# Patient Record
Sex: Female | Born: 1955 | Race: White | Hispanic: No | Marital: Married | Smoking: Current every day smoker
Health system: Southern US, Community
[De-identification: ages and names within clinical notes are randomized; demographics above are authoritative.]

## PROBLEM LIST (undated history)

## (undated) DIAGNOSIS — I10 Essential (primary) hypertension: Secondary | ICD-10-CM

## (undated) DIAGNOSIS — E785 Hyperlipidemia, unspecified: Secondary | ICD-10-CM

## (undated) DIAGNOSIS — M1990 Unspecified osteoarthritis, unspecified site: Secondary | ICD-10-CM

## (undated) DIAGNOSIS — J069 Acute upper respiratory infection, unspecified: Secondary | ICD-10-CM

## (undated) DIAGNOSIS — F32A Depression, unspecified: Secondary | ICD-10-CM

## (undated) DIAGNOSIS — J189 Pneumonia, unspecified organism: Secondary | ICD-10-CM

## (undated) DIAGNOSIS — J449 Chronic obstructive pulmonary disease, unspecified: Secondary | ICD-10-CM

## (undated) DIAGNOSIS — F419 Anxiety disorder, unspecified: Secondary | ICD-10-CM

## (undated) DIAGNOSIS — F329 Major depressive disorder, single episode, unspecified: Secondary | ICD-10-CM

## (undated) HISTORY — PX: APPENDECTOMY: SHX54

## (undated) HISTORY — PX: TONSILLECTOMY: SUR1361

## (undated) HISTORY — PX: REDUCTION MAMMAPLASTY: SUR839

## (undated) HISTORY — PX: CERVICAL DISC SURGERY: SHX588

---
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
## 2005-01-26 ENCOUNTER — Emergency Department (HOSPITAL_COMMUNITY): Admission: EM | Admit: 2005-01-26 | Discharge: 2005-01-27 | Payer: Self-pay | Admitting: Emergency Medicine

## 2005-02-01 ENCOUNTER — Encounter: Payer: Self-pay | Admitting: Family Medicine

## 2005-02-01 ENCOUNTER — Other Ambulatory Visit: Admission: RE | Admit: 2005-02-01 | Discharge: 2005-02-01 | Payer: Self-pay | Admitting: Family Medicine

## 2005-02-01 ENCOUNTER — Ambulatory Visit: Payer: Self-pay | Admitting: Family Medicine

## 2005-02-04 ENCOUNTER — Ambulatory Visit: Payer: Self-pay | Admitting: Family Medicine

## 2005-03-01 ENCOUNTER — Ambulatory Visit: Payer: Self-pay | Admitting: Family Medicine

## 2005-03-15 ENCOUNTER — Ambulatory Visit: Payer: Self-pay | Admitting: Family Medicine

## 2005-09-27 ENCOUNTER — Ambulatory Visit (HOSPITAL_COMMUNITY): Admission: RE | Admit: 2005-09-27 | Discharge: 2005-09-27 | Payer: Self-pay | Admitting: Neurosurgery

## 2005-10-02 ENCOUNTER — Ambulatory Visit: Payer: Self-pay | Admitting: Family Medicine

## 2005-10-22 DIAGNOSIS — Z720 Tobacco use: Secondary | ICD-10-CM | POA: Insufficient documentation

## 2005-10-22 DIAGNOSIS — M1990 Unspecified osteoarthritis, unspecified site: Secondary | ICD-10-CM | POA: Insufficient documentation

## 2005-10-22 DIAGNOSIS — F410 Panic disorder [episodic paroxysmal anxiety] without agoraphobia: Secondary | ICD-10-CM | POA: Insufficient documentation

## 2005-10-22 DIAGNOSIS — F329 Major depressive disorder, single episode, unspecified: Secondary | ICD-10-CM | POA: Insufficient documentation

## 2005-10-22 DIAGNOSIS — F17200 Nicotine dependence, unspecified, uncomplicated: Secondary | ICD-10-CM | POA: Insufficient documentation

## 2005-10-22 DIAGNOSIS — F339 Major depressive disorder, recurrent, unspecified: Secondary | ICD-10-CM

## 2006-02-03 ENCOUNTER — Ambulatory Visit: Payer: Self-pay | Admitting: Family Medicine

## 2006-02-04 ENCOUNTER — Telehealth: Payer: Self-pay | Admitting: Family Medicine

## 2006-02-06 ENCOUNTER — Encounter: Payer: Self-pay | Admitting: Family Medicine

## 2006-02-06 ENCOUNTER — Telehealth: Payer: Self-pay | Admitting: Family Medicine

## 2006-02-07 ENCOUNTER — Telehealth: Payer: Self-pay | Admitting: Family Medicine

## 2006-02-12 ENCOUNTER — Telehealth: Payer: Self-pay | Admitting: Family Medicine

## 2006-04-01 ENCOUNTER — Telehealth: Payer: Self-pay | Admitting: Family Medicine

## 2007-01-26 ENCOUNTER — Ambulatory Visit (HOSPITAL_COMMUNITY): Admission: RE | Admit: 2007-01-26 | Discharge: 2007-01-26 | Payer: Self-pay | Admitting: Neurosurgery

## 2007-01-27 ENCOUNTER — Telehealth: Payer: Self-pay | Admitting: Family Medicine

## 2007-03-20 ENCOUNTER — Telehealth: Payer: Self-pay | Admitting: Family Medicine

## 2007-03-26 ENCOUNTER — Ambulatory Visit: Payer: Self-pay | Admitting: Family Medicine

## 2007-03-26 LAB — CONVERTED CEMR LAB
Bilirubin Urine: NEGATIVE
Blood in Urine, dipstick: NEGATIVE
Glucose, Urine, Semiquant: NEGATIVE
Ketones, urine, test strip: NEGATIVE
Nitrite: NEGATIVE
Specific Gravity, Urine: 1.01

## 2007-05-06 ENCOUNTER — Telehealth: Payer: Self-pay | Admitting: Family Medicine

## 2007-05-06 ENCOUNTER — Telehealth (INDEPENDENT_AMBULATORY_CARE_PROVIDER_SITE_OTHER): Payer: Self-pay | Admitting: *Deleted

## 2007-05-08 ENCOUNTER — Telehealth (INDEPENDENT_AMBULATORY_CARE_PROVIDER_SITE_OTHER): Payer: Self-pay | Admitting: *Deleted

## 2007-12-30 ENCOUNTER — Ambulatory Visit: Payer: Self-pay | Admitting: Family Medicine

## 2007-12-31 ENCOUNTER — Encounter: Payer: Self-pay | Admitting: Family Medicine

## 2008-01-01 LAB — CONVERTED CEMR LAB
BUN: 11 mg/dL (ref 6–23)
CO2: 23 meq/L (ref 19–32)
Calcium: 9.3 mg/dL (ref 8.4–10.5)
Creatinine, Ser: 0.77 mg/dL (ref 0.40–1.20)
Glucose, Bld: 117 mg/dL — ABNORMAL HIGH (ref 70–99)
TSH: 2.92 microintl units/mL (ref 0.350–4.50)

## 2008-02-11 ENCOUNTER — Encounter: Admission: RE | Admit: 2008-02-11 | Discharge: 2008-02-11 | Payer: Self-pay | Admitting: Family Medicine

## 2008-02-11 ENCOUNTER — Ambulatory Visit: Payer: Self-pay | Admitting: Family Medicine

## 2008-02-12 ENCOUNTER — Telehealth: Payer: Self-pay | Admitting: Family Medicine

## 2008-02-29 ENCOUNTER — Telehealth: Payer: Self-pay | Admitting: Family Medicine

## 2008-03-11 ENCOUNTER — Telehealth: Payer: Self-pay | Admitting: Family Medicine

## 2008-03-21 ENCOUNTER — Ambulatory Visit: Payer: Self-pay | Admitting: Family Medicine

## 2008-03-21 ENCOUNTER — Telehealth: Payer: Self-pay | Admitting: Family Medicine

## 2008-03-21 DIAGNOSIS — J309 Allergic rhinitis, unspecified: Secondary | ICD-10-CM | POA: Insufficient documentation

## 2008-03-28 ENCOUNTER — Ambulatory Visit: Payer: Self-pay | Admitting: Family Medicine

## 2008-04-08 ENCOUNTER — Telehealth: Payer: Self-pay | Admitting: Family Medicine

## 2008-04-18 ENCOUNTER — Telehealth: Payer: Self-pay | Admitting: Family Medicine

## 2008-04-19 ENCOUNTER — Ambulatory Visit: Payer: Self-pay | Admitting: Family Medicine

## 2008-10-11 ENCOUNTER — Ambulatory Visit: Payer: Self-pay | Admitting: Family Medicine

## 2008-10-11 DIAGNOSIS — R5383 Other fatigue: Secondary | ICD-10-CM

## 2008-10-11 DIAGNOSIS — R5381 Other malaise: Secondary | ICD-10-CM | POA: Insufficient documentation

## 2008-10-12 ENCOUNTER — Encounter: Payer: Self-pay | Admitting: Family Medicine

## 2008-10-13 LAB — CONVERTED CEMR LAB
ALT: 13 units/L (ref 0–35)
AST: 15 units/L (ref 0–37)
Albumin: 4.4 g/dL (ref 3.5–5.2)
Alkaline Phosphatase: 79 units/L (ref 39–117)
Calcium: 9.4 mg/dL (ref 8.4–10.5)
Cholesterol, target level: 200 mg/dL
Folate: 14 ng/mL
HDL: 60 mg/dL (ref >39)
LDL Goal: 160 mg/dL
Sodium: 140 meq/L (ref 135–145)
Total Protein: 6.3 g/dL (ref 6.0–8.3)
Triglycerides: 109 mg/dL (ref <150)
VLDL: 22 mg/dL (ref 0–40)

## 2008-11-15 ENCOUNTER — Ambulatory Visit: Payer: Self-pay | Admitting: Family Medicine

## 2009-02-15 ENCOUNTER — Ambulatory Visit: Payer: Self-pay | Admitting: Family Medicine

## 2009-05-04 ENCOUNTER — Ambulatory Visit: Payer: Self-pay | Admitting: Family Medicine

## 2009-06-15 ENCOUNTER — Ambulatory Visit: Payer: Self-pay | Admitting: Family Medicine

## 2009-06-15 DIAGNOSIS — J441 Chronic obstructive pulmonary disease with (acute) exacerbation: Secondary | ICD-10-CM | POA: Insufficient documentation

## 2009-09-13 ENCOUNTER — Ambulatory Visit: Payer: Self-pay | Admitting: Family Medicine

## 2009-09-13 DIAGNOSIS — M5136 Other intervertebral disc degeneration, lumbar region: Secondary | ICD-10-CM | POA: Insufficient documentation

## 2009-09-13 DIAGNOSIS — S335XXA Sprain of ligaments of lumbar spine, initial encounter: Secondary | ICD-10-CM | POA: Insufficient documentation

## 2009-09-13 DIAGNOSIS — I872 Venous insufficiency (chronic) (peripheral): Secondary | ICD-10-CM | POA: Insufficient documentation

## 2009-09-13 LAB — CONVERTED CEMR LAB
Blood in Urine, dipstick: NEGATIVE
Glucose, Urine, Semiquant: NEGATIVE
WBC Urine, dipstick: NEGATIVE

## 2009-09-15 LAB — CONVERTED CEMR LAB
ALT: 12 units/L (ref 0–35)
AST: 16 units/L (ref 0–37)
Albumin: 4.4 g/dL (ref 3.5–5.2)
CO2: 30 meq/L (ref 19–32)
Chloride: 105 meq/L (ref 96–112)
Creatinine, Ser: 0.74 mg/dL (ref 0.40–1.20)
HCT: 42.9 % (ref 36.0–46.0)
Hemoglobin: 14.2 g/dL (ref 12.0–15.0)
MCHC: 33.1 g/dL (ref 30.0–36.0)
RBC: 4.26 M/uL (ref 3.87–5.11)
RDW: 13.4 % (ref 11.5–15.5)
TSH: 2.547 microintl units/mL (ref 0.350–4.500)
Total Bilirubin: 0.4 mg/dL (ref 0.3–1.2)
Total Protein: 6.3 g/dL (ref 6.0–8.3)
WBC: 5.2 10*3/uL (ref 4.0–10.5)

## 2009-12-13 ENCOUNTER — Ambulatory Visit: Payer: Self-pay | Admitting: Family Medicine

## 2010-02-12 ENCOUNTER — Telehealth (INDEPENDENT_AMBULATORY_CARE_PROVIDER_SITE_OTHER): Payer: Self-pay | Admitting: *Deleted

## 2010-02-13 NOTE — Assessment & Plan Note (Signed)
Summary: ankles sweeling and lower back pain   Vital Signs:  Patient profile:   55 year old female Height:      65.5 inches Weight:      217 pounds Pulse rate:   52 / minute BP sitting:   121 / 76  (right arm) Cuff size:   regular  Vitals Entered By: Avon Gully CMA, Duncan Dull) (September 13, 2009 8:58 AM) CC: lower back pain since last wed, ankles swelling since Friday up on feet and its worse after being on feet, they hurt also   Primary Care Provider:  Nani Gasser MD  CC:  lower back pain since last wed, ankles swelling since Friday up on feet and its worse after being on feet, and they hurt also.  History of Present Illness: lower back pain since last wed, ankles swelling since Friday up on feet and its worse after being on feet, they hurt also.  Has been doing alot of walking lately and then last Thurs Friday her ankles were uncomfortable and ove the weekend was swelling.  Seeing an ortho for her left knee tomorrow.  Low back pain started yesterday. Bilat low back pain.  Pain is worse if she is standing alot. Better if sitting.  Pain can be a 12/10.  Pain is constant. No throbbing. No fever.  Thinks maybe more SOB with walking long distances.  No cough.  WEight is up 10 lbs in the last 2 months.  No CP.  No palpitations.   Current Medications (verified): 1)  Prozac 40 Mg Caps (Fluoxetine Hcl) .... Take 1 Tablet By Mouth Once A Day 2)  Proair Hfa 108 (90 Base) Mcg/act Aers (Albuterol Sulfate) .... 2-4 Puff Inhaled Every 4-6 Hours As Needed Sob 3)  Symbicort 80-4.5 Mcg/act Aero (Budesonide-Formoterol Fumarate) .... 2 Puffs Inhaled Two Times A Day 4)  Fluticasone Propionate 50 Mcg/act Susp (Fluticasone Propionate) .... 2 Sprays in Each Nostril Daily  Allergies (verified): 1)  Pcn  Comments:  Nurse/Medical Assistant: The patient's medications and allergies were reviewed with the patient and were updated in the Medication and Allergy Lists. Avon Gully CMA, Duncan Dull)  (September 13, 2009 9:01 AM)  Past History:  Past Surgical History: Last updated: 12/30/2007 Ankle sx  1973, c/sec 1976, Left groin Hernia  1974 Cervical disc surgery (?laminectomy?)   Physical Exam  General:  Well-developed,well-nourished,in no acute distress; alert,appropriate and cooperative throughout examination Head:  Normocephalic and atraumatic without obvious abnormalities. No apparent alopecia or balding. Neck:  No deformities, masses, or tenderness noted. Lungs:  Normal respiratory effort, chest expands symmetrically. Lungs are clear to auscultation, no crackles or wheezes. Heart:  Normal rate and regular rhythm. S1 and S2 normal without gallop, murmur, click, rub or other extra sounds. No carotid bruits.  Msk:  Normal flexion, extension, side bending of the low back. [place]edner over the lumbar spine.  Tender over teh lumbar paraspinous muscles.  Mild bilat CVA tenderness.  Extremities:  1 + edema to below the left knee. trace edema in the right ankle.  Neurologic:  1+ reflexes in UE and LE.  Skin:  no rashes.   Cervical Nodes:  No lymphadenopathy noted Psych:  Cognition and judgment appear intact. Alert and cooperative with normal attention span and concentration. No apparent delusions, illusions, hallucinations   Impression & Recommendations:  Problem # 1:  UNSPECIFIED VENOUS INSUFFICIENCY (ICD-459.81) Assessment New With acute onset will rule out electrolyte d/o, CHF, thyroid abnormalities, liver and kidney problems, no UTI. For now avoid salt,  elevate feet when can and will give lasix to use for the next 3 days and see if feeling better. likley VS worsening by prolonged standing and the summer heat.   Orders: T-Comprehensive Metabolic Panel [id number]) T-TSH [phone]) T-CBC No Diff ([id number]) T-BNP  (B Natriuretic Peptide) (20254-27062)  Problem # 2:  LUMBAR STRAIN (ICD-847.2) Assessment: New I really think her back pain is separate. UA is neg.  Trial of  muscle relaxer and stretches to see if helps.   Orders: UA Dipstick w/o Micro (automated)  (81003)  Complete Medication List: 1)  Prozac 40 Mg Caps (Fluoxetine hcl) .... Take 1 tablet by mouth once a day 2)  Proair Hfa 108 (90 Base) Mcg/act Aers (Albuterol sulfate) .... 2-4 puff inhaled every 4-6 hours as needed sob 3)  Symbicort 80-4.5 Mcg/act Aero (Budesonide-formoterol fumarate) .... 2 puffs inhaled two times a day 4)  Fluticasone Propionate 50 Mcg/act Susp (Fluticasone propionate) .... 2 sprays in each nostril daily 5)  Flexeril 10 Mg Tabs (Cyclobenzaprine hcl) .... Take 1 tablet by mouth two times a day as needed muscle pain 6)  Lasix 40 Mg Tabs (Furosemide) .... Take 1 tablet by mouth once a day as needed swelling  Patient Instructions: 1)  Trial of muscle relaxer and stretches to see if helps.  2)  We will call you with the lab results. Prescriptions: LASIX 40 MG TABS (FUROSEMIDE) Take 1 tablet by mouth once a day as needed swelling  #10 x 0   Entered and Authorized by:   Nani Gasser MD   Signed by:   Nani Gasser MD on 09/13/2009   Method used:   Electronically to        [facility] [phone]* (retail)       [address]       [place], [place]  [address]       Ph: [phone]       Fax: [phone]   RxID:   [id number] FLEXERIL 10 MG TABS (CYCLOBENZAPRINE HCL) Take 1 tablet by mouth two times a day as needed muscle pain  #30 x 0   Entered and Authorized by:   Nani Gasser MD   Signed by:   Nani Gasser MD on 09/13/2009   Method used:   Electronically to        [facility] [phone]* (retail)       [address]       [place], [place]  [address]       Ph: [phone]       Fax: [phone]   RxID:   [id number]   Laboratory Results   Urine Tests  Date/Time Received: 09/13/2009 Date/Time Reported: 09/13/2009  Routine Urinalysis   Color: yellow Appearance: Clear Glucose: negative   (Normal Range: Negative) Bilirubin:  negative   (Normal Range: Negative) Ketone: negative   (Normal Range: Negative) Spec. Gravity: 1.015   (Normal Range: 1.003-1.035) Blood: negative   (Normal Range: Negative) pH: 8.5   (Normal Range: 5.0-8.0) Protein: negative   (Normal Range: Negative) Urobilinogen: 0.2   (Normal Range: 0-1) Nitrite: negative   (Normal Range: Negative) Leukocyte Esterace: negative   (Normal Range: Negative)

## 2010-02-13 NOTE — Assessment & Plan Note (Signed)
Summary: URI   Vital Signs:  Patient profile:   55 year old female Height:      65.5 inches Weight:      200 pounds BMI:     32.89 O2 Sat:      96 % on Room air Temp:     98.2 degrees F oral Pulse rate:   67 / minute BP sitting:   126 / 80  (left arm) Cuff size:   large  Vitals Entered By: Kathlene November (February 15, 2009 9:42 AM)  O2 Flow:  Room air  Serial Vital Signs/Assessments:                                PEF    PreRx  PostRx Time      O2 Sat  O2 Type     L/min  L/min  L/min   By 10:08 AM                      280    280    280     Kim Johnson 10:20 AM                      270    310    310     Kim Johnson  Comments: 10:08 AM pt in yellow zone By: Kathlene November  10:20 AM Pt in yellow zone By: Kathlene November   CC: sinus congestion, chest congestion, cough, H/A, eyes itchy and watering. Started 2 days ago   Primary Care Provider:  Linford Arnold, C  CC:  sinus congestion, chest congestion, cough, H/A, and eyes itchy and watering. Started 2 days ago.  History of Present Illness: sinus congestion, chest congestion, cough, H/A, eyes itchy and watering. Started 4-5 days ago. Chest congestion started today. Productive cough.  No fever.  Some bodyaches. + flu exposure but she had her flu shot.  taking Advil cold and sinus.  Started with right ear pain and maxillary sinus pain.  Hasn't smoked in 3 weeks. Feels extremely tired.  Restarted her symbicort and nasal spray 5 days ago. Treated for bronchitis for November.   Current Medications (verified): 1)  Prozac 40 Mg Caps (Fluoxetine Hcl) .... Take 1 Tablet By Mouth Once A Day 2)  Proair Hfa 108 (90 Base) Mcg/act Aers (Albuterol Sulfate) .... 2-4 Puff Inhaled Every 4-6 Hours As Needed Sob 3)  Trazodone Hcl 50 Mg Tabs (Trazodone Hcl) .... Take 1 Tablet By Mouth Once A Day At Bedtime For Insomnia 4)  Symbicort 80-4.5 Mcg/act Aero (Budesonide-Formoterol Fumarate) .... 2 Puffs Inhaled Two Times A Day 5)  Fluticasone Propionate 50  Mcg/act Susp (Fluticasone Propionate) .... 2 Sprays in Each Nostril Daily 6)  Naproxen 500 Mg Tabs (Naproxen) .... Take 1 Tablet By Mouth Two Times A Day 7)  Flexeril 10 Mg Tabs (Cyclobenzaprine Hcl) .... Take 1 Tablet By Mouth Three Times A Day As Needed For Muscle Spams.  Allergies (verified): 1)  Pcn  Comments:  Nurse/Medical Assistant: The patient's medications and allergies were reviewed with the patient and were updated in the Medication and Allergy Lists. Kathlene November (February 15, 2009 9:44 AM)  Social History: Reviewed history from 12/30/2007 and no changes required. 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  Married to [place].  Lives with husband, mother, and grandson.  Smokes 1/2 ppd for 35 yrs.  No EtOH, no drugs, 3  caffeinated drinks, no regular exercise.  Physical Exam  General:  Well-developed,well-nourished,in no acute distress; alert,appropriate and cooperative throughout examination. Appears fatigued.  Head:  Normocephalic and atraumatic without obvious abnormalities. No apparent alopecia or balding. Eyes:  Sclera are inflammed. Lids wih midl edema. Clear runny nose.  Ears:  External ear exam shows no significant lesions or deformities.  Otoscopic examination reveals clear canals, tympanic membranes are intact bilaterally without bulging, retraction, inflammation or discharge. Hearing is grossly normal bilaterally. Nose:  External nasal examination shows no deformity or inflammation. Mouth:  Oral mucosa and oropharynx without lesions or exudates.  Teeth in good repair. Neck:  No deformities, masses, or tenderness noted. Lungs:  Normal respiratory effort, chest expands symmetrically. Prolonged expiration at the bases bilat with some wheeze.  Much improved post neb.  Heart:  Normal rate and regular rhythm. S1 and S2 normal without gallop, murmur, click, rub or other extra sounds. Pulses:  Radial 2+  Neurologic:  alert & oriented X3.   Skin:  no rashes.   Cervical  Nodes:  No lymphadenopathy noted Psych:  Cognition and judgment appear intact. Alert and cooperative with normal attention span and concentration. No apparent delusions, illusions, hallucinations   Impression & Recommendations:  Problem # 1:  ACUTE BRONCHITIS (ICD-466.0) Still possiblly viral. Try to hold off on abx for a couple of days to see fif feels better.  New rx given for alubterol vials. Use neb up to 3 x a day as needed for SOB or chest tightness. Symptomatic care. Continue the sybicort and proair as needed . Fill rx for ABX if not better in a couple of days. Still need to schedule  spirometry when feeling better.  Her updated medication list for this problem includes:    Proair Hfa 108 (90 Base) Mcg/act Aers (Albuterol sulfate) .Marland Kitchen... 2-4 puff inhaled every 4-6 hours as needed sob    Symbicort 80-4.5 Mcg/act Aero (Budesonide-formoterol fumarate) .Marland Kitchen... 2 puffs inhaled two times a day    Doxycycline Hyclate 100 Mg Tabs (Doxycycline hyclate) .Marland Kitchen... Take 1 tablet by mouth two times a day for 10 days    Albuterol Sulfate (2.5 Mg/51ml) 0.083% Nebu (Albuterol sulfate) ..... One neb eveyr 4-6 hours as needed  Orders: Albuterol Sulfate Sol 1mg  unit dose (I9485) Nebulizer Tx (46270) Peak Flow Rate ([address])  Take antibiotics and other medications as directed. Encouraged to push clear liquids, get enough rest, and take acetaminophen as needed. To be seen in 5-7 days if no improvement, sooner if worse.  Complete Medication List: 1)  Prozac 40 Mg Caps (Fluoxetine hcl) .... Take 1 tablet by mouth once a day 2)  Proair Hfa 108 (90 Base) Mcg/act Aers (Albuterol sulfate) .... 2-4 puff inhaled every 4-6 hours as needed sob 3)  Trazodone Hcl 50 Mg Tabs (Trazodone hcl) .... Take 1 tablet by mouth once a day at bedtime for insomnia 4)  Symbicort 80-4.5 Mcg/act Aero (Budesonide-formoterol fumarate) .... 2 puffs inhaled two times a day 5)  Fluticasone Propionate 50 Mcg/act Susp (Fluticasone propionate) ....  2 sprays in each nostril daily 6)  Naproxen 500 Mg Tabs (Naproxen) .... Take 1 tablet by mouth two times a day 7)  Flexeril 10 Mg Tabs (Cyclobenzaprine hcl) .... Take 1 tablet by mouth three times a day as needed for muscle spams. 8)  Doxycycline Hyclate 100 Mg Tabs (Doxycycline hyclate) .... Take 1 tablet by mouth two times a day for 10 days 9)  Albuterol Sulfate (2.5 Mg/73ml) 0.083% Nebu (Albuterol sulfate) .... One  neb eveyr 4-6 hours as needed Prescriptions: DOXYCYCLINE HYCLATE 100 MG TABS (DOXYCYCLINE HYCLATE) Take 1 tablet by mouth two times a day for 10 days  #20 x 0   Entered and Authorized by:   Nani Gasser MD   Signed by:   Nani Gasser MD on 02/15/2009   Method used:   Electronically to        [facility] [phone]* (retail)       [address]       [place], [place]  [address]       Ph: [phone]       Fax: [phone]   RxID:   [id number] ALBUTEROL SULFATE (2.5 MG/3ML) 0.083% NEBU (ALBUTEROL SULFATE) one NEB eveyr 4-6 hours as needed  #30 day suppl x 0   Entered and Authorized by:   Nani Gasser MD   Signed by:   Nani Gasser MD on 02/15/2009   Method used:   Electronically to        [facility] [phone]* (retail)       [address]       [place], [place]  [address]       Ph: [phone]       Fax: [phone]   RxID:   [id number]    Medication Administration  Medication # 1:    Medication: Albuterol Sulfate Sol 1mg  unit dose    Diagnosis: ACUTE BRONCHITIS (ICD-466.0)    Dose: 2.5mg     Route: inhaled    Exp Date: 09/15/2010    Lot #: [id number]    Mfr: nephropn    Patient tolerated medication without complications    Given by: Kathlene November (February 15, 2009 10:17 AM)  Orders Added: 1)  Albuterol Sulfate Sol 1mg  unit dose [J7613] 2)  Nebulizer Tx [94640] 3)  Est. Patient Level IV [09323] 4)  Peak Flow Rate [94150]

## 2010-02-13 NOTE — Assessment & Plan Note (Signed)
Summary: URI/ wheezing   Vital Signs:  Patient profile:   55 year old female Height:      65.5 inches Weight:      207 pounds BMI:     34.05 O2 Sat:      95 % on Room air Pulse rate:   59 / minute BP sitting:   123 / 73  (left arm) Cuff size:   large  Vitals Entered By: Payton Spark CMA (June 15, 2009 11:45 AM)  O2 Flow:  Room air  Serial Vital Signs/Assessments:  Comments: 11:49 AM Peak Flow [address] By: Payton Spark CMA   CC: B arms and hands cramping x 3 days. Also has cough w/ wheeze.    Primary Care Provider:  Nani Gasser MD  CC:  B arms and hands cramping x 3 days. Also has cough w/ wheeze. Marland Kitchen  History of Present Illness: 54 yo WF presents for 3 days of cough and congestion.  She is using her inhaler and her nasal sprays but it is not getting better.  She is having cramping in her arms and hands and also her legs.  She had a normal BMP last visit.  She is drinking lots of fluids.  She is not on a diuretic.  Has some allergies and undiagnosed COPD.  She has been taking Advil Cold and sinus but not much relief for nasal congestion, rhinorrhea or HAs.  Denies fevers, chills, sputum production, CP or SOB.    Current Medications (verified): 1)  Prozac 40 Mg Caps (Fluoxetine Hcl) .... Take 1 Tablet By Mouth Once A Day 2)  Proair Hfa 108 (90 Base) Mcg/act Aers (Albuterol Sulfate) .... 2-4 Puff Inhaled Every 4-6 Hours As Needed Sob 3)  Symbicort 80-4.5 Mcg/act Aero (Budesonide-Formoterol Fumarate) .... 2 Puffs Inhaled Two Times A Day 4)  Fluticasone Propionate 50 Mcg/act Susp (Fluticasone Propionate) .... 2 Sprays in Each Nostril Daily  Allergies (verified): 1)  Pcn  Past History:  Past Medical History: Reviewed history from 12/30/2007 and no changes required. Cervical Stenosis C4-T1, G1 P1   Social History: Reviewed history from 12/30/2007 and no changes required. 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  Lives with  husband, mother, and grandson.  Smokes 1/2 ppd for 35 yrs.  No EtOH, no drugs, 3 caffeinated drinks, no regular exercise.  Review of Systems      See HPI  Physical Exam  General:  alert, well-developed, well-nourished, and well-hydrated.  obese Head:  normocephalic and atraumatic.  maxillary sinuses TTP Eyes:  conjunctiva mildly injected and watery Nose:  clear rhinorrhea Mouth:  o/p pink and moist with cobblestoning and clear postnasal drip Neck:  no masses.   Lungs:  rhonchi/ exp wheeze bibasilar with cough.  nonlabored. Heart:  normal rate, regular rhythm, and no murmur.   Extremities:  no LE edema Skin:  color normal.   Cervical Nodes:  No lymphadenopathy noted Psych:  good eye contact, not anxious appearing, and not depressed appearing.     Impression & Recommendations:  Problem # 1:  CHRONIC OBSTRUCTIVE PULMONARY DISEASE, ACUTE EXACERBATION (ICD-491.21)  Secondary to URI, smoker with undiagnosed COPD. Treat with Prednisone 60 mg/ day x 5 days + Zithromax x 5 days. Use Symbicort two times a day and ProAir HFA 2 puffs 4 x a day. She did quit smoking 2 mos ago.    Call if not improved in 7 days.  Orders: Peak Flow Rate ([address])  Problem # 2:  URI (ICD-465.9) OK to use OTC Delsym for cough with Zyrtec - D  for congestion.  Complete Medication List: 1)  Prozac 40 Mg Caps (Fluoxetine hcl) .... Take 1 tablet by mouth once a day 2)  Proair Hfa 108 (90 Base) Mcg/act Aers (Albuterol sulfate) .... 2-4 puff inhaled every 4-6 hours as needed sob 3)  Symbicort 80-4.5 Mcg/act Aero (Budesonide-formoterol fumarate) .... 2 puffs inhaled two times a day 4)  Fluticasone Propionate 50 Mcg/act Susp (Fluticasone propionate) .... 2 sprays in each nostril daily 5)  Prednisone 20 Mg Tabs (Prednisone) .... 3 tabs by mouth once a day x 5 days 6)  Zithromax Z-pak 250 Mg Tabs (Azithromycin) .... 2 tab by mouth x 1 day then 1 tab by mouth daily x 4 days  Patient Instructions: 1)  Take 5 days of  Zithromax + 5 days of Prednisone. 2)  Stay on Symbicort 2 x a day. 3)  Use ProAir 2 puffs 4 x a day for the next 10 days. 4)  Use Zyrtec - D  once a day for allergies and congestion along with Delsym as needed for cough. 5)  Call if not improving in 7 days. Prescriptions: ZITHROMAX Z-PAK 250 MG TABS (AZITHROMYCIN) 2 tab by mouth x 1 day then 1 tab by mouth daily x 4 days  #1 pack x 0   Entered and Authorized by:   Seymour Bars DO   Signed by:   Seymour Bars DO on 06/15/2009   Method used:   Electronically to        [facility] [phone]* (retail)       [address]       [place], [place]  [address]       Ph: [phone]       Fax: [phone]   RxID:   [id number] PREDNISONE 20 MG TABS (PREDNISONE) 3 tabs by mouth once a day x 5 days  #15 x 0   Entered and Authorized by:   Seymour Bars DO   Signed by:   Seymour Bars DO on 06/15/2009   Method used:   Electronically to        [facility] [phone]* (retail)       [address]       [place], [place]  [address]       Ph: [phone]       Fax: [phone]   RxID:   [id number]

## 2010-02-13 NOTE — Assessment & Plan Note (Signed)
Summary: POISON OAK   Vital Signs:  Patient profile:   55 year old female Height:      65.5 inches Weight:      204 pounds BMI:     33.55 O2 Sat:      95 % on Room air Pulse rate:   80 / minute BP sitting:   136 / 87  (left arm) Cuff size:   large  Vitals Entered By: Payton Spark CMA (May 04, 2009 10:35 AM)  O2 Flow:  Room air CC: ? Poison Oak around eyes x 2 days.   Primary Care Provider:  Cipriano Bunker  CC:  ? Poison Oak around eyes x 2 days.Marland Kitchen  History of Present Illness: ? Poison Oak around eyes x 2 days.  Current Medications (verified): 1)  Prozac 40 Mg Caps (Fluoxetine Hcl) .... Take 1 Tablet By Mouth Once A Day 2)  Proair Hfa 108 (90 Base) Mcg/act Aers (Albuterol Sulfate) .... 2-4 Puff Inhaled Every 4-6 Hours As Needed Sob 3)  Symbicort 80-4.5 Mcg/act Aero (Budesonide-Formoterol Fumarate) .... 2 Puffs Inhaled Two Times A Day 4)  Fluticasone Propionate 50 Mcg/act Susp (Fluticasone Propionate) .... 2 Sprays in Each Nostril Daily 5)  Albuterol Sulfate (2.5 Mg/84ml) 0.083% Nebu (Albuterol Sulfate) .... One [employer] 4-6 Hours As Needed  Allergies (verified): 1)  Pcn  Physical Exam  General:  Well-developed,well-nourished,in no acute distress; alert,appropriate and cooperative throughout examination Skin:  Erythematous papules  in a linear fashion on her forehead and on her eyelids.  Has a few erythematous papules on her forearms as well.    Impression & Recommendations:  Problem # 1:  POISON IVY DERMATITIS (ICD-692.6)  Discussed options of no treatment as it will resolve on its own. vs steroid injection. I don't recommend topical steroid as it is on her eyelids and it is not safe to apply this around the eyes.  Pt opted for injection as says the itching it driving her crazy. Depo medrol 60mg  IM given.  Pt to call if sxs start to recur after 5 days.   Orders: Depo- Medrol 40mg  (J1030) Depo-Medrol 20mg  (J1020) Admin of Therapeutic Inj  intramuscular or  subcutaneous (91478)  Complete Medication List: 1)  Prozac 40 Mg Caps (Fluoxetine hcl) .... Take 1 tablet by mouth once a day 2)  Proair Hfa 108 (90 Base) Mcg/act Aers (Albuterol sulfate) .... 2-4 puff inhaled every 4-6 hours as needed sob 3)  Symbicort 80-4.5 Mcg/act Aero (Budesonide-formoterol fumarate) .... 2 puffs inhaled two times a day 4)  Fluticasone Propionate 50 Mcg/act Susp (Fluticasone propionate) .... 2 sprays in each nostril daily 5)  Albuterol Sulfate (2.5 Mg/43ml) 0.083% Nebu (Albuterol sulfate) .... One neb eveyr 4-6 hours as needed  Patient Instructions: 1)  Call if sxs recur in 5 days 2)  Can use OTC Benadryl   Medication Administration  Injection # 1:    Medication: Depo- Medrol 40mg     Diagnosis: POISON IVY DERMATITIS (ICD-692.6)    Route: IM    Site: LUOQ gluteus    Patient tolerated injection without complications    Given by: Payton Spark CMA (May 04, 2009 10:58 AM)  Injection # 2:    Medication: Depo-Medrol 20mg     Diagnosis: POISON IVY DERMATITIS (ICD-692.6)  Orders Added: 1)  Est. Patient Level III [29562] 2)  Depo- Medrol 40mg  [J1030] 3)  Depo-Medrol 20mg  [J1020] 4)  Admin of Therapeutic Inj  intramuscular or subcutaneous [13086]

## 2010-02-13 NOTE — Assessment & Plan Note (Signed)
Summary: Sinusitis, COPD   Vital Signs:  Patient profile:   55 year old female Height:      65.5 inches Weight:      214 pounds Temp:     98.2 degrees F oral Pulse rate:   77 / minute BP sitting:   134 / 87  (right arm) Cuff size:   regular  Vitals Entered By: Avon Gully CMA, Duncan Dull) (December 13, 2009 9:45 AM) CC: congestion since monday,fever last pm 101   Primary Care Provider:  Nani Gasser MD  CC:  congestion since monday and fever last pm 101.  History of Present Illness: Nasal congestion since monday,fever last pm 101.  No sxs last week.  Had diarrhea and stomach cramping about 5 days ago and that is better.  Feels weak and puny.  Head is painful to lay flat at night.  Pain at the base of the skull and across the eyes. No tooth pain. Throbbong pain in face when bends to tie her shoe.  Can't hear out of her right ear.  No ST or cough. Feels achy all over.  Started using her nasal spray and inhaler.  Also took some mucinex D but not really helping.No SOB.    Current Medications (verified): 1)  Prozac 40 Mg Caps (Fluoxetine Hcl) .... Take 1 Tablet By Mouth Once A Day 2)  Proair Hfa 108 (90 Base) Mcg/act Aers (Albuterol Sulfate) .... 2-4 Puff Inhaled Every 4-6 Hours As Needed Sob 3)  Symbicort 80-4.5 Mcg/act Aero (Budesonide-Formoterol Fumarate) .... 2 Puffs Inhaled Two Times A Day 4)  Fluticasone Propionate 50 Mcg/act Susp (Fluticasone Propionate) .... 2 Sprays in Each Nostril Daily 5)  Flexeril 10 Mg Tabs (Cyclobenzaprine Hcl) .... Take 1 Tablet By Mouth Two Times A Day As Needed Muscle Pain  Allergies (verified): 1)  Pcn  Comments:  Nurse/Medical Assistant: The patient's medications and allergies were reviewed with the patient and were updated in the Medication and Allergy Lists. Avon Gully CMA, Duncan Dull) (December 13, 2009 9:46 AM)  Physical Exam  General:  Well-developed,well-nourished,in no acute distress; alert,appropriate and cooperative throughout  examination Head:  Normocephalic and atraumatic without obvious abnormalities. No apparent alopecia or balding. Eyes:  No corneal or conjunctival inflammation noted. EOMI. Perrla.  Ears:  External ear exam shows no significant lesions or deformities.  Otoscopic examination reveals clear canals, tympanic membranes are intact bilaterally without bulging, retraction, inflammation or discharge. Hearing is grossly normal bilaterally. Nose:  External nasal examination shows no deformity or inflammation. Nasal mucosa are pink and moist without lesions or exudates. Mouth:  Oral mucosa and oropharynx without lesions or exudates.  Teeth in good repair. Neck:  No deformities, masses, or tenderness noted. Lungs:  Normal respiratory effort, chest expands symmetrically. Lungs are clear to auscultation,Wheeze on the right lung. Didn't clear with cough.  Heart:  Normal rate and regular rhythm. S1 and S2 normal without gallop, murmur, click, rub or other extra sounds.   Impression & Recommendations:  Problem # 1:  SINUSITIS - ACUTE-NOS (ICD-461.9)  I suspect this is viral but since having 3 days of fever gave her a rx. Recommend she give this 1-2 days to see if the fever breaks before deciding to fill the ABX. Call if not better in one week.  Her updated medication list for this problem includes:    Fluticasone Propionate 50 Mcg/act Susp (Fluticasone propionate) .Marland Kitchen... 2 sprays in each nostril daily    Doxycycline Hyclate 100 Mg Caps (Doxycycline hyclate) .Marland Kitchen... Take 1 tablet  by mouth two times a day for 10 days  Instructed on treatment. Call if symptoms persist or worsen.   Problem # 2:  CHRONIC OBSTRUCTIVE PULMONARY DISEASE, ACUTE EXACERBATION (ICD-491.21) I do hear a wheeze on the right side so I do recommend using her Albuterol three times a day for a few days. Call if any Chest Pain, SOB, or cough develops., or if fever doesn't resolved.   Complete Medication List: 1)  Prozac 40 Mg Caps (Fluoxetine hcl)  .... Take 1 tablet by mouth once a day 2)  Proair Hfa 108 (90 Base) Mcg/act Aers (Albuterol sulfate) .... 2-4 puff inhaled every 4-6 hours as needed sob 3)  Symbicort 80-4.5 Mcg/act Aero (Budesonide-formoterol fumarate) .... 2 puffs inhaled two times a day 4)  Fluticasone Propionate 50 Mcg/act Susp (Fluticasone propionate) .... 2 sprays in each nostril daily 5)  Flexeril 10 Mg Tabs (Cyclobenzaprine hcl) .... Take 1 tablet by mouth two times a day as needed muscle pain 6)  Doxycycline Hyclate 100 Mg Caps (Doxycycline hyclate) .... Take 1 tablet by mouth two times a day for 10 days  Patient Instructions: 1)  Call if not better in one week.  Prescriptions: PROZAC 40 MG CAPS (FLUOXETINE HCL) Take 1 tablet by mouth once a day  #90 x 0   Entered and Authorized by:   Nani Gasser MD   Signed by:   Nani Gasser MD on 12/13/2009   Method used:   Electronically to        [facility] [phone]* (retail)       [address]       [place], [place]  [address]       Ph: [phone]       Fax: [phone]   RxID:   [id number] DOXYCYCLINE HYCLATE 100 MG CAPS (DOXYCYCLINE HYCLATE) Take 1 tablet by mouth two times a day for 10 days  #20 x 0   Entered and Authorized by:   Nani Gasser MD   Signed by:   Nani Gasser MD on 12/13/2009   Method used:   Electronically to        [facility] [phone]* (retail)       [address]       [place], [place]  [address]       Ph: [phone]       Fax: [phone]   RxID:   [id number] FLUTICASONE PROPIONATE 50 MCG/ACT SUSP (FLUTICASONE PROPIONATE) 2 sprays in each nostril daily  #1 x 4   Entered and Authorized by:   Nani Gasser MD   Signed by:   Nani Gasser MD on 12/13/2009   Method used:   Electronically to        [facility] [phone]* (retail)       [address]       [place], [place]  [address]       Ph: [phone]       Fax: [phone]   RxID:   [id number] PROAIR HFA 108 (90 BASE)  MCG/ACT AERS (ALBUTEROL SULFATE) 2-4 puff inhaled every 4-6 hours as needed SOB  #1 x 1   Entered and Authorized by:   Nani Gasser MD   Signed by:   Nani Gasser MD on 12/13/2009   Method used:   Electronically to        [facility] [phone]* (retail)       [address]       [place],  [place]  [address]       Ph: [phone]       Fax: [phone]   RxID:   [id number] SYMBICORT 80-4.5 MCG/ACT AERO (BUDESONIDE-FORMOTEROL FUMARATE) 2 puffs inhaled two times a day  #1 x 4   Entered and Authorized by:   Nani Gasser MD   Signed by:   Nani Gasser MD on 12/13/2009   Method used:   Electronically to        [facility] [phone]* (retail)       [address]       [place], [place]  [address]       Ph: [phone]       Fax: [phone]   RxID:   [id number]    Orders Added: 1)  Est. Patient Level IV [[address]]

## 2010-02-21 NOTE — Progress Notes (Signed)
Summary: Due for mammo  Phone Note Outgoing Call   Summary of Call: Call pt and remind her she is due for her mammogarm.  Initial call taken by: Nani Gasser MD,  February 12, 2010 10:25 AM  Follow-up for Phone Call        Pt aware of the above Follow-up by: Payton Spark CMA,  February 13, 2010 9:47 AM

## 2010-03-01 ENCOUNTER — Encounter: Payer: Self-pay | Admitting: Family Medicine

## 2010-03-01 ENCOUNTER — Telehealth: Payer: Self-pay | Admitting: Family Medicine

## 2010-03-01 ENCOUNTER — Ambulatory Visit (INDEPENDENT_AMBULATORY_CARE_PROVIDER_SITE_OTHER): Payer: Commercial Indemnity | Admitting: Family Medicine

## 2010-03-01 DIAGNOSIS — J019 Acute sinusitis, unspecified: Secondary | ICD-10-CM

## 2010-03-01 DIAGNOSIS — F339 Major depressive disorder, recurrent, unspecified: Secondary | ICD-10-CM

## 2010-03-07 NOTE — Progress Notes (Signed)
Summary: meds  Phone Note Call from Patient   Caller: Patient Call For: Nani Gasser MD Summary of Call: pt called and states the serequel is too expensive and wants to know if something cheaper can be called in Initial call taken by: Avon Gully CMA, Duncan Dull),  March 01, 2010 3:14 PM  Follow-up for Phone Call        we will change her to Prozac 80 mg.  This should be much more affordable. Follow-up by: Nani Gasser MD,  March 01, 2010 5:26 PM  Additional Follow-up for Phone Call Additional follow up Details #1::        pt notifed Additional Follow-up by: Avon Gully CMA, Duncan Dull),  March 02, 2010 10:14 AM    New/Updated Medications: PROZAC 40 MG CAPS (FLUOXETINE HCL) Take 2  tablet by mouth once a day Prescriptions: PROZAC 40 MG CAPS (FLUOXETINE HCL) Take 2  tablet by mouth once a day  #60 x 0   Entered and Authorized by:   Nani Gasser MD   Signed by:   Nani Gasser MD on 03/01/2010   Method used:   Electronically to        [facility] [phone]* (retail)       [address]       [place], [place]  [address]       Ph: [phone]       Fax: [phone]   RxID:   [id number]

## 2010-03-07 NOTE — Assessment & Plan Note (Signed)
Summary: Sinusitis, depresson   Vital Signs:  Patient profile:   55 year old female Height:      65.5 inches Weight:      209 pounds Temp:     98.6 degrees F oral Pulse rate:   71 / minute BP sitting:   135 / 84  (right arm) Cuff size:   regular  Vitals Entered By: Avon Gully CMA, Duncan Dull) (March 01, 2010 10:56 AM) CC: sinus pressure and congestion on and off since NOv   Primary Care Provider:  Nani Gasser MD  CC:  sinus pressure and congestion on and off since NOv.  History of Present Illness: Has lost 5 lbs.  sinus pressure and congestion on and off since NOv. Aslo have pain in both jaw and soreness in her teeth. No fever. Also still some cough adn occ sounds wet.  NOt taking anything for it right now. No alleviating or worsening factors.   Mom has dx with alzheimers and dementia and her son moved back home so this is very stressful.  She is still taking her prozac at night.  Sleep is ok but hard time falling asleep. Feels she needs somthing else for her mood.  Says emotionally she is really struggling and is tearful. Says her sister is getting counseling and this has been helpful for her.   Current Medications (verified): 1)  Prozac 40 Mg Caps (Fluoxetine Hcl) .... Take 1 Tablet By Mouth Once A Day 2)  Proair Hfa 108 (90 Base) Mcg/act Aers (Albuterol Sulfate) .... 2-4 Puff Inhaled Every 4-6 Hours As Needed Sob 3)  Symbicort 80-4.5 Mcg/act Aero (Budesonide-Formoterol Fumarate) .... 2 Puffs Inhaled Two Times A Day 4)  Fluticasone Propionate 50 Mcg/act Susp (Fluticasone Propionate) .... 2 Sprays in Each Nostril Daily  Allergies (verified): 1)  Pcn  Comments:  Nurse/Medical Assistant: The patient's medications and allergies were reviewed with the patient and were updated in the Medication and Allergy Lists. Avon Gully CMA, Duncan Dull) (March 01, 2010 10:57 AM)  Family History:  Father-alcoholism, DM Mother - HTN, depresssion, alzheimers  pancreatic  cancer-GM  Social History: Reviewed history from 12/30/2007 and no changes required. 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  Married to [place].  Lives with husband, mother, and grandson.  Smokes 1/2 ppd for 35 yrs.  No EtOH, no drugs, 3 caffeinated drinks, no regular exercise.  Physical Exam  General:  Well-developed,well-nourished,in no acute distress; alert,appropriate and cooperative throughout examination Head:  Normocephalic and atraumatic without obvious abnormalities. No apparent alopecia or balding. Eyes:  No corneal or conjunctival inflammation noted. EOMI. Perrla. Ears:  External ear exam shows no significant lesions or deformities.  Otoscopic examination reveals clear canals, tympanic membranes are intact bilaterally without bulging, retraction, inflammation or discharge. Hearing is grossly normal bilaterally. Nose:  External nasal examination shows no deformity or inflammation. Mouth:  Oral mucosa and oropharynx without lesions or exudates.  Teeth in good repair. Neck:  No deformities, masses, or tenderness noted. Lungs:  Normal respiratory effort, chest expands symmetrically. Coarse wheezing at the bases bilaterally.  Heart:  Normal rate and regular rhythm. S1 and S2 normal without gallop, murmur, click, rub or other extra sounds. Skin:  no rashes.   Cervical Nodes:  No lymphadenopathy noted Psych:  Cognition and judgment appear intact. Alert and cooperative with normal attention span and concentration. No apparent delusions, illusions, hallucinations   Impression & Recommendations:  Problem # 1:  SINUSITIS - ACUTE-NOS (ICD-461.9) Assessment Deteriorated  The following medications were removed from the  medication list:    Doxycycline Hyclate 100 Mg Caps (Doxycycline hyclate) .Marland Kitchen... Take 1 tablet by mouth two times a day for 10 days Her updated medication list for this problem includes:    Fluticasone Propionate 50 Mcg/act Susp (Fluticasone propionate) .Marland Kitchen... 2  sprays in each nostril daily    Bactrim Ds 800-160 Mg Tabs (Sulfamethoxazole-trimethoprim) .Marland Kitchen... Take 1 tablet by mouth two times a day for 14 days  Instructed on treatment. Call if symptoms persist or worsen. Recheck in 3 weeks. Continue nasal steroid and inhaler.   Problem # 2:  ANXIETY (ICD-300.00) Assessment: Deteriorated  Her updated medication list for this problem includes:    Prozac 40 Mg Caps (Fluoxetine hcl) .Marland Kitchen... Take 1 tablet by mouth once a day  Discussed medication use and relaxation techniques. She is also open to referral for counseling.  Discussed Add mood stabilizer low dose at bedtime instead of increasing the prozac. F/U in 3 weeks. Warned of potential SE, including sedation.   Complete Medication List: 1)  Prozac 40 Mg Caps (Fluoxetine hcl) .... Take 1 tablet by mouth once a day 2)  Proair Hfa 108 (90 Base) Mcg/act Aers (Albuterol sulfate) .... 2-4 puff inhaled every 4-6 hours as needed sob 3)  Symbicort 80-4.5 Mcg/act Aero (Budesonide-formoterol fumarate) .... 2 puffs inhaled two times a day 4)  Fluticasone Propionate 50 Mcg/act Susp (Fluticasone propionate) .... 2 sprays in each nostril daily 5)  Seroquel 25 Mg Tabs (Quetiapine fumarate) .... Take once a day about 1 hour before bedtime. 6)  Bactrim Ds 800-160 Mg Tabs (Sulfamethoxazole-trimethoprim) .... Take 1 tablet by mouth two times a day for 14 days  Other Orders: Psychology Referral (Psychology) T-Mammography Bilateral Screening ([address])  Patient Instructions: 1)  Please schedule a follow-up appointment in 3 weeks for mood.  2)  Call if not feeling some better in a week with your sinuses.  Prescriptions: BACTRIM DS 800-160 MG TABS (SULFAMETHOXAZOLE-TRIMETHOPRIM) Take 1 tablet by mouth two times a day for 14 days  #28 x 0   Entered and Authorized by:   Nani Gasser MD   Signed by:   Nani Gasser MD on 03/01/2010   Method used:   Electronically to        [facility] [phone]* (retail)        [address]       [place], [place]  [address]       Ph: [phone]       Fax: [phone]   RxID:   [id number] SEROQUEL 25 MG TABS (QUETIAPINE FUMARATE) Take once a day about 1 hour before bedtime.  #30 x 1   Entered and Authorized by:   Nani Gasser MD   Signed by:   Nani Gasser MD on 03/01/2010   Method used:   Electronically to        [facility] [phone]* (retail)       [address] [place], [place]  [address]       Ph: [phone]       Fax: [phone]   RxID:   [id number]    Orders Added: 1)  Psychology Referral [Psychology] 2)  T-Mammography Bilateral Screening [[address]] 3)  Est. Patient Level IV [[address]]      Appended Document: Sinusitis, depresson NO GI sxs.

## 2010-03-13 ENCOUNTER — Ambulatory Visit (HOSPITAL_COMMUNITY): Payer: Commercial Indemnity | Admitting: Behavioral Health

## 2010-03-20 ENCOUNTER — Ambulatory Visit (INDEPENDENT_AMBULATORY_CARE_PROVIDER_SITE_OTHER): Payer: Commercial Indemnity | Admitting: Behavioral Health

## 2010-03-20 DIAGNOSIS — F411 Generalized anxiety disorder: Secondary | ICD-10-CM

## 2010-03-20 DIAGNOSIS — F331 Major depressive disorder, recurrent, moderate: Secondary | ICD-10-CM

## 2010-03-21 ENCOUNTER — Ambulatory Visit (INDEPENDENT_AMBULATORY_CARE_PROVIDER_SITE_OTHER): Payer: Commercial Indemnity | Admitting: Family Medicine

## 2010-03-21 ENCOUNTER — Encounter: Payer: Self-pay | Admitting: Family Medicine

## 2010-03-21 DIAGNOSIS — F339 Major depressive disorder, recurrent, unspecified: Secondary | ICD-10-CM

## 2010-03-27 ENCOUNTER — Encounter (HOSPITAL_COMMUNITY): Payer: Commercial Indemnity | Admitting: Behavioral Health

## 2010-03-27 NOTE — Assessment & Plan Note (Signed)
Summary: 3wk f/u depression   Vital Signs:  Patient profile:   55 year old female Height:      65.5 inches Weight:      212 pounds Pulse rate:   101 / minute BP sitting:   124 / 77  (right arm) Cuff size:   regular  Vitals Entered By: Avon Gully CMA, Duncan Dull) (March 21, 2010 11:10 AM) CC: f/u depression   Primary Care Provider:  Nani Gasser MD  CC:  f/u depression.  History of Present Illness: Was having dry heaves on the 80mg  of prozac so went back to the 40mg . Athought she was on the ABX at the same time and wasn't sure whic may have caused it.  Did go see Dr. Michelle Piper yesterday and really likes him.  Feels  very tired and fatigued. Comes home and then crashes. She does her prozac at bedtime. Wakes up at 6AM.  Veryl Speak goes to bed at midnight.   Current Medications (verified): 1)  Prozac 40 Mg Caps (Fluoxetine Hcl) .... Take 2  Tablet By Mouth Once A Day 2)  Proair Hfa 108 (90 Base) Mcg/act Aers (Albuterol Sulfate) .... 2-4 Puff Inhaled Every 4-6 Hours As Needed Sob 3)  Symbicort 80-4.5 Mcg/act Aero (Budesonide-Formoterol Fumarate) .... 2 Puffs Inhaled Two Times A Day 4)  Fluticasone Propionate 50 Mcg/act Susp (Fluticasone Propionate) .... 2 Sprays in Each Nostril Daily  Allergies (verified): 1)  Pcn  Comments:  Nurse/Medical Assistant: The patient's medications and allergies were reviewed with the patient and were updated in the Medication and Allergy Lists. Avon Gully CMA, Duncan Dull) (March 21, 2010 11:11 AM)  Physical Exam  General:  Well-developed,well-nourished,in no acute distress; alert,appropriate and cooperative throughout examination Head:  Normocephalic and atraumatic without obvious abnormalities. No apparent alopecia or balding. Lungs:  Normal respiratory effort, chest expands symmetrically. Lungs are clear to auscultation, no crackles or wheezes. Heart:  Normal rate and regular rhythm. S1 and S2 normal without gallop, murmur, click, rub or other  extra sounds.   Impression & Recommendations:  Problem # 1:  DEPRESSION, MAJOR, RECURRENT (ICD-296.30) PHQ-9 score of 5 today. Doig well on the current regimen. Discusssed different options. Could consider inc to 60mg  if wouldlike.   Seeing Dr. Noe Gens (counseling ) and this is helping.  Continue current regimen adn f/u in 1 month.    Complete Medication List: 1)  Prozac 40 Mg Caps (Fluoxetine hcl) .... Take 2  tablet by mouth once a day 2)  Proair Hfa 108 (90 Base) Mcg/act Aers (Albuterol sulfate) .... 2-4 puff inhaled every 4-6 hours as needed sob 3)  Symbicort 80-4.5 Mcg/act Aero (Budesonide-formoterol fumarate) .... 2 puffs inhaled two times a day 4)  Fluticasone Propionate 50 Mcg/act Susp (Fluticasone propionate) .... 2 sprays in each nostril daily 5)  Trazodone Hcl 50 Mg Tabs (Trazodone hcl) .... Take 1 tablet by mouth once a day at bedtime as needed for sleep.  Patient Instructions: 1)  Please schedule a follow-up appointment in 1 month for mood.  Prescriptions: TRAZODONE HCL 50 MG TABS (TRAZODONE HCL) Take 1 tablet by mouth once a day at bedtime as needed for sleep.  #30 x 1   Entered and Authorized by:   Nani Gasser MD   Signed by:   Nani Gasser MD on 03/21/2010   Method used:   Electronically to        [facility] [phone]* (retail)       [address]  [place], [place]  [address]       Ph: [phone]       Fax: [phone]   RxID:   [id number]    Orders Added: 1)  Est. Patient Level III [010[address]]

## 2010-04-03 ENCOUNTER — Encounter (INDEPENDENT_AMBULATORY_CARE_PROVIDER_SITE_OTHER): Payer: Commercial Indemnity | Admitting: Behavioral Health

## 2010-04-03 DIAGNOSIS — F411 Generalized anxiety disorder: Secondary | ICD-10-CM

## 2010-04-03 DIAGNOSIS — F331 Major depressive disorder, recurrent, moderate: Secondary | ICD-10-CM

## 2010-04-10 ENCOUNTER — Telehealth: Payer: Self-pay | Admitting: Family Medicine

## 2010-04-10 ENCOUNTER — Encounter (HOSPITAL_COMMUNITY): Payer: Commercial Indemnity | Admitting: Behavioral Health

## 2010-04-10 NOTE — Telephone Encounter (Signed)
Pt was seen in [facility] ER, says still sick & wants an appt this week, I offerred an appt for first week of April since there isn't an appt avail this week, pt would like to speak with a nurse

## 2010-04-11 ENCOUNTER — Encounter: Payer: Self-pay | Admitting: Family Medicine

## 2010-04-11 NOTE — Telephone Encounter (Signed)
Sherri [name] 04/10/2010 9:27 AM Signed  Pt was seen in [facility] ER, says still sick & wants an appt this week, I offerred an appt for first week of April since there isn't an appt avail this week, pt would like to speak with a nurse

## 2010-04-12 ENCOUNTER — Ambulatory Visit (INDEPENDENT_AMBULATORY_CARE_PROVIDER_SITE_OTHER): Payer: Commercial Indemnity | Admitting: Family Medicine

## 2010-04-12 ENCOUNTER — Encounter: Payer: Self-pay | Admitting: Family Medicine

## 2010-04-12 DIAGNOSIS — J441 Chronic obstructive pulmonary disease with (acute) exacerbation: Secondary | ICD-10-CM

## 2010-04-12 DIAGNOSIS — J309 Allergic rhinitis, unspecified: Secondary | ICD-10-CM

## 2010-04-12 MED ORDER — FEXOFENADINE HCL 180 MG PO TABS: 180.0000 mg | ORAL_TABLET | Freq: Every day | ORAL | Status: DC

## 2010-04-12 MED ORDER — PREDNISONE 20 MG PO TABS: 20.0000 mg | ORAL_TABLET | Freq: Every day | ORAL | Status: AC

## 2010-04-12 NOTE — Progress Notes (Signed)
  Subjective:    Patient ID: Sherri [name], female    DOB: [birth year], 55 y.o.   MRN: [id number]  HPI Productive cough  And started to feel even weaker. Last Friday slept all day.  By Sunday went to the ED and was SOB and wheezing. Had a CXR.   Given an ABX. And told ot use the albuterol.  Feels some better. In the evenings gets hoarse and inc phlegm production.  She is on doxyccyline.  She has been on prednisone for 5 day. Has one more day of the prednisone. Using her rescue inhaler tid.  Still using her symbicort.  No more fevers. Has severe HA and ear pressure and behind her eyes.  Also some post nasasl drip. Only mild congestion but some sneezing. Does have allergic rhinitis. She has been using her nasal spray.     Review of Systems     Objective:   Physical Exam  Constitutional: She appears well-developed and well-nourished.  HENT:  Head: Normocephalic and atraumatic.  Right Ear: External ear normal.  Left Ear: External ear normal.  Nose: Nose normal.  Mouth/Throat: Oropharynx is clear and moist.  Eyes: Conjunctivae and EOM are normal. Pupils are equal, round, and reactive to light.  Neck: Normal range of motion. Neck supple.  Cardiovascular: Normal rate, regular rhythm and normal heart sounds.   Pulmonary/Chest: Effort normal and breath sounds normal.  Lymphadenopathy:    She has no cervical adenopathy.          Assessment & Plan:

## 2010-04-12 NOTE — Assessment & Plan Note (Signed)
She has some improvement. I do think her allergies are complicating this. Rec she start allegra in addition to her nasal steroid spray.  Complete the doxycycline. Also will extend her steroids out 5 more days at the lower dose.  F/U in 2 weeks if not completley better. Wean albuterol prn as she gets better.  Also if sinus sxs not resolving please let me know. Can consider a FQ if sinuses are not better.

## 2010-04-12 NOTE — Assessment & Plan Note (Signed)
Will start an oral antihistamine.  Continue the nasal steroid.

## 2010-04-12 NOTE — Patient Instructions (Signed)
Follow up in 2 weeks if you are not completely better.  Let me know if the sinus pressure is not better either.

## 2010-04-12 NOTE — Telephone Encounter (Signed)
Ok to put on schedule for 3:45 today.

## 2010-04-18 ENCOUNTER — Ambulatory Visit: Payer: Commercial Indemnity | Admitting: Family Medicine

## 2010-05-01 ENCOUNTER — Encounter: Payer: Self-pay | Admitting: Emergency Medicine

## 2010-05-01 ENCOUNTER — Inpatient Hospital Stay (INDEPENDENT_AMBULATORY_CARE_PROVIDER_SITE_OTHER)
Admission: RE | Admit: 2010-05-01 | Discharge: 2010-05-01 | Disposition: A | Discharge status: Home / Self Care | Payer: Commercial Indemnity | Source: Ambulatory Visit | Attending: Emergency Medicine | Admitting: Emergency Medicine

## 2010-05-01 DIAGNOSIS — J069 Acute upper respiratory infection, unspecified: Secondary | ICD-10-CM

## 2010-05-01 DIAGNOSIS — J309 Allergic rhinitis, unspecified: Secondary | ICD-10-CM

## 2010-05-02 ENCOUNTER — Telehealth (INDEPENDENT_AMBULATORY_CARE_PROVIDER_SITE_OTHER): Payer: Self-pay | Admitting: *Deleted

## 2010-05-05 ENCOUNTER — Telehealth (INDEPENDENT_AMBULATORY_CARE_PROVIDER_SITE_OTHER): Payer: Self-pay

## 2010-05-08 ENCOUNTER — Encounter: Payer: Self-pay | Admitting: Family Medicine

## 2010-05-29 NOTE — Op Note (Signed)
Sherri [name], Sherri [name]             ACCOUNT NO.:  [id number]   MEDICAL RECORD NO.:  [id number]          PATIENT TYPE:  AMB   LOCATION:  SDS                          FACILITY:  [facility]   PHYSICIAN:  Henry A. Pool, M.D.    DATE OF BIRTH:  [birth year]   DATE OF PROCEDURE:  01/26/2007  DATE OF DISCHARGE:                               OPERATIVE REPORT   PREOPERATIVE DIAGNOSIS:  Left carpal tunnel syndrome.   POSTOPERATIVE DIAGNOSIS:  Left carpal tunnel syndrome.   PROCEDURE NAME:  Left carpal tunnel release.   SURGEON:  Kathaleen Maser. Pool, M.D.   ANESTHESIA:  Regional Bier block.   INDICATIONS:  [name] is a 55 year old female with history of left  hand paresthesias, pain and weakness consistent with carpal tunnel  syndrome.  EMGs and nerve studies are confirmatory.  The patient was  counseled as to her options.  She has failed conservative management.  She presents now for left-sided carpal tunnel release.   OPERATIVE NOTE:  The patient was taken to the operating room where she  is placed under regional anesthesia with IV sedation.  The patient's  left hand and forearm prepped and draped sterilely.  15 blade used make  a linear skin incision along the mid palmar line just distal to the  distal wrist crease.  This carried down sharply to the palmar fascia.  Transverse carpal ligament was identified.  Transverse carpal ligament  divided exposing the underlying median nerve.  Median nerve was then  protected using a Therapist, nutritional and the transverse carpal ligament was  then divided distally into the palm of the hand until all compression  ceased and fat herniated into the operative bed from the deep palmar  space.  The Freer elevator was then redirected proximally.  The proximal  fibers of the transverse carpal ligament was then divided into the  forearm protecting the median nerve the whole away.  At this point a  very thorough decompression of the nerve had been achieved.  There is  no  evidence of injury to the nerve or other structures.  Wound was  irrigated with antibiotic solution.  It was then closed with 4-0 Vicryl  suture at the deep dermis and interrupted 4-0 nylon suture at the  surface.  Sterile dressing was applied.  There were no apparent  complications. The patient tolerated the procedure well and she returns  recovery room postoperatively.           ______________________________  Kathaleen Maser Pool, M.D.     HAP/MEDQ  D:  01/26/2007  T:  01/26/2007  Job:  [id number]

## 2010-06-01 NOTE — Op Note (Signed)
Sherri [name], Sherri [name]             ACCOUNT NO.:  [id number]   MEDICAL RECORD NO.:  [id number]          PATIENT TYPE:  AMB   LOCATION:  SDS                          FACILITY:  [facility]   PHYSICIAN:  Henry A. Pool, M.D.    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   PROCEDURE NOTE:  C4-5 and C5-6 anterior cervical diskectomy and fusion with  allograft and plating.   SURGEON:  Kathaleen Maser. Pool, M.D.   ASSISTANT:  Reinaldo Meeker, M.D.   ANESTHESIA:  General oral endotracheal.   PREMEDICATION:  [name] is a 55 year old female with history of neck  and bilateral upper extremity symptoms, left greater than right.  Workup  demonstrates evidence of significant spondylosis with stenosis.  The patient  had been counseled as to her options.  She decided proceed with a C4-5 and  C5-6 anterior cervical diskectomy and fusion with allograft and plating in  hopes of improving her symptoms.   OPERATIVE NOTE:  The patient was taken to the operating room, placed on the  table in supine position.  After anesthesia was achieved, the patient was  positioned supine with her neck slightly extended and held in place with  halter traction.   The patient's anterior cervical region was prepped and draped sterile.  A  10 blade to make a linear skin incision overlying the C5 vertebral level.  The incision was then carried down sharply to the platysma.  Platysma was  then divided vertically and dissection proceeded down the medial border of  the sternocleidomastoid muscle and carotid sheath.  Trachea and esophagus  were mobilized and retracted towards the left.  Prevertebral fascia was  stripped off the anterior spinal column.  Longus coli muscles  were then  elevated bilaterally using electrocautery.  Deep self-retaining retractors  were placed, intraoperative fluoroscopy views and levels were confirmed.  Disk space of both C4-5 and C5-6 were then incised with a 15 blade in a  rectangular fashion.  A wide disk space clean out was then achieved using  pituitary rongeurs, forward and  __________  curettes, Kerrison rongeurs,  and high-speed drill __________  the disk removed down to level of the  posterior annulus.  Microscope was brought into the field and used  throughout the remainder of the diskectomy.  Remaining aspects of annulus  and osteophytes removed using the high-speed drill down to posterior  longitudinal ligament.  Posterior longitudinal ligament was then elevated  and resected in piecemeal fashion using Kerrison rongeurs. The underlying  thecal sac was then identified.  Wide central decompression then performed  by undercutting the bodies of C4 and C5.  Wide decompression was then  performed including decompressive foraminotomies at the exiting C5 nerve  root bilaterally.  All elements of the disk herniation and spondylytic  protrusions were resected.  At this point, a very thorough  decompression was  achieved.  There was no evidence of injury to the thecal sac or nerve roots.  Gelfoam was placed topically for hemostasis, and then attention was then  placed at C5-6 which was decompressed in a similar fashion again without  complication.  Wounds were then irrigated with antibiotic solution.  The 6-  mm __________  allograft wedges were then impacted into place and recessed  for approximately 1 mm from the anterior vertical margin.  A 42-mm Atlantis  cervical plate was then placed over the C4, C5 and C6 levels.  This was then  attached under fluoroscopic guidance using 13-mm variable angle screws, 2  each at all 3 levels.  Final tightening was achieved.  Locking screws  engaged at all 3 levels.  Final images revealed good  position of bone  grafts, hardware  __________ .  Wounds were then irrigated with antibiotic  solution.  Gelfoam was placed for hemostasis which was found to be good.  Wounds were then closed in typical fashion.  Steri-Strips and sterile  dressing were applied.  There were no complications.  The patient tolerated  the procedure well, and she returned to the recovery room for the  postoperative period.           ______________________________  Kathaleen Maser Pool, M.D.     HAP/MEDQ  D:  09/27/2005  T:  09/28/2005  Job:  [id number]

## 2010-07-16 ENCOUNTER — Other Ambulatory Visit: Payer: Self-pay | Admitting: Family Medicine

## 2010-07-16 MED ORDER — TRAZODONE HCL 50 MG PO TABS: 50.0000 mg | ORAL_TABLET | Freq: Every day | ORAL | Status: DC

## 2010-07-19 ENCOUNTER — Other Ambulatory Visit: Payer: Self-pay | Admitting: Family Medicine

## 2010-07-19 MED ORDER — FLUOXETINE HCL 40 MG PO CAPS: ORAL_CAPSULE | ORAL | Status: DC

## 2010-07-19 NOTE — Telephone Encounter (Signed)
[facility] sent request for refill of prozac 40 mg (two by mouth daily). Authorized # 60/0 refills.  Pt was suppose to have followed up in 04/2010.  Refill sent ot [facility]. Jarvis Newcomer, LPN Dom[facility] Dimes

## 2010-07-23 ENCOUNTER — Other Ambulatory Visit: Payer: Self-pay | Admitting: Family Medicine

## 2010-07-24 ENCOUNTER — Other Ambulatory Visit: Payer: Self-pay | Admitting: Family Medicine

## 2010-07-24 NOTE — Telephone Encounter (Signed)
Rx refill requested for prozac 40 mg. RF was last given on 07-19-10.  #60/0 refills.  Pt should be good til 08-19-10. Jarvis Newcomer, LPN [facility] Dimes

## 2010-09-05 ENCOUNTER — Telehealth: Payer: Self-pay | Admitting: *Deleted

## 2010-09-05 NOTE — Telephone Encounter (Signed)
Received letter from [employer] of pt being non- compliant with meds and refills. Called pt and spoke with her about this. Pt states she is now taking her meds every day and is feeling better doing this. States at [employer] its a money issue and some months can not afford it. KJ LPN

## 2010-09-24 ENCOUNTER — Inpatient Hospital Stay (INDEPENDENT_AMBULATORY_CARE_PROVIDER_SITE_OTHER)
Admission: RE | Admit: 2010-09-24 | Discharge: 2010-09-24 | Disposition: A | Discharge status: Home / Self Care | Payer: Commercial Indemnity | Source: Ambulatory Visit | Attending: Family Medicine | Admitting: Family Medicine

## 2010-09-24 ENCOUNTER — Encounter: Payer: Self-pay | Admitting: Family Medicine

## 2010-09-24 DIAGNOSIS — R197 Diarrhea, unspecified: Secondary | ICD-10-CM

## 2010-09-24 DIAGNOSIS — R1033 Periumbilical pain: Secondary | ICD-10-CM

## 2010-09-24 DIAGNOSIS — R110 Nausea: Secondary | ICD-10-CM

## 2010-09-25 ENCOUNTER — Telehealth: Payer: Self-pay | Admitting: Family Medicine

## 2010-09-25 ENCOUNTER — Other Ambulatory Visit: Payer: Self-pay | Admitting: *Deleted

## 2010-09-25 ENCOUNTER — Other Ambulatory Visit: Payer: Self-pay | Admitting: Family Medicine

## 2010-09-25 MED ORDER — TRAZODONE HCL 50 MG PO TABS: 50.0000 mg | ORAL_TABLET | Freq: Every day | ORAL | Status: DC

## 2010-09-25 MED ORDER — FLUOXETINE HCL 40 MG PO CAPS: ORAL_CAPSULE | ORAL | Status: DC

## 2010-09-25 NOTE — Telephone Encounter (Signed)
Patient called left messg  That she has been out of her transadome and prozac for two days and she went to pharmacy to get refill and she couldn't. Patient states the pharmacy has sent a refill request today and she needs her prescriptions asap today if possible..[phone]

## 2010-09-25 NOTE — Telephone Encounter (Signed)
When reviewing pt med list in her chart file; the requested medications have already been taken care of today. Jarvis Newcomer, LPN [facility] Dimes

## 2010-09-27 ENCOUNTER — Ambulatory Visit
Admission: RE | Admit: 2010-09-27 | Discharge: 2010-09-27 | Disposition: A | Discharge status: Home / Self Care | Payer: Commercial Indemnity | Source: Ambulatory Visit | Attending: Family Medicine | Admitting: Family Medicine

## 2010-10-01 ENCOUNTER — Encounter: Payer: Self-pay | Admitting: Family Medicine

## 2010-10-04 ENCOUNTER — Encounter: Payer: Self-pay | Admitting: Family Medicine

## 2010-10-04 ENCOUNTER — Ambulatory Visit (INDEPENDENT_AMBULATORY_CARE_PROVIDER_SITE_OTHER): Payer: Commercial Indemnity | Admitting: Family Medicine

## 2010-10-04 VITALS — BP 120/75 | HR 79 | Wt 187.0 lb

## 2010-10-04 DIAGNOSIS — R197 Diarrhea, unspecified: Secondary | ICD-10-CM

## 2010-10-04 DIAGNOSIS — K529 Noninfective gastroenteritis and colitis, unspecified: Secondary | ICD-10-CM

## 2010-10-04 DIAGNOSIS — J441 Chronic obstructive pulmonary disease with (acute) exacerbation: Secondary | ICD-10-CM

## 2010-10-04 DIAGNOSIS — J069 Acute upper respiratory infection, unspecified: Secondary | ICD-10-CM

## 2010-10-04 DIAGNOSIS — Z23 Encounter for immunization: Secondary | ICD-10-CM

## 2010-10-04 DIAGNOSIS — Z1211 Encounter for screening for malignant neoplasm of colon: Secondary | ICD-10-CM

## 2010-10-04 LAB — CBC
HCT: 36.5
Hemoglobin: 12.8
Platelets: 257

## 2010-10-04 MED ORDER — FLUTICASONE PROPIONATE 50 MCG/ACT NA SUSP: 2.0000 | Freq: Every day | NASAL | Status: DC

## 2010-10-04 NOTE — Patient Instructions (Signed)
We will call you with the lab results and GI referral.

## 2010-10-04 NOTE — Progress Notes (Signed)
Subjective:    Patient ID: Sherri [name], female    DOB: February 28, 1955, 55 y.o.   MRN: [id number]  HPI Every time she eats gets diarrhea for 3 weeks. Had normal [place] of pancreas and GB. They were normal.  Said had labwork.  No Pain or cramping between BMs. No fever.  No blood in the stool.  Some stools loose and watery.  No recent ABX use. Normal CBC.  Note additional aggravating symptoms. No alleviating symptoms. She has backed off on eating a lot of fruit and hopes this will improve things.  Sius pressure and congestion and cough for x 2 days.  + sick contacts. Cough is productive. No fever. No ST. NO HA. No ear pain.  She is not taking any over-the-counter medications for this. No nausea vomiting or diarrhea.  Review of Systems BP 120/75  Pulse 79  Wt 187 lb (84.823 kg)  SpO2 93%  PF 300 L/min    Allergies  Allergen Reactions  . Penicillins     REACTION: hives    No past medical history on file.  Past Surgical History  Procedure Date  . Ankle sx 1973  . Cesarean section 1976  . Left groin hernia 1974  . Cervical disc surgery     (? laminectomy?)    History   Social History  . Marital Status: Married    Spouse Name: N/A    Number of Children: N/A  . Years of Education: N/A   Occupational History  . Not on file.   Social History Main Topics  . Smoking status: Current Everyday Smoker -- 0.5 packs/day for 35 years    Types: Cigarettes  . Smokeless tobacco: Not on file  . Alcohol Use: No  . Drug Use: No  . Sexually Active:    Other Topics Concern  . Not on file   Social History Narrative  . No narrative on file    Family History  Problem Relation Age of Onset  . Hypertension Mother   . Depression Mother   . Alzheimer's disease Mother   . Alcohol abuse Father   . Diabetes Father   . Cancer Other     pancreatic    Sherri [name] does not currently have medications on file.     Objective:   Physical Exam  Constitutional: She is oriented to person,  place, and time. She appears well-developed and well-nourished.  HENT:  Head: Normocephalic and atraumatic.  Right Ear: External ear normal.  Left Ear: External ear normal.  Nose: Nose normal.  Mouth/Throat: Oropharynx is clear and moist.       TMs and canals are clear.   Eyes: Conjunctivae and EOM are normal. Pupils are equal, round, and reactive to light.  Neck: Neck supple. No thyromegaly present.  Cardiovascular: Normal rate, regular rhythm and normal heart sounds.   Pulmonary/Chest: Effort normal. She has wheezes.       Wheeze at the RLL.   Abdominal: Soft. Bowel sounds are normal. She exhibits no distension and no mass. There is no tenderness. There is no rebound and no guarding.  Musculoskeletal: She exhibits no edema.  Lymphadenopathy:    She has no cervical adenopathy.  Neurological: She is alert and oriented to person, place, and time.  Skin: Skin is warm and dry.  Psychiatric: She has a normal mood and affect.          Assessment & Plan:  URI - likely viral. Recommend use her neb when she gets home.  We offered to give her nebulizer here but she said she was going straight home and would do it there. We did give her some taping to take home. I asked her to call the office if she gets worse or she is just not better in about a week. If she starts to get short of breath please let me know. Right now we'll hold off on antibiotics. Recommend over-the-counter symptomatic care.  Chronic diarrhea ( x 3 weeks) - Will check stool for c diff. She is overdue for a screening colonoscopy anyway. Has never had one so will refer to GI for further eval.  Will call with result. Recommend 1/2 cup rice daily to help with consistancy of stools.

## 2010-10-11 ENCOUNTER — Ambulatory Visit: Payer: Commercial Indemnity | Admitting: Family Medicine

## 2010-10-12 ENCOUNTER — Encounter: Payer: Self-pay | Admitting: Family Medicine

## 2010-10-12 ENCOUNTER — Ambulatory Visit (INDEPENDENT_AMBULATORY_CARE_PROVIDER_SITE_OTHER): Payer: Commercial Indemnity | Admitting: Family Medicine

## 2010-10-12 VITALS — BP 143/82 | HR 64 | Temp 98.3°F | Wt 186.0 lb

## 2010-10-12 DIAGNOSIS — J441 Chronic obstructive pulmonary disease with (acute) exacerbation: Secondary | ICD-10-CM

## 2010-10-12 DIAGNOSIS — J40 Bronchitis, not specified as acute or chronic: Secondary | ICD-10-CM

## 2010-10-12 MED ORDER — AZITHROMYCIN 250 MG PO TABS: ORAL_TABLET | ORAL | Status: AC

## 2010-10-12 MED ORDER — METHYLPREDNISOLONE ACETATE 80 MG/ML IJ SUSP
80.0000 mg | Freq: Once | INTRAMUSCULAR | Status: AC
  Administered 2010-10-12: 80 mg via INTRAMUSCULAR

## 2010-10-12 NOTE — Patient Instructions (Signed)
Call me if you are not feeling any better by Monday or Tuesday.

## 2010-10-12 NOTE — Progress Notes (Signed)
  Subjective:    Patient ID: Sherri [name], female    DOB: 01-28-1955, 55 y.o.   MRN: [id number]  HPI  Cough for 10 days. Getting worse. Getting SOB.  Feels like having fever. +HA. nO ST.  Productive, brown.  Quit smoking 2 weeks ago, because she didn't feel well.  Using her inhalers 2-3 times a day in addition to her symbicort.  S no GI symptoms. She is not taking any cough or cold medicines currently. She does have a history of COPD.   Review of Systems       Objective:   Physical Exam  Constitutional: She is oriented to person, place, and time. She appears well-developed and well-nourished.  HENT:  Head: Normocephalic and atraumatic.  Right Ear: External ear normal.  Left Ear: External ear normal.  Nose: Nose normal.  Mouth/Throat: Oropharynx is clear and moist.       TMs and canals are clear.   Eyes: Conjunctivae and EOM are normal. Pupils are equal, round, and reactive to light.  Neck: Neck supple. No thyromegaly present.  Cardiovascular: Normal rate, regular rhythm and normal heart sounds.   Pulmonary/Chest: Effort normal and breath sounds normal. She has no wheezes.       Diffuse rhonchi.  Coughing a lot in the room.   Lymphadenopathy:    She has no cervical adenopathy.  Neurological: She is alert and oriented to person, place, and time.  Skin: Skin is warm and dry.  Psychiatric: She has a normal mood and affect.          Assessment & Plan:  Bronchitis - Peak flow in the yellow.  NEB tx given. Only mild improvement.  Start zpack.  Cosider steroids as well. Increase nebs to 3 x a day. And then taper as improving. Given Depo-medrol 80mg  IM x 1. Call if not better in one week of sooner if getting worse. I encouraged smoking cessation as well.

## 2010-10-23 ENCOUNTER — Telehealth: Payer: Self-pay | Admitting: Family Medicine

## 2010-10-24 NOTE — Telephone Encounter (Signed)
Closed

## 2010-10-25 ENCOUNTER — Telehealth: Payer: Self-pay | Admitting: Family Medicine

## 2010-10-25 MED ORDER — TRAZODONE HCL 50 MG PO TABS: 50.0000 mg | ORAL_TABLET | Freq: Every day | ORAL | Status: DC

## 2010-10-25 NOTE — Telephone Encounter (Signed)
Closed

## 2010-11-05 ENCOUNTER — Other Ambulatory Visit: Payer: Self-pay | Admitting: *Deleted

## 2010-11-05 MED ORDER — FLUOXETINE HCL 40 MG PO CAPS: ORAL_CAPSULE | ORAL | Status: DC

## 2010-12-05 ENCOUNTER — Other Ambulatory Visit: Payer: Self-pay | Admitting: Family Medicine

## 2010-12-10 ENCOUNTER — Ambulatory Visit (INDEPENDENT_AMBULATORY_CARE_PROVIDER_SITE_OTHER): Payer: Managed Care, Other (non HMO) | Admitting: Family Medicine

## 2010-12-10 ENCOUNTER — Encounter: Payer: Self-pay | Admitting: Family Medicine

## 2010-12-10 VITALS — BP 132/82 | HR 59 | Temp 98.2°F | Wt 180.0 lb

## 2010-12-10 DIAGNOSIS — J40 Bronchitis, not specified as acute or chronic: Secondary | ICD-10-CM

## 2010-12-10 MED ORDER — NABUMETONE 500 MG PO TABS: 500.0000 mg | ORAL_TABLET | Freq: Two times a day (BID) | ORAL | Status: DC

## 2010-12-10 MED ORDER — DOXYCYCLINE HYCLATE 100 MG PO TABS: 100.0000 mg | ORAL_TABLET | Freq: Two times a day (BID) | ORAL | Status: AC

## 2010-12-10 NOTE — Patient Instructions (Signed)
Call if not better in one week Please reschedule your colonoscopy.

## 2010-12-10 NOTE — Progress Notes (Signed)
  Subjective:    Patient ID: Sherri [name], female    DOB: November 04, 1955, 55 y.o.   MRN: [id number]  HPI Sinus pain for at least 6 days.  Has taken several falls lately. Feel in her sisters driveway and bumped her head and has been having HA.  Then when stood up after bending into the the refridgerator hit head on the door. HA is all over. Cough is productive.  Mostly clear  Nasal discharge.  Cough sputum is brown. Still smoking. Has dec smoking.  Occ SOB but mild. + tinnitus.  Ears feel stopped up and occ will feel the pressure release.    Review of Systems     Objective:   Physical Exam  Constitutional: She is oriented to person, place, and time. She appears well-developed and well-nourished.  HENT:  Head: Normocephalic and atraumatic.  Right Ear: External ear normal.  Left Ear: External ear normal.  Nose: Nose normal.  Mouth/Throat: Oropharynx is clear and moist.       TMs and canals are clear.   Eyes: Conjunctivae and EOM are normal. Pupils are equal, round, and reactive to light.  Neck: Neck supple. No thyromegaly present.  Cardiovascular: Normal rate, regular rhythm and normal heart sounds.   Pulmonary/Chest: Effort normal. She has no wheezes.       Diffuse ronchi  Lymphadenopathy:    She has no cervical adenopathy.  Neurological: She is alert and oriented to person, place, and time. She exhibits normal muscle tone.  Skin: Skin is warm and dry.  Psychiatric: She has a normal mood and affect.          Assessment & Plan:  Bronchitis  - COPD is stable.  Needs to schedule spirometry when better. We have never done this on her. Will tx with doxy since sputu is brown tinged. Call if not better in one week. Symptomatic care  OA- She ould like something besides IBU for her OA. Says sometimes this doesn't cut it. Will try relafen

## 2010-12-17 NOTE — Telephone Encounter (Signed)
  Phone Note Outgoing Call   Call placed by: Clemens Catholic LPN,  May 02, 2010 9:08 AM Call placed to: Patient Summary of Call: appt sch'ed with dr Clearance Coots (ENT) for 05/09/10 @ 9:30AM. pt notified of the appt and notes faxed. she states that she is feeling a little better this morning. Initial call taken by: Clemens Catholic LPN,  May 02, 2010 9:09 AM

## 2010-12-17 NOTE — Telephone Encounter (Signed)
  Phone Note Outgoing Call   Call placed by: Linton Flemings RN,  May 05, 2010 1:15 PM Call placed to: Patient Summary of Call: Called to instructed to be sure to stop the bactrim and return with any problems/concerns Initial call taken by: Linton Flemings RN,  May 05, 2010 1:17 PM

## 2010-12-17 NOTE — Progress Notes (Signed)
Summary: Followup Call  Followup call to patient:  She reports that she is able to minimize GI discomfort, loose stools, etc. by avoiding greasy foods. Note negative stool culture and positive stool lactoferrin.  GB ultrasound negative. Recommend follow-up appt to gastroenterologist.  She will next follow-up with her PCP. Donna Christen MD  October 01, 2010 3:29 PM

## 2010-12-17 NOTE — Progress Notes (Signed)
Summary: STOMACH PROBLEMS Room 3   Vital Signs:  Patient Profile:   55 Years Old Female CC:      Nausea x 2 wks, Diarrhea 2 wks immediately after eating, last 2 days green, hasn't eaten today Height:     65.5 inches (166.37 cm) Weight:      187 pounds O2 Sat:      93 % O2 treatment:    Room Air Temp:     97.3 degrees F oral Pulse rate:   66 / minute Pulse rhythm:   regular Resp:     14 per minute BP sitting:   131 / 83  (left arm) Cuff size:   large  Vitals Entered By: Emilio Math (September 24, 2010 10:57 AM)                  Current Allergies (reviewed today): PCNHistory of Present Illness Chief Complaint: Nausea x 2 wks, Diarrhea 2 wks immediately after eating, last 2 days green, hasn't eaten today History of Present Illness:  Subjective:  Patient complains of awakening two weeks ago with nausea.  The morning nausea has persisted without vomiting.  She subsequently developed watery diarrhea after eating.  No blood in stool.  She often feels hungry, but feels worse after eating, especially fried or greasy food.  No fevers, chills, and sweats.  No respiratory or GU symptoms.  No reflux symptoms. Past surgical history of C-section.  Family history of gall bladder disease mother.  Maternal relatives with diverticulitis. No recent foreign travel or drinking untreated water.  Current Meds PROZAC 40 MG CAPS (FLUOXETINE HCL) Take 2  tablet by mouth once a day PROAIR HFA 108 (90 BASE) MCG/ACT AERS (ALBUTEROL SULFATE) 2-4 puff inhaled every 4-6 hours as needed SOB SYMBICORT 80-4.5 MCG/ACT AERO (BUDESONIDE-FORMOTEROL FUMARATE) 2 puffs inhaled two times a day FLUTICASONE PROPIONATE 50 MCG/ACT SUSP (FLUTICASONE PROPIONATE) 2 sprays in each nostril daily TRAZODONE HCL 50 MG TABS (TRAZODONE HCL) Take 1 tablet by mouth once a day at bedtime as needed for sleep. FEXOFENADINE HCL 180 MG TABS (FEXOFENADINE HCL)  ZOFRAN 4 MG TABS (ONDANSETRON HCL) One or two tabs by mouth q4 to 6hr as  needed nausea  REVIEW OF SYSTEMS Constitutional Symptoms      Denies fever, chills, night sweats, weight loss, weight gain, and fatigue.  Eyes       Denies change in vision, eye pain, eye discharge, glasses, contact lenses, and eye surgery. Ear/Nose/Throat/Mouth       Denies hearing loss/aids, change in hearing, ear pain, ear discharge, dizziness, frequent runny nose, frequent nose bleeds, sinus problems, sore throat, hoarseness, and tooth pain or bleeding.  Respiratory       Denies dry cough, productive cough, wheezing, shortness of breath, asthma, bronchitis, and emphysema/COPD.  Cardiovascular       Denies murmurs, chest pain, and tires easily with exhertion.    Gastrointestinal       Complains of nausea/vomiting and diarrhea.      Denies stomach pain, constipation, blood in bowel movements, and indigestion. Genitourniary       Denies painful urination, kidney stones, and loss of urinary control. Neurological       Denies paralysis, seizures, and fainting/blackouts. Musculoskeletal       Denies muscle pain, joint pain, joint stiffness, decreased range of motion, redness, swelling, muscle weakness, and gout.  Skin       Denies bruising, unusual mles/lumps or sores, and hair/skin or nail changes.  Psych  Denies mood changes, temper/anger issues, anxiety/stress, speech problems, depression, and sleep problems.  Past History:  Past Medical History: Reviewed history from 12/30/2007 and no changes required. Cervical Stenosis C4-T1, G1 P1   Past Surgical History: Ankle sx  1973, c/sec 1976, Left groin Hernia  1974 Cervical disc surgery (?laminectomy?)  Caesarean section  Family History: Reviewed history from 03/01/2010 and no changes required.  Father-alcoholism, DM Mother - HTN, depresssion, alzheimers  pancreatic cancer-GM  Social History: Reviewed history from 12/30/2007 and no changes required. 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  Married to [place].   Lives with husband, mother, and grandson.  Smokes 1/2 ppd for 35 yrs.  No EtOH, no drugs, 3 caffeinated drinks, no regular exercise.   Objective:  No acute distress; alert and oriented  Eyes:  Pupils are equal, round, and reactive to light and accomodation.  Extraocular movement is intact.  Conjunctivae are not inflamed.  Mouth:  No lesions; moist mucous membranes  Pharynx:  Normal  Neck:  Supple.  No adenopathy is present.  No thyromegaly is present  Lungs:  Clear to auscultation.  Breath sounds are equal.  Heart:  Regular rate and rhythm without murmurs, rubs, or gallops.  Abdomen:  Vague tenderness in the peri-umbilical region without masses or hepatosplenomegaly.  Bowel sounds are present.  No CVA or flank tenderness.  No rebound tenderness. Extremities:  No edema.   Skin:  No rash CBC:  WBC 6.8 ; LY 29.2, MO 4.9, GR 65.9; Hgb 14.2  Assessment New Problems: ABDOMINAL PAIN, PERIUMBILICAL (ICD-789.05) DIARRHEA (ICD-787.91) NAUSEA ALONE (ICD-787.02)  ? BILIARY COLIC ? COLITIS  Plan New Medications/Changes: ZOFRAN 4 MG TABS (ONDANSETRON HCL) One or two tabs by mouth q4 to 6hr as needed nausea  #15 (fifteen) x 1, 09/24/2010, Donna Christen MD  New Orders: CBC w/Diff [[id number]] T-Comprehensive Metabolic Panel [[id number]] T-Amylase [[id number]] T-Lipase [[id number]] T-Fecal Lactoferrin [70400] T-Culture, Stool [87045/[id number]] Est. Patient Level V [29562] Planning Comments:   Clear liquids today then gradually advance diet.  Zofran for nausea Check CMP, amylase, lipase, stool culture, fecal lactoferrin. May need gall bladder U/S Return (or proceed to ER) for worsening symptoms   The patient and/or caregiver has been counseled thoroughly with regard to medications prescribed including dosage, schedule, interactions, rationale for use, and possible side effects and they verbalize understanding.  Diagnoses and expected course of recovery discussed and will return if not  improved as expected or if the condition worsens. Patient and/or caregiver verbalized understanding.  Prescriptions: ZOFRAN 4 MG TABS (ONDANSETRON HCL) One or two tabs by mouth q4 to 6hr as needed nausea  #15 (fifteen) x 1   Entered and Authorized by:   Donna Christen MD   Signed by:   Donna Christen MD on 09/24/2010   Method used:   Print then Give to Patient   RxID:   [id number]   Orders Added: 1)  CBC w/Diff [[id number]] 2)  T-Comprehensive Metabolic Panel [[id number]] 3)  T-Amylase [27253-66440] 4)  T-Lipase [34742-59563] 5)  T-Fecal Lactoferrin [70400] 6)  T-Culture, Stool [87045/[id number]] 7)  Est. Patient Level V [87564]

## 2010-12-17 NOTE — Progress Notes (Signed)
Summary: Followup call   Followup call to patient:  She states that her nausea, abdominal bloating, and diarrhea resolved after starting clear liquids.  Has not been able to get stool specimen since diarrhea stopped. She notes that she has had recurring abdominal discomfort after eating greasy foods for several weeks.  Discussed lab results (note elevated glucose).   Chart review indicates elevated glucose on multiple CMP tests.  Assessment:   Suspect biliary colic. Suspect type 2 diabetes  Plan:   Gradually advance diet. Schedule gall bladder U/S Follow-up PCP for hyperglycemia. Donna Christen MD  September 25, 2010 2:19 PM   08/25/10- appt sch'ed for a abd U/S for 09/27/10 @ 11:45am, pt notified, order faxed/[email or web address] [email or web address],LPN

## 2010-12-17 NOTE — Progress Notes (Signed)
Summary: SINUS PROBLEMS,COUGH/TJ rm 4   Vital Signs:  Patient Profile:   55 Years Old Female CC:      head and chest congestion x 4days Height:     65.5 inches (166.37 cm) Weight:      201.50 pounds O2 Sat:      94 % O2 treatment:    Room Air Temp:     99.3 degrees F oral Pulse rate:   80 / minute Resp:     20 per minute BP sitting:   121 / 78  (left arm) Cuff size:   large  Vitals Entered By: Clemens Catholic LPN (May 01, 2010 6:42 PM)                  Updated Prior Medication List: PROZAC 40 MG CAPS (FLUOXETINE HCL) Take 2  tablet by mouth once a day PROAIR HFA 108 (90 BASE) MCG/ACT AERS (ALBUTEROL SULFATE) 2-4 puff inhaled every 4-6 hours as needed SOB SYMBICORT 80-4.5 MCG/ACT AERO (BUDESONIDE-FORMOTEROL FUMARATE) 2 puffs inhaled two times a day FLUTICASONE PROPIONATE 50 MCG/ACT SUSP (FLUTICASONE PROPIONATE) 2 sprays in each nostril daily TRAZODONE HCL 50 MG TABS (TRAZODONE HCL) Take 1 tablet by mouth once a day at bedtime as needed for sleep. FEXOFENADINE HCL 180 MG TABS (FEXOFENADINE HCL)   Current Allergies (reviewed today): PCNHistory of Present Illness History from: patient Chief Complaint: head and chest congestion x 4days History of Present Illness: 55 Years Old Female complains of onset of cold symptoms off an on for 6 months.  Sumie has been using Flonase, 15 days prednisone, nebulizer and has been on 2 courses of antibiotics. No sore throat + cough No pleuritic pain + wheezing + nasal congestion + post-nasal drainage + sinus pain/pressure + chest congestion No itchy/red eyes No earache No hemoptysis No SOB No chills/sweats No fever No nausea No vomiting No abdominal pain No diarrhea No skin rashes No fatigue No myalgias No headache   REVIEW OF SYSTEMS Constitutional Symptoms       Complains of fatigue.     Denies fever, chills, night sweats, weight loss, and weight gain.  Eyes       Complains of glasses.      Denies change in vision, eye  pain, eye discharge, contact lenses, and eye surgery. Ear/Nose/Throat/Mouth       Complains of change in hearing, ear pain, frequent runny nose, sinus problems, sore throat, and hoarseness.      Denies hearing loss/aids, ear discharge, dizziness, frequent nose bleeds, and tooth pain or bleeding.  Respiratory       Complains of productive cough, wheezing, shortness of breath, and bronchitis.      Denies dry cough, asthma, and emphysema/COPD.  Cardiovascular       Denies murmurs, chest pain, and tires easily with exhertion.    Gastrointestinal       Denies stomach pain, nausea/vomiting, diarrhea, constipation, blood in bowel movements, and indigestion. Genitourniary       Denies painful urination, kidney stones, and loss of urinary control. Neurological       Complains of headaches.      Denies paralysis, seizures, and fainting/blackouts. Musculoskeletal       Denies muscle pain, joint pain, joint stiffness, decreased range of motion, redness, swelling, muscle weakness, and gout.  Skin       Denies bruising, unusual mles/lumps or sores, and hair/skin or nail changes.  Psych       Denies mood changes, temper/anger issues, anxiety/stress, speech problems,  depression, and sleep problems. Other Comments: pt c/o SOB, head and chest congestion, HA, and decreased hearing in her RT ear x 4days. no OTCmeds, no fever.   Past History:  Past Medical History: Reviewed history from 12/30/2007 and no changes required. Cervical Stenosis C4-T1, G1 P1   Past Surgical History: Reviewed history from 12/30/2007 and no changes required. Ankle sx  1973, c/sec 1976, Left groin Hernia  1974 Cervical disc surgery (?laminectomy?)   Family History: Reviewed history from 03/01/2010 and no changes required.  Father-alcoholism, DM Mother - HTN, depresssion, alzheimers  pancreatic cancer-GM  Social History: Reviewed history from 12/30/2007 and no changes required. Housewife/childcare provider.  12 yrs  education.  Married to [place].  Lives with husband, mother, and grandson.  Smokes 1/2 ppd for 35 yrs.  No EtOH, no drugs, 3 caffeinated drinks, no regular exercise. Physical Exam General appearance: well developed, well nourished, no acute distress Ears: R TM erythema Nasal: mucosa pink, nonedematous, no septal deviation, turbinates normal Oral/Pharynx: clear PND, no erythema, no exudate, OP patent Chest/Lungs: scattered wheezes adn rhonchi throughout all lobes Heart: regular rate and  rhythm, no murmur MSE: oriented to time, place, and person Assessment New Problems: URI (ICD-465.9)   Plan New Medications/Changes: BACTRIM DS 800-160 MG TABS (SULFAMETHOXAZOLE-TRIMETHOPRIM) 1 by mouth two times a day for 7 days  #14 x 0, 05/01/2010, Hoyt Koch MD  New Orders: New Patient Level III [phone] Pulse Oximetry (single measurment) [94760] Services provided After hours-Weekends-Holidays [99051] Rocephin  250mg  [J0696] Admin of Therapeutic Inj  intramuscular or subcutaneous [96372] Planning Comments:   1)  Take the prescribed antibiotic as instructed.  Shot of rocephin given today per pt's preference. I think she needs eval by ENT since this has been going on for months.  May have nasal polyps or other reasons causing chronic sinusitis and post nasal dripping.  If fever develops or not improving, likely needs CXR and CBC.   2)  Use nasal saline solution (over the counter) at least 3 times a day. 3)  Use over the counter decongestants like Zyrtec-D every 12 hours as needed to help with congestion. 4)  Can take tylenol every 6 hours or motrin every 8 hours for pain or fever. 5)  Follow up with your primary doctor  if no improvement in 5-7 days, sooner if increasing pain, fever, or new symptoms.    The patient and/or caregiver has been counseled thoroughly with regard to medications prescribed including dosage, schedule, interactions, rationale for use, and possible side effects and they  verbalize understanding.  Diagnoses and expected course of recovery discussed and will return if not improved as expected or if the condition worsens. Patient and/or caregiver verbalized understanding.  Prescriptions: BACTRIM DS 800-160 MG TABS (SULFAMETHOXAZOLE-TRIMETHOPRIM) 1 by mouth two times a day for 7 days  #14 x 0   Entered and Authorized by:   Hoyt Koch MD   Signed by:   Hoyt Koch MD on 05/01/2010   Method used:   Print then Give to Patient   RxID:   [id number]   Medication Administration  Injection # 1:    Medication: Rocephin  250mg     Diagnosis: URI (ICD-465.9)    Route: IM    Site: LUOQ gluteus    Exp Date: 03/15/2011    Lot #: [id number]    Mfr: sandoz    Comments: 500mg  given    Patient tolerated injection without complications    Given by: Clemens Catholic LPN (May 01, 2010 7:06 PM)  Orders Added: 1)  New Patient Level III [99203] 2)  Pulse Oximetry (single measurment) [94760] 3)  Services provided After hours-Weekends-Holidays [99051] 4)  Rocephin  250mg  [J0696] 5)  Admin of Therapeutic Inj  intramuscular or subcutaneous [16109]

## 2010-12-17 NOTE — Telephone Encounter (Signed)
  Phone Note Call from Patient   Caller: Patient Summary of Call: pt called and states she has taken medication and now is having redness and welts to face. she states she has taken benadryl and symptoms are better.  per Dr. Georgina Pillion pt was instructed to stop Bactrim and a new prescription was called in to [facility] for cefdinir 300mg  two times a day X10days Initial call taken by: Linton Flemings RN,  May 05, 2010 1:07 PM

## 2011-01-22 ENCOUNTER — Ambulatory Visit (INDEPENDENT_AMBULATORY_CARE_PROVIDER_SITE_OTHER): Payer: Managed Care, Other (non HMO) | Admitting: Physician Assistant

## 2011-01-22 ENCOUNTER — Encounter: Payer: Self-pay | Admitting: Physician Assistant

## 2011-01-22 VITALS — BP 138/74 | HR 63 | Wt 181.0 lb

## 2011-01-22 DIAGNOSIS — J019 Acute sinusitis, unspecified: Secondary | ICD-10-CM

## 2011-01-22 MED ORDER — AZITHROMYCIN 250 MG PO TABS: ORAL_TABLET | ORAL | Status: AC

## 2011-01-22 NOTE — Progress Notes (Signed)
  Subjective:    Patient ID: Sherri [name], female    DOB: [birth year], 56 y.o.   MRN: [id number]  HPI Patient was seen on 12/10/10 for bronchitis. She was given Doxycycline and has since resolved. Soon after that visit she began to have a headache. The headache has been constant for 4 weeks now. She describes the pain as a dull ache starting in the neck and going up into her head behind her eyes. On a scale from 1 to 10 she reports a 15/10 of pain . She has only tried Ibuprofen 200mg  TID. She says that helps minimally. She reports that nothing makes her headache worse but nothing makes it better either. She says it is hard to sleep that she sometimes wakes up at night with the headache. She denies fever, nausea, vomiting, photophobia, or phonophobia. She has had intense sinus pressure even so that her teeth hurt along with dizziness and some blurred vision on occasion. She did have a history of cluster headaches when she was a teenager but they resolved. She takes Allegra daily for seasonal allergies.  Sunday she went to the ER unrealated to this matter with back pain. They gave her prednisone 60mg  for 5 days along with Vicodin. She is on day 2 of treatment with prednisone but has only taken 1 vicodin and said it did not help with headache. She does report being a little better today than she had been.   Review of Systems     Objective:   Physical Exam  Constitutional: She is oriented to person, place, and time. She appears well-developed and well-nourished.  HENT:  Head: Normocephalic and atraumatic.  Right Ear: External ear normal.  Left Ear: External ear normal.  Nose: Nose normal.  Mouth/Throat: Oropharynx is clear and moist. No oropharyngeal exudate.       She pulled away from me due to pain upon palpation of maxillary and frontal sinuses.   Eyes: Right eye exhibits no discharge. Left eye exhibits no discharge.  Cardiovascular: Normal rate, regular rhythm and normal heart sounds.     Pulmonary/Chest: Effort normal and breath sounds normal.  Lymphadenopathy:    She has no cervical adenopathy.  Neurological: She is alert and oriented to person, place, and time.       Cranial nerves II-XII intact.  Psychiatric: She has a normal mood and affect. Her behavior is normal.          Assessment & Plan:  Acute sinusitis- Continue taking prednisone given by ED. Start Zpak for sinus infection since allergic PCN. Symptomatic Care along with Ibuprofen. Stop using allegra until finished with prednisone and Zpak. Follow-up if headache is not improving or worse in 48hours.

## 2011-01-22 NOTE — Patient Instructions (Addendum)
Start Zpak. Stay on prednisone use ibuprofen as needed up 800mg  three times a day. Call office if not better in 48hours. Stop Allegra until sinus infection resolved.

## 2011-01-24 ENCOUNTER — Ambulatory Visit: Payer: Managed Care, Other (non HMO) | Admitting: Family Medicine

## 2011-01-30 ENCOUNTER — Telehealth: Payer: Self-pay | Admitting: *Deleted

## 2011-01-30 DIAGNOSIS — M549 Dorsalgia, unspecified: Secondary | ICD-10-CM

## 2011-01-30 NOTE — Telephone Encounter (Signed)
Pt states she is still having back pain. Went ED on 1 1/2 weeks ago for this and would like a referral to the orthopedic (Dr. Ethelene Hal) if you would put this is in. Would like to be seen at the [place] office.

## 2011-01-30 NOTE — Telephone Encounter (Signed)
Referral entered  

## 2011-02-15 ENCOUNTER — Telehealth: Payer: Self-pay | Admitting: *Deleted

## 2011-02-15 MED ORDER — HYDROCODONE-ACETAMINOPHEN 5-500 MG PO TABS: 1.0000 | ORAL_TABLET | Freq: Two times a day (BID) | ORAL | Status: DC | PRN

## 2011-02-15 NOTE — Telephone Encounter (Signed)
I will fax over 60 tabs of vicodin which should last until her appt.

## 2011-02-15 NOTE — Telephone Encounter (Signed)
Pt called and states that she is having lots of pain and that her appt with Dr. Ethelene Hal is not until 03/14/11 and would like to know if you will call her in something for pain.

## 2011-02-15 NOTE — Telephone Encounter (Signed)
faxed

## 2011-02-26 ENCOUNTER — Encounter: Payer: Self-pay | Admitting: Physician Assistant

## 2011-02-26 ENCOUNTER — Ambulatory Visit (INDEPENDENT_AMBULATORY_CARE_PROVIDER_SITE_OTHER): Payer: Managed Care, Other (non HMO) | Admitting: Physician Assistant

## 2011-02-26 VITALS — BP 134/78 | HR 67 | Wt 180.0 lb

## 2011-02-26 DIAGNOSIS — Z0000 Encounter for general adult medical examination without abnormal findings: Secondary | ICD-10-CM

## 2011-02-26 DIAGNOSIS — Z1211 Encounter for screening for malignant neoplasm of colon: Secondary | ICD-10-CM

## 2011-02-26 DIAGNOSIS — Z1239 Encounter for other screening for malignant neoplasm of breast: Secondary | ICD-10-CM

## 2011-02-26 DIAGNOSIS — H6690 Otitis media, unspecified, unspecified ear: Secondary | ICD-10-CM

## 2011-02-26 DIAGNOSIS — H6691 Otitis media, unspecified, right ear: Secondary | ICD-10-CM

## 2011-02-26 DIAGNOSIS — J40 Bronchitis, not specified as acute or chronic: Secondary | ICD-10-CM

## 2011-02-26 DIAGNOSIS — Z131 Encounter for screening for diabetes mellitus: Secondary | ICD-10-CM

## 2011-02-26 DIAGNOSIS — Z13220 Encounter for screening for lipoid disorders: Secondary | ICD-10-CM

## 2011-02-26 MED ORDER — ALBUTEROL SULFATE HFA 108 (90 BASE) MCG/ACT IN AERS: 2.0000 | INHALATION_SPRAY | RESPIRATORY_TRACT | Status: DC

## 2011-02-26 MED ORDER — PREDNISONE 10 MG PO TABS: 50.0000 mg | ORAL_TABLET | Freq: Every day | ORAL | Status: DC

## 2011-02-26 MED ORDER — FLUTICASONE PROPIONATE 50 MCG/ACT NA SUSP: 2.0000 | Freq: Every day | NASAL | Status: DC

## 2011-02-26 MED ORDER — BUDESONIDE-FORMOTEROL FUMARATE 80-4.5 MCG/ACT IN AERO: 2.0000 | INHALATION_SPRAY | Freq: Two times a day (BID) | RESPIRATORY_TRACT | Status: DC

## 2011-02-26 MED ORDER — DOXYCYCLINE HYCLATE 100 MG PO TABS: 100.0000 mg | ORAL_TABLET | Freq: Two times a day (BID) | ORAL | Status: AC

## 2011-02-26 NOTE — Progress Notes (Signed)
  Subjective:    Patient ID: Sherri [name], female    DOB: June 29, 1955, 56 y.o.   MRN: [id number]  HPI Patient presents to clinic because she has not felt well for the last 2 months. Patient was seen on 01/22/11 and given Zpak and prednisone. She initially felt better but over the last 2 weeks she has had an extreme headache, her scalp has even been tender to the touch, and she has had sinus pressure. She has not felt like this before. She denies fever. She has had some chills and a cough with production. She has been very SOB and wheezing a lot especially at night. She has used ibuprofen and vicodin with little relief.   Review of Systems     Objective:   Physical Exam  Constitutional: She is oriented to person, place, and time. She appears well-developed and well-nourished.  HENT:  Mouth/Throat: No oropharyngeal exudate.       Tenderness to light and deep palpation of the scalp. Right TM dull and erythematous with no purulent drainage.Pain with palpation behind right ear. Oropharynx erythematous with post nasal drip present. Maxillary tenderness bilaterally.  Eyes: Conjunctivae are normal. Right eye exhibits discharge. Left eye exhibits discharge.       Clear watery discharge.  Neck:       Anterior cervical lymphnode enlarged bilaterally with tenderness to palpation.  Cardiovascular: Normal rate, regular rhythm and normal heart sounds.   Pulmonary/Chest: No respiratory distress. She has wheezes. She exhibits no tenderness.       Not able to take a full deep breath. Wheezing throughout bilateral lungs on expiration.  Lymphadenopathy:    She has cervical adenopathy.  Neurological: She is alert and oriented to person, place, and time.  Psychiatric: She has a normal mood and affect. Her behavior is normal.          Assessment & Plan:  Right Otitis Media- Doxycycline 100mg  BID for 10 days.   Bronchitis-Doxycycline 100mg  BID for 10days. Prednisone 50mg  for 5 days.   Screening for  lipoid disorder an diabetes-Ordered lipid panel, CMP, HgA1C Will call with results. Need to schedule complete physical.  Will schedule Colonoscopy and mammogram.

## 2011-02-26 NOTE — Patient Instructions (Addendum)
Refilled Symicort and albuterol. Start Doxycyline twice a day for 10 days. Start prednisone 50mg  for 5 days. Continue to use inhalers for shortness of breath. Continue to use ibuprofen for headaches. Call office if SOB worsen or not improving.   Will call with colonoscopy and mammogram. Consider making appointment for CPE when feeling better. We need to do a spirometry when feeling better to evaluate lung function.

## 2011-03-12 ENCOUNTER — Ambulatory Visit
Admission: RE | Admit: 2011-03-12 | Discharge: 2011-03-12 | Disposition: A | Discharge status: Home / Self Care | Payer: Managed Care, Other (non HMO) | Source: Ambulatory Visit | Attending: Physician Assistant | Admitting: Physician Assistant

## 2011-03-12 DIAGNOSIS — Z1239 Encounter for other screening for malignant neoplasm of breast: Secondary | ICD-10-CM

## 2011-03-14 ENCOUNTER — Telehealth: Payer: Self-pay | Admitting: *Deleted

## 2011-03-14 NOTE — Telephone Encounter (Signed)
Pt called and states that she ween Dr. Ethelene Hal today and that he had approved her to take the Vicodin. I informed pt that she would have to call him for the refills on the Vicodin. FYI

## 2011-03-18 ENCOUNTER — Other Ambulatory Visit: Payer: Self-pay | Admitting: Physician Assistant

## 2011-03-18 DIAGNOSIS — R928 Other abnormal and inconclusive findings on diagnostic imaging of breast: Secondary | ICD-10-CM

## 2011-03-26 ENCOUNTER — Ambulatory Visit
Admission: RE | Admit: 2011-03-26 | Discharge: 2011-03-26 | Disposition: A | Discharge status: Home / Self Care | Payer: Managed Care, Other (non HMO) | Source: Ambulatory Visit | Attending: Physician Assistant | Admitting: Physician Assistant

## 2011-03-26 ENCOUNTER — Other Ambulatory Visit: Payer: Self-pay | Admitting: Physician Assistant

## 2011-03-26 DIAGNOSIS — R928 Other abnormal and inconclusive findings on diagnostic imaging of breast: Secondary | ICD-10-CM

## 2011-05-06 ENCOUNTER — Other Ambulatory Visit: Payer: Self-pay | Admitting: Family Medicine

## 2011-05-13 ENCOUNTER — Ambulatory Visit (INDEPENDENT_AMBULATORY_CARE_PROVIDER_SITE_OTHER): Payer: Managed Care, Other (non HMO) | Admitting: Physician Assistant

## 2011-05-13 ENCOUNTER — Encounter: Payer: Self-pay | Admitting: Physician Assistant

## 2011-05-13 VITALS — BP 124/74 | HR 74 | Temp 98.4°F | Ht 65.5 in | Wt 171.0 lb

## 2011-05-13 DIAGNOSIS — J441 Chronic obstructive pulmonary disease with (acute) exacerbation: Secondary | ICD-10-CM

## 2011-05-13 MED ORDER — METHYLPREDNISOLONE ACETATE 80 MG/ML IJ SUSP
80.0000 mg | Freq: Once | INTRAMUSCULAR | Status: AC
  Administered 2011-05-13: 80 mg via INTRAMUSCULAR

## 2011-05-13 MED ORDER — AZITHROMYCIN 250 MG PO TABS: ORAL_TABLET | ORAL | Status: AC

## 2011-05-13 NOTE — Progress Notes (Signed)
  Subjective:    Patient ID: Sherri [name], female    DOB: 12/01/1955, 56 y.o.   MRN: [id number]  HPI Patient has had bad allergies the whole season. She has been using her flonase daily along with OTC anti-histamines. She has COPD and is usually controlled with symbicort. She recently stopped smoking 2 weeks ago. For last 4 days her SOB and wheezing have gotten worse. She is having to use albuterol inhaler 2 times a day for last 3 days. She denies any sinus pressure or headache but has had sore throat and ear pain. Her cough is very productive but doesn't have any fever. She is supposed to have breast reduction surgery in the near future and needs to be well.    Review of Systems     Objective:   Physical Exam  Constitutional: She is oriented to person, place, and time. She appears well-developed and well-nourished.  HENT:  Head: Normocephalic and atraumatic.  Right Ear: External ear normal.  Left Ear: External ear normal.  Nose: Nose normal.  Mouth/Throat: Oropharynx is clear and moist. No oropharyngeal exudate.  Eyes: Conjunctivae are normal.  Neck: Normal range of motion. Neck supple.  Cardiovascular: Normal rate, regular rhythm and normal heart sounds.   Pulmonary/Chest: Effort normal and breath sounds normal.       Bilateral wheezing throughout lungs. Increased sputum production.  Lymphadenopathy:    She has no cervical adenopathy.  Neurological: She is alert and oriented to person, place, and time.  Skin: Skin is warm and dry.  Psychiatric: She has a normal mood and affect. Her behavior is normal.          Assessment & Plan:  COPD exacerbation- SpO2 was 95%. Depo Medrol shot given. Zpak to start for 5 days. Continue to use albuterol inhaler as needed for SOB but should need less and less over next couple of days. Call if not improving in next 3 days.    Is aware she needs colonoscopy. Has referral for later this year. She is also aware she needs pap smear.

## 2011-05-13 NOTE — Patient Instructions (Signed)
Gave shot in office. STart zpack 2 pills for first day and the 1 pill for next 4 days. Continue use albuterol nebulizer goal is not be using. If not improving in 3 days give [place] a call.

## 2011-06-14 ENCOUNTER — Other Ambulatory Visit: Payer: Self-pay | Admitting: Family Medicine

## 2011-06-18 ENCOUNTER — Encounter (HOSPITAL_BASED_OUTPATIENT_CLINIC_OR_DEPARTMENT_OTHER): Payer: Self-pay | Admitting: *Deleted

## 2011-06-18 NOTE — Progress Notes (Signed)
No labs needed for anesthesia- Bring all meds and inhalers, overnight bag

## 2011-06-24 ENCOUNTER — Ambulatory Visit (HOSPITAL_BASED_OUTPATIENT_CLINIC_OR_DEPARTMENT_OTHER): Payer: Managed Care, Other (non HMO) | Admitting: Anesthesiology

## 2011-06-24 ENCOUNTER — Ambulatory Visit (HOSPITAL_BASED_OUTPATIENT_CLINIC_OR_DEPARTMENT_OTHER)
Admission: RE | Admit: 2011-06-24 | Discharge: 2011-06-25 | Disposition: A | Discharge status: Home / Self Care | Payer: Managed Care, Other (non HMO) | Source: Ambulatory Visit | Attending: Specialist | Admitting: Specialist

## 2011-06-24 ENCOUNTER — Encounter (HOSPITAL_BASED_OUTPATIENT_CLINIC_OR_DEPARTMENT_OTHER): Payer: Self-pay | Admitting: Anesthesiology

## 2011-06-24 ENCOUNTER — Encounter (HOSPITAL_BASED_OUTPATIENT_CLINIC_OR_DEPARTMENT_OTHER)
Admission: RE | Disposition: A | Discharge status: Home / Self Care | Payer: Self-pay | Source: Ambulatory Visit | Attending: Specialist

## 2011-06-24 ENCOUNTER — Encounter (HOSPITAL_BASED_OUTPATIENT_CLINIC_OR_DEPARTMENT_OTHER): Payer: Self-pay | Admitting: *Deleted

## 2011-06-24 DIAGNOSIS — F17200 Nicotine dependence, unspecified, uncomplicated: Secondary | ICD-10-CM | POA: Insufficient documentation

## 2011-06-24 DIAGNOSIS — N62 Hypertrophy of breast: Secondary | ICD-10-CM | POA: Insufficient documentation

## 2011-06-24 DIAGNOSIS — J449 Chronic obstructive pulmonary disease, unspecified: Secondary | ICD-10-CM | POA: Insufficient documentation

## 2011-06-24 DIAGNOSIS — F411 Generalized anxiety disorder: Secondary | ICD-10-CM | POA: Insufficient documentation

## 2011-06-24 DIAGNOSIS — F3289 Other specified depressive episodes: Secondary | ICD-10-CM | POA: Insufficient documentation

## 2011-06-24 DIAGNOSIS — F329 Major depressive disorder, single episode, unspecified: Secondary | ICD-10-CM | POA: Insufficient documentation

## 2011-06-24 DIAGNOSIS — J4489 Other specified chronic obstructive pulmonary disease: Secondary | ICD-10-CM | POA: Insufficient documentation

## 2011-06-24 HISTORY — PX: BREAST REDUCTION SURGERY: SHX8

## 2011-06-24 HISTORY — DX: Depression, unspecified: F32.A

## 2011-06-24 HISTORY — DX: Pneumonia, unspecified organism: J18.9

## 2011-06-24 HISTORY — DX: Anxiety disorder, unspecified: F41.9

## 2011-06-24 HISTORY — DX: Unspecified osteoarthritis, unspecified site: M19.90

## 2011-06-24 HISTORY — DX: Chronic obstructive pulmonary disease, unspecified: J44.9

## 2011-06-24 HISTORY — DX: Major depressive disorder, single episode, unspecified: F32.9

## 2011-06-24 HISTORY — DX: Acute upper respiratory infection, unspecified: J06.9

## 2011-06-24 LAB — POCT HEMOGLOBIN-HEMACUE: Hemoglobin: 14.2 g/dL (ref 12.0–15.0)

## 2011-06-24 SURGERY — MAMMOPLASTY, REDUCTION
Anesthesia: General | Site: Breast | Laterality: Bilateral | Wound class: Clean

## 2011-06-24 MED ORDER — EPHEDRINE SULFATE 50 MG/ML IJ SOLN
INTRAMUSCULAR | Status: DC | PRN
  Administered 2011-06-24 (×2): 10 mg via INTRAVENOUS

## 2011-06-24 MED ORDER — NABUMETONE 500 MG PO TABS: 500.0000 mg | ORAL_TABLET | Freq: Two times a day (BID) | ORAL | Status: DC

## 2011-06-24 MED ORDER — METOCLOPRAMIDE HCL 5 MG/ML IJ SOLN: 10.0000 mg | Freq: Once | INTRAMUSCULAR | Status: DC | PRN

## 2011-06-24 MED ORDER — FLUOXETINE HCL 40 MG PO CAPS
40.0000 mg | ORAL_CAPSULE | Freq: Two times a day (BID) | ORAL | Status: DC
  Administered 2011-06-24: 40 mg via ORAL

## 2011-06-24 MED ORDER — CIPROFLOXACIN IN D5W 400 MG/200ML IV SOLN
400.0000 mg | INTRAVENOUS | Status: AC
  Administered 2011-06-24: 400 mg via INTRAVENOUS

## 2011-06-24 MED ORDER — DIPHENHYDRAMINE HCL 50 MG/ML IJ SOLN
12.5000 mg | Freq: Four times a day (QID) | INTRAMUSCULAR | Status: DC | PRN
  Administered 2011-06-24: 12.5 mg via INTRAVENOUS

## 2011-06-24 MED ORDER — ONDANSETRON HCL 4 MG/2ML IJ SOLN
INTRAMUSCULAR | Status: DC | PRN
  Administered 2011-06-24: 4 mg via INTRAVENOUS

## 2011-06-24 MED ORDER — SODIUM CHLORIDE 0.9 % IV SOLN
1.0000 mg/h | INTRAVENOUS | Status: DC
  Administered 2011-06-24: 1 mg/h via INTRAVENOUS
  Filled 2011-06-24: qty 6.67

## 2011-06-24 MED ORDER — LACTATED RINGERS IV SOLN
INTRAVENOUS | Status: DC
  Administered 2011-06-24 (×3): via INTRAVENOUS

## 2011-06-24 MED ORDER — CIPROFLOXACIN IN D5W 400 MG/200ML IV SOLN: 400.0000 mg | INTRAVENOUS | Status: DC

## 2011-06-24 MED ORDER — DEXAMETHASONE SODIUM PHOSPHATE 4 MG/ML IJ SOLN
INTRAMUSCULAR | Status: DC | PRN
  Administered 2011-06-24: 10 mg via INTRAVENOUS

## 2011-06-24 MED ORDER — FENTANYL CITRATE 0.05 MG/ML IJ SOLN
INTRAMUSCULAR | Status: DC | PRN
  Administered 2011-06-24: 50 ug via INTRAVENOUS
  Administered 2011-06-24: 25 ug via INTRAVENOUS
  Administered 2011-06-24: 50 ug via INTRAVENOUS
  Administered 2011-06-24 (×3): 25 ug via INTRAVENOUS

## 2011-06-24 MED ORDER — ACETAMINOPHEN 10 MG/ML IV SOLN
1000.0000 mg | Freq: Once | INTRAVENOUS | Status: AC
  Administered 2011-06-24: 1000 mg via INTRAVENOUS

## 2011-06-24 MED ORDER — LIDOCAINE HCL (CARDIAC) 20 MG/ML IV SOLN
INTRAVENOUS | Status: DC | PRN
  Administered 2011-06-24: 60 mg via INTRAVENOUS

## 2011-06-24 MED ORDER — ALBUTEROL SULFATE HFA 108 (90 BASE) MCG/ACT IN AERS: 2.0000 | INHALATION_SPRAY | RESPIRATORY_TRACT | Status: DC

## 2011-06-24 MED ORDER — PROPOFOL 10 MG/ML IV EMUL
INTRAVENOUS | Status: DC | PRN
  Administered 2011-06-24: 300 mg via INTRAVENOUS

## 2011-06-24 MED ORDER — LIDOCAINE-EPINEPHRINE 0.5-1:200000 % IJ SOLN: INTRAMUSCULAR | Status: DC | PRN

## 2011-06-24 MED ORDER — OXYCODONE HCL 5 MG PO TABS: 5.0000 mg | ORAL_TABLET | Freq: Once | ORAL | Status: DC | PRN

## 2011-06-24 MED ORDER — HYDROMORPHONE HCL PF 1 MG/ML IJ SOLN
0.2500 mg | INTRAMUSCULAR | Status: DC | PRN
  Administered 2011-06-24 (×2): 0.5 mg via INTRAVENOUS

## 2011-06-24 MED ORDER — TRAZODONE HCL 50 MG PO TABS: 50.0000 mg | ORAL_TABLET | Freq: Every evening | ORAL | Status: DC | PRN

## 2011-06-24 MED ORDER — DROPERIDOL 2.5 MG/ML IJ SOLN
INTRAMUSCULAR | Status: DC | PRN
  Administered 2011-06-24: 0.625 mg via INTRAVENOUS

## 2011-06-24 MED ORDER — FLUTICASONE PROPIONATE 50 MCG/ACT NA SUSP: 2.0000 | Freq: Every day | NASAL | Status: DC

## 2011-06-24 MED ORDER — OXYCODONE-ACETAMINOPHEN 5-325 MG PO TABS
1.0000 | ORAL_TABLET | ORAL | Status: DC | PRN
  Administered 2011-06-24 – 2011-06-25 (×3): 1 via ORAL

## 2011-06-24 MED ORDER — DEXTROSE IN LACTATED RINGERS 5 % IV SOLN
INTRAVENOUS | Status: DC
  Administered 2011-06-24 (×2): via INTRAVENOUS

## 2011-06-24 MED ORDER — MORPHINE SULFATE (PF) 1 MG/ML IV SOLN
INTRAVENOUS | Status: DC
  Administered 2011-06-24: 4.5 mL via INTRAVENOUS
  Administered 2011-06-24: 1 mg via INTRAVENOUS
  Administered 2011-06-24: 12:00:00 via INTRAVENOUS

## 2011-06-24 MED ORDER — BUDESONIDE-FORMOTEROL FUMARATE 80-4.5 MCG/ACT IN AERO
2.0000 | INHALATION_SPRAY | Freq: Two times a day (BID) | RESPIRATORY_TRACT | Status: DC
  Administered 2011-06-24: 2 via RESPIRATORY_TRACT

## 2011-06-24 SURGICAL SUPPLY — 56 items
BAG DECANTER FOR FLEXI CONT (MISCELLANEOUS) ×2 IMPLANT
BENZOIN TINCTURE PRP APPL 2/3 (GAUZE/BANDAGES/DRESSINGS) ×4 IMPLANT
BLADE KNIFE  20 PERSONNA (BLADE) ×2
BLADE KNIFE 20 PERSONNA (BLADE) ×2 IMPLANT
BLADE KNIFE PERSONA 10 (BLADE) ×6 IMPLANT
BLADE KNIFE PERSONA 15 (BLADE) ×2 IMPLANT
CANISTER SUCTION 1200CC (MISCELLANEOUS) ×2 IMPLANT
CLOTH BEACON ORANGE TIMEOUT ST (SAFETY) ×2 IMPLANT
COVER MAYO STAND STRL (DRAPES) ×2 IMPLANT
COVER TABLE BACK 60X90 (DRAPES) ×2 IMPLANT
DECANTER SPIKE VIAL GLASS SM (MISCELLANEOUS) ×4 IMPLANT
DRAIN CHANNEL 10F 3/8 F FF (DRAIN) ×4 IMPLANT
DRAPE LAPAROSCOPIC ABDOMINAL (DRAPES) ×2 IMPLANT
DRAPE UTILITY XL STRL (DRAPES) ×2 IMPLANT
DRSG PAD ABDOMINAL 8X10 ST (GAUZE/BANDAGES/DRESSINGS) ×8 IMPLANT
ELECT REM PT RETURN 9FT ADLT (ELECTROSURGICAL) ×2
ELECTRODE REM PT RTRN 9FT ADLT (ELECTROSURGICAL) ×1 IMPLANT
EVACUATOR SILICONE 100CC (DRAIN) ×4 IMPLANT
FILTER 7/8 IN (FILTER) IMPLANT
GAUZE XEROFORM 5X9 LF (GAUZE/BANDAGES/DRESSINGS) ×4 IMPLANT
GLOVE BIO SURGEON STRL SZ 6.5 (GLOVE) ×2 IMPLANT
GLOVE BIOGEL M STRL SZ7.5 (GLOVE) ×4 IMPLANT
GLOVE BIOGEL PI IND STRL 8 (GLOVE) ×1 IMPLANT
GLOVE BIOGEL PI INDICATOR 8 (GLOVE) ×1
GLOVE ECLIPSE 7.0 STRL STRAW (GLOVE) ×2 IMPLANT
GOWN PREVENTION PLUS XXLARGE (GOWN DISPOSABLE) ×4 IMPLANT
IV NS 500ML (IV SOLUTION) ×1
IV NS 500ML BAXH (IV SOLUTION) ×1 IMPLANT
NEEDLE SPNL 18GX3.5 QUINCKE PK (NEEDLE) ×2 IMPLANT
NS IRRIG 1000ML POUR BTL (IV SOLUTION) IMPLANT
PACK BASIN DAY SURGERY FS (CUSTOM PROCEDURE TRAY) ×2 IMPLANT
PEN SKIN MARKING BROAD TIP (MISCELLANEOUS) ×2 IMPLANT
PILLOW FOAM RUBBER ADULT (PILLOWS) ×2 IMPLANT
PIN SAFETY STERILE (MISCELLANEOUS) ×2 IMPLANT
SHEETING SILICONE GEL EPI DERM (MISCELLANEOUS) IMPLANT
SPECIMEN JAR MEDIUM (MISCELLANEOUS) IMPLANT
SPECIMEN JAR X LARGE (MISCELLANEOUS) ×4 IMPLANT
SPONGE GAUZE 4X4 12PLY (GAUZE/BANDAGES/DRESSINGS) ×4 IMPLANT
SPONGE LAP 18X18 X RAY DECT (DISPOSABLE) ×8 IMPLANT
STRIP SUTURE WOUND CLOSURE 1/2 (SUTURE) ×10 IMPLANT
SUT MNCRL AB 3-0 PS2 18 (SUTURE) ×12 IMPLANT
SUT MON AB 2-0 CT1 36 (SUTURE) IMPLANT
SUT MON AB 5-0 PS2 18 (SUTURE) ×4 IMPLANT
SUT PROLENE 3 0 PS 2 (SUTURE) ×12 IMPLANT
SYR 50ML LL SCALE MARK (SYRINGE) ×4 IMPLANT
SYR CONTROL 10ML LL (SYRINGE) IMPLANT
TAPE HYPAFIX 6X30 (GAUZE/BANDAGES/DRESSINGS) ×2 IMPLANT
TAPE MEASURE 72IN RETRACT (INSTRUMENTS)
TAPE MEASURE LINEN 72IN RETRCT (INSTRUMENTS) IMPLANT
TAPE PAPER MEDFIX 1IN X 10YD (GAUZE/BANDAGES/DRESSINGS) ×2 IMPLANT
TOWEL OR NON WOVEN STRL DISP B (DISPOSABLE) ×2 IMPLANT
TUBE CONNECTING 20X1/4 (TUBING) ×2 IMPLANT
UNDERPAD 30X30 INCONTINENT (UNDERPADS AND DIAPERS) ×6 IMPLANT
VAC PENCILS W/TUBING CLEAR (MISCELLANEOUS) ×2 IMPLANT
WATER STERILE IRR 1000ML POUR (IV SOLUTION) ×2 IMPLANT
YANKAUER SUCT BULB TIP NO VENT (SUCTIONS) ×2 IMPLANT

## 2011-06-24 NOTE — Transfer of Care (Signed)
Immediate Anesthesia Transfer of Care Note  Patient: Sherri [name]  Procedure(s) Performed: Procedure(s) (LRB): MAMMARY REDUCTION  (BREAST) (Bilateral)  Patient Location: PACU  Anesthesia Type: General  Level of Consciousness: awake and alert   Airway & Oxygen Therapy: Patient Spontanous Breathing and Patient connected to face mask oxygen  Post-op Assessment: Report given to PACU RN and Post -op Vital signs reviewed and stable  Post vital signs: Reviewed and stable  Complications: No apparent anesthesia complications

## 2011-06-24 NOTE — Op Note (Signed)
NAMETHERSIA, Sherri [name]             ACCOUNT NO.:  [id number]  MEDICAL RECORD NO.:  [id number]  LOCATION:                                 FACILITY:  PHYSICIAN:  Earvin Hansen L. Shon Hough, M.D.   DATE OF BIRTH:  DATE OF PROCEDURE:  06/24/2011 DATE OF DISCHARGE:                              OPERATIVE REPORT   This is a 56 year old lady has severe macromastia, back and shoulder pain secondary to large pendulous breasts, intertriginous changes by history, failure to improve symptoms with intensive conservative treatment including heat pads, cold packs, and using special bras made which she has to order approximately every 6-8 weeks.  PROCEDURES:  Planned bilateral breast reductions using the inferior pedicle technique.  ANESTHESIA:  General.  DESCRIPTION OF PROCEDURE:  Preoperatively, the patient was sat up and drawn for the reduction mammoplasty, inferior pedicle reduction.  She underwent then general anesthesia.  She was lifted preoperatively from over 40 cm-24 cm from the suprasternal notch.  After receiving general anesthesia and intubated, prep was done to the chest and breast areas in routine fashion using Hibiclens soap and solution, walled off with sterile towels and drapes so as to make a sterile field.  0.25% Xylocaine with epinephrine was injected locally 1:200,000 concentration. A total of 200 mL per side.  This was allowed to sit up.  The wounds were scored with a 15 blade and the skin of the inferior pedicle was de- epithelialized with #20 blade, medial and lateral fatty dermal pedicles were incised down the underlying fascia and out laterally.  Excessive accessory breast tissue was excised sharply with the Bovie on a cutting and coagulation.  After proper hemostasis, the new keyhole area was debrided and flaps were then transposed and stayed with 3-0 Prolene after proper hemostasis.  Subcutaneous closed on the wounds with 3-0 Monocryl x2 layers and running subcuticular  stitch of 3-0 Monocryl and 5- 0 Monocryl throughout the inverted T.  The wounds were then drained with #10 fully fluted Blake.  The drains which were placed in the depths of wound and brought out through the lateral-most portion of the incision secured with 3-0 Prolene.  The wounds were cleansed, Steri-Strips and soft dressing applied in all the areas.  We removed over 700 g on the left side which was slightly small and over 900 g on the right. Xeroform, 4x4s, ABDs, Hypafix tape were placed.  At the end of the procedure, the nipple-areolar complexes were examined showing excellent pinkish coloration and suppleness.  ESTIMATED BLOOD LOSS:  150 mL.  COMPLICATIONS:  None.     Yaakov Guthrie. Shon Hough, M.D.    Cathie Hoops  D:  06/24/2011  T:  06/24/2011  Job:  [id number]

## 2011-06-24 NOTE — Brief Op Note (Signed)
06/24/2011  10:11 AM  PATIENT:  Sherri [name]  56 y.o. female  PRE-OPERATIVE DIAGNOSIS:  macromastia  POST-OPERATIVE DIAGNOSIS:  macromastia  PROCEDURE:  Procedure(s) (LRB): MAMMARY REDUCTION  (BREAST) (Bilateral)  SURGEON:  Surgeon(s) and Role:    * Louisa Second, MD - Primary  PHYSICIAN ASSISTANT:   ASSISTANTS: none   ANESTHESIA:   general  EBL:  Total I/O In: 1000 [I.V.:1000] Out: -   BLOOD ADMINISTERED:none  DRAINS: (right and left lateral incision areas) Jackson-Pratt drain(s) with closed bulb suction in the right and left lateral chest areas   LOCAL MEDICATIONS USED:  LIDOCAINE   SPECIMEN:  Excision  DISPOSITION OF SPECIMEN:  PATHOLOGY  COUNTS:  YES  TOURNIQUET:  * No tourniquets in log *  DICTATION: .Other Dictation: Dictation Number [id number]  PLAN OF CARE: Admit for overnight observation  PATIENT DISPOSITION:  PACU - hemodynamically stable.   Delay start of Pharmacological VTE agent (>24hrs) due to surgical blood loss or risk of bleeding: not applicable

## 2011-06-24 NOTE — Anesthesia Procedure Notes (Signed)
Procedure Name: LMA Insertion Performed by: Pamella Samons W Pre-anesthesia Checklist: Patient identified, Timeout performed, Emergency Drugs available, Suction available and Patient being monitored Patient Re-evaluated:Patient Re-evaluated prior to inductionOxygen Delivery Method: Circle system utilized Preoxygenation: Pre-oxygenation with 100% oxygen Intubation Type: IV induction Ventilation: Mask ventilation without difficulty LMA: LMA inserted LMA Size: 4.0 Number of attempts: 1 Placement Confirmation: breath sounds checked- equal and bilateral and positive ETCO2 Tube secured with: Tape Dental Injury: Teeth and Oropharynx as per pre-operative assessment      

## 2011-06-24 NOTE — Anesthesia Preprocedure Evaluation (Signed)
Anesthesia Evaluation  Patient identified by MRN, date of birth, ID band Patient awake    Reviewed: Allergy & Precautions, H&P , NPO status , Patient's Chart, lab work & pertinent test results, reviewed documented beta blocker date and time   Airway Mallampati: II TM Distance: >3 FB Neck ROM: full    Dental   Pulmonary asthma , pneumonia , COPD COPD inhaler, Recent URI , Resolved, Current Smoker,          Cardiovascular negative cardio ROS      Neuro/Psych PSYCHIATRIC DISORDERS negative neurological ROS     GI/Hepatic negative GI ROS, Neg liver ROS,   Endo/Other  negative endocrine ROS  Renal/GU negative Renal ROS  negative genitourinary   Musculoskeletal   Abdominal   Peds  Hematology negative hematology ROS (+)   Anesthesia Other Findings See surgeon's H&P   Reproductive/Obstetrics negative OB ROS                           Anesthesia Physical Anesthesia Plan  ASA: III  Anesthesia Plan: General   Post-op Pain Management:    Induction: Intravenous  Airway Management Planned: LMA  Additional Equipment:   Intra-op Plan:   Post-operative Plan: Extubation in OR  Informed Consent: I have reviewed the patients History and Physical, chart, labs and discussed the procedure including the risks, benefits and alternatives for the proposed anesthesia with the patient or authorized representative who has indicated his/her understanding and acceptance.   Dental Advisory Given  Plan Discussed with: CRNA and Surgeon  Anesthesia Plan Comments:         Anesthesia Quick Evaluation

## 2011-06-24 NOTE — H&P (Signed)
Sherri [name] is an 56 y.o. female.   Chief Complaint: Increased back and shoulder pain HPI: symptoms have increased even with conservative modalities  Past Medical History  Diagnosis Date  . COPD (chronic obstructive pulmonary disease)   . Asthma   . Arthritis   . Anxiety   . Depression   . Pneumonia   . Recurrent upper respiratory infection (URI)     Past Surgical History  Procedure Date  . Ankle sx 1973  . Cesarean section 1976  . Left groin hernia 1974  . Cervical disc surgery     (? laminectomy?)  . Tonsillectomy   . Appendectomy     Family History  Problem Relation Age of Onset  . Hypertension Mother   . Depression Mother   . Alzheimer's disease Mother   . Alcohol abuse Father   . Diabetes Father   . Cancer Other     pancreatic   Social History:  reports that she quit smoking about 8 weeks ago. Her smoking use included Cigarettes. She has a 17.5 pack-year smoking history. She does not have any smokeless tobacco history on file. She reports that she does not drink alcohol or use illicit drugs.  Allergies:  Allergies  Allergen Reactions  . Penicillins     REACTION: hives    Medications Prior to Admission  Medication Sig Dispense Refill  . budesonide-formoterol (SYMBICORT) 80-4.5 MCG/ACT inhaler Inhale 2 puffs into the lungs 2 (two) times daily.  1 Inhaler  6  . FLUoxetine (PROZAC) 40 MG capsule TAKE TWO CAPSULES BY MOUTH DAILY  60 capsule  2  . fluticasone (FLONASE) 50 MCG/ACT nasal spray Place 2 sprays into the nose daily.  16 g  6  . HYDROcodone-acetaminophen (VICODIN) 5-500 MG per tablet Take 1-2 tablets by mouth 2 (two) times daily as needed.  60 tablet  0  . nabumetone (RELAFEN) 500 MG tablet Take 1 tablet (500 mg total) by mouth 2 (two) times daily.  60 tablet  1  . traZODone (DESYREL) 50 MG tablet TAKE 1 TABLET BY MOUTH AT BEDTIME FOR SLEEP.  30 tablet  2  . albuterol (PROAIR HFA) 108 (90 BASE) MCG/ACT inhaler Inhale 2 puffs into the lungs as  directed. 2-4 puffs inhaled every 4-6 hours prn SOB  1 Inhaler  6    Results for orders placed during the hospital encounter of 06/24/11 (from the past 48 hour(s))  POCT HEMOGLOBIN-HEMACUE     Status: Normal   Collection Time   06/24/11  6:53 AM      Component Value Range Comment   Hemoglobin 14.2  12.0 - 15.0 (g/dL)    No results found.  Review of Systems  Constitutional: Negative.   Eyes: Negative.   Respiratory: Negative.   Cardiovascular: Negative.   Gastrointestinal: Negative.   Genitourinary: Negative.   Musculoskeletal: Negative.   Skin: Positive for rash.  Neurological: Negative.   Endo/Heme/Allergies: Negative.   Psychiatric/Behavioral: Negative.     Blood pressure 140/76, pulse 58, temperature 98.5 F (36.9 C), temperature source Oral, resp. rate 18, weight 72.576 kg (160 lb), SpO2 96.00%. Physical Exam   Assessment/Plan Bilateral breast reductions  Jullia Mulligan L 06/24/2011, 7:30 AM

## 2011-06-24 NOTE — Discharge Instructions (Signed)
Keep head of bed elevated 45 degrees at home  No driving or lifting over [id number]  Keep dressings dry and clean  Breast Reduction Care After Refer to this sheet in the next few weeks. These instructions provide you with information on caring for yourself after your procedure. Your caregiver may also give you more specific instructions. Your treatment has been planned according to current medical practices, but problems sometimes occur. Call your caregiver if you have any problems or questions after your procedure. HOME CARE INSTRUCTIONS  Do not lift more than 5 pounds with one arm, or 10 pounds with both arms, for 1 month.   Do not sleep on your stomach for 4 to 6 weeks.   Do not do vigorous exercise such as bouncing, aerobics, or jumping for 6 weeks. Walking is not restricted.   Do not drive while you are taking prescription pain medicine.   Avoid prolonged sun exposure.   Wear a bra 24 hours a day for 1 month, unless otherwise instructed. You may remove your bra while bathing.   Do not wear underwire bras.   You may slowly go back to your normal diet. Start with a light meal and increase as comfortable.   You may shower 24 hours after your drains are removed unless instructed differently by your caregiver.   Take your pain medicine as prescribed. Discomfort is normal after breast reduction surgery.   Apply ointment as instructed by your caregiver. Occasionally, "crusting" occurs, usually under the breast. This small skin loss is fairly normal and can be managed with an antibiotic ointment.  SEEK IMMEDIATE MEDICAL CARE IF:   You have a fever.   You notice drainage from the incision that smells bad.   You have persistent pain.   You have persistent bleeding from the incision or nipple discharge.   You develop increased swelling or swelling that is greater in one breast than in the other.  MAKE SURE YOU:   Understand these instructions.   Will watch your condition.  Will get  help right away if you are not doing well or get worse.  Post Anesthesia Home Care Instructions  Activity: Get plenty of rest for the remainder of the day. A responsible adult should stay with you for 24 hours following the procedure.  For the next 24 hours, DO NOT: -Drive a car -Advertising copywriter -Drink alcoholic beverages -Take any medication unless instructed by your physician -Make any legal decisions or sign important papers.  Meals: Start with liquid foods such as gelatin or soup. Progress to regular foods as tolerated. Avoid greasy, spicy, heavy foods. If nausea and/or vomiting occur, drink only clear liquids until the nausea and/or vomiting subsides. Call your physician if vomiting continues.  Special Instructions/Symptoms: Your throat may feel dry or sore from the anesthesia or the breathing tube placed in your throat during surgery. If this causes discomfort, gargle with warm salt water. The discomfort should disappear within 24 hours.       Jackson-Pratt Bulb Drain  What is a Jackson-Pratt bulb? A Jackson-Pratt is a soft, round device used to collect drainage. It is connected to a long, thin drainage catheter, which is held in place by one or two small stiches near your surgical incision site. When the bulb is squeezed, it forms a vacuum, forcing the drainage to empty into the bulb.  Emptying the Jackson-Pratt bulb- To empty the bulb: 1. Release the plug on the top of the bulb. 2. Pour the bulb's contents into  a measuring container which your nurse will provide. 3. Record the time emptied and amount of drainage. Empty the drain(s) as often as your     doctor or nurse recommends.  Date                  Time                    Amount (Drain 1)                 Amount (Drain  2)  _____________________________________________________________________  _____________________________________________________________________  _____________________________________________________________________  _____________________________________________________________________  _____________________________________________________________________  _____________________________________________________________________  _____________________________________________________________________  _____________________________________________________________________  Squeezing the Jackson-Pratt Bulb-  To squeeze the bulb: 1. Make sure the plug at the top of the bulb is open. 2. Squeeze the bulb tightly in your fist. You will hear air squeezing from the bulb. 3. Replace the plug while the bulb is squeezed. 4. Use a safety pin to attach the bulb to your clothing. This will keep the catheter from     pulling at the bulb insertion site.  When to call your doctor-  Call your doctor if: Drain site becomes red, swollen or hot. You have a fever greater than 101 degrees F. There is oozing at the drain site. Drain falls out (apply a guaze bandage over the drain hole and secure it with tape). Drainage increases daily not related to activity patterns. (You will usually have more drainage when you are active than when you are resting.) Drainage has a bad odor.

## 2011-06-24 NOTE — Anesthesia Postprocedure Evaluation (Signed)
Anesthesia Post Note  Patient: Sherri [name]  Procedure(s) Performed: Procedure(s) (LRB): MAMMARY REDUCTION  (BREAST) (Bilateral)  Anesthesia type: General  Patient location: PACU  Post pain: Pain level controlled  Post assessment: Patient's Cardiovascular Status Stable  Last Vitals:  Filed Vitals:   06/24/11 1210  BP: 115/65  Pulse: 86  Temp: 36.6 C  Resp: 16    Post vital signs: Reviewed and stable  Level of consciousness: alert  Complications: No apparent anesthesia complications

## 2011-06-25 ENCOUNTER — Encounter (HOSPITAL_BASED_OUTPATIENT_CLINIC_OR_DEPARTMENT_OTHER): Payer: Self-pay | Admitting: Specialist

## 2011-06-25 MED ORDER — SODIUM CHLORIDE 0.9 % IV SOLN
INTRAVENOUS | Status: DC | PRN
  Administered 2011-06-24: 08:00:00 via INTRAMUSCULAR

## 2011-08-07 ENCOUNTER — Other Ambulatory Visit: Payer: Self-pay | Admitting: Family Medicine

## 2011-08-26 ENCOUNTER — Other Ambulatory Visit: Payer: Self-pay | Admitting: Family Medicine

## 2011-09-19 ENCOUNTER — Other Ambulatory Visit: Payer: Self-pay | Admitting: Family Medicine

## 2011-10-30 ENCOUNTER — Other Ambulatory Visit: Payer: Self-pay | Admitting: Family Medicine

## 2012-02-05 ENCOUNTER — Other Ambulatory Visit: Payer: Self-pay | Admitting: Family Medicine

## 2012-03-10 ENCOUNTER — Encounter: Payer: Self-pay | Admitting: Physician Assistant

## 2012-03-10 ENCOUNTER — Ambulatory Visit (INDEPENDENT_AMBULATORY_CARE_PROVIDER_SITE_OTHER): Payer: Managed Care, Other (non HMO) | Admitting: Physician Assistant

## 2012-03-10 VITALS — BP 134/73 | HR 68 | Temp 98.0°F | Wt 177.0 lb

## 2012-03-10 DIAGNOSIS — F411 Generalized anxiety disorder: Secondary | ICD-10-CM

## 2012-03-10 DIAGNOSIS — J441 Chronic obstructive pulmonary disease with (acute) exacerbation: Secondary | ICD-10-CM

## 2012-03-10 DIAGNOSIS — F4323 Adjustment disorder with mixed anxiety and depressed mood: Secondary | ICD-10-CM

## 2012-03-10 MED ORDER — FLUOXETINE HCL 20 MG PO TABS: ORAL_TABLET | ORAL | Status: DC

## 2012-03-10 MED ORDER — AZITHROMYCIN 250 MG PO TABS: ORAL_TABLET | ORAL | Status: DC

## 2012-03-10 MED ORDER — FLUTICASONE PROPIONATE 50 MCG/ACT NA SUSP: 2.0000 | Freq: Every day | NASAL | Status: DC

## 2012-03-10 MED ORDER — PREDNISONE 20 MG PO TABS: ORAL_TABLET | ORAL | Status: DC

## 2012-03-10 NOTE — Patient Instructions (Signed)
Increase prozac to 60mg .   Bronchitis Bronchitis is the body's way of reacting to injury and/or infection (inflammation) of the bronchi. Bronchi are the air tubes that extend from the windpipe into the lungs. If the inflammation becomes severe, it may cause shortness of breath. CAUSES  Inflammation may be caused by:  A virus.  Germs (bacteria).  Dust.  Allergens.  Pollutants and many other irritants. The cells lining the bronchial tree are covered with tiny hairs (cilia). These constantly beat upward, away from the lungs, toward the mouth. This keeps the lungs free of pollutants. When these cells become too irritated and are unable to do their job, mucus begins to develop. This causes the characteristic cough of bronchitis. The cough clears the lungs when the cilia are unable to do their job. Without either of these protective mechanisms, the mucus would settle in the lungs. Then you would develop pneumonia. Smoking is a common cause of bronchitis and can contribute to pneumonia. Stopping this habit is the single most important thing you can do to help yourself. TREATMENT   Your caregiver may prescribe an antibiotic if the cough is caused by bacteria. Also, medicines that open up your airways make it easier to breathe. Your caregiver may also recommend or prescribe an expectorant. It will loosen the mucus to be coughed up. Only take over-the-counter or prescription medicines for pain, discomfort, or fever as directed by your caregiver.  Removing whatever causes the problem (smoking, for example) is critical to preventing the problem from getting worse.  Cough suppressants may be prescribed for relief of cough symptoms.  Inhaled medicines may be prescribed to help with symptoms now and to help prevent problems from returning.  For those with recurrent (chronic) bronchitis, there may be a need for steroid medicines. SEEK IMMEDIATE MEDICAL CARE IF:   During treatment, you develop more  pus-like mucus (purulent sputum).  You have a fever.  Your baby is older than 3 months with a rectal temperature of 102 F (38.9 C) or higher.  Your baby is 73 months old or younger with a rectal temperature of 100.4 F (38 C) or higher.  You become progressively more ill.  You have increased difficulty breathing, wheezing, or shortness of breath. It is necessary to seek immediate medical care if you are elderly or sick from any other disease. MAKE SURE YOU:   Understand these instructions.  Will watch your condition.  Will get help right away if you are not doing well or get worse. Document Released: 12/31/2004 Document Revised: 03/25/2011 Document Reviewed: 11/10/2007 [facility] Patient Information 2013 [place], [place].

## 2012-03-10 NOTE — Progress Notes (Signed)
  Subjective:    Patient ID: Sherri [name], female    DOB: [birth year], 57 y.o.   MRN: [id number]  URI  This is a new problem. The current episode started 1 to 4 weeks ago. The problem has been gradually worsening. There has been no fever. Associated symptoms include congestion, coughing, ear pain, headaches, rhinorrhea, sinus pain, a sore throat and wheezing. Pertinent negatives include no abdominal pain, chest pain, diarrhea, dysuria, joint pain, joint swelling, nausea, neck pain, plugged ear sensation, rash, sneezing, swollen glands or vomiting. Associated symptoms comments: Cough is somewhat productive.  Wheezing at night mostly.  Chest fills tight and can't get a good deep breath. . Treatments tried: tylenol flu and cold, anti-histamine,taking albuterol inhaler and symbicort daily. The treatment provided mild relief.   Having a lot more anxiety recently. Had to kick son out of house due to drugs. Mother is very sick with dementia. She has had 2 close friends die in the last 2 weeks. On prozac and has done well but feels very overwhelmed. Denies any thoughts of suicide or harm to other.    Review of Systems  HENT: Positive for ear pain, congestion, sore throat and rhinorrhea. Negative for sneezing and neck pain.   Respiratory: Positive for cough and wheezing.   Cardiovascular: Negative for chest pain.  Gastrointestinal: Negative for nausea, vomiting, abdominal pain and diarrhea.  Genitourinary: Negative for dysuria.  Musculoskeletal: Negative for joint pain.  Skin: Negative for rash.  Neurological: Positive for headaches.       Objective:   Physical Exam  Constitutional: She is oriented to person, place, and time. She appears well-developed and well-nourished.  HENT:  Head: Normocephalic and atraumatic.  Right Ear: External ear normal.  Left Ear: External ear normal.  TM's clear bilaterally. Turbinates red and swollen. Oropharynx red and PND present.   Eyes: Conjunctivae are  normal.  Neck: Normal range of motion. Neck supple.  Cardiovascular: Normal rate, regular rhythm and normal heart sounds.   Pulmonary/Chest: She has wheezes. She exhibits tenderness.  Decreased effort, bilateral wheezing on expiration  Lymphadenopathy:    She has no cervical adenopathy.  Neurological: She is alert and oriented to person, place, and time.  Skin: Skin is warm and dry.  Psychiatric: She has a normal mood and affect. Her behavior is normal.          Assessment & Plan:  COPD exacerbation- Prednisone burst, zpak and flonase. Continue with rescue inhaler and symbicort. Call if not improving.   Anxiety- Increased prozac to 60mg  after long discussion. We had thought about added buspar twice a day. Pt would like to see if increase works or if she needs more. Previously on 40mg  increased to 60mg . Follow up in 6 weeks. Call if not working can add buspar. Discussed this is a normal response to stress.

## 2012-04-07 ENCOUNTER — Telehealth: Payer: Self-pay

## 2012-04-07 NOTE — Telephone Encounter (Signed)
Yes can split the dose up. If still having diarrhea then can decrease to 20mg  a day.

## 2012-04-07 NOTE — Telephone Encounter (Signed)
Sherri [name] went to the [facility] ED for right knee pain. She was treated with Prednisone 10 mg 4 tablets once daily then 10 mg 2 tablets once daily then 1 tablet once daily. She started it 2 days ago. She now thinks the prednisone being such a high dose this is causing her to have diarrhea. She wants to know if she could take the 10 mg tablets QID instead of all at once. Please advise.

## 2012-04-08 NOTE — Telephone Encounter (Signed)
Patient notified of instructions. Barry Dienes, LPN

## 2012-06-26 ENCOUNTER — Ambulatory Visit (INDEPENDENT_AMBULATORY_CARE_PROVIDER_SITE_OTHER): Payer: Managed Care, Other (non HMO) | Admitting: Physician Assistant

## 2012-06-26 ENCOUNTER — Encounter: Payer: Self-pay | Admitting: Physician Assistant

## 2012-06-26 ENCOUNTER — Ambulatory Visit (INDEPENDENT_AMBULATORY_CARE_PROVIDER_SITE_OTHER): Payer: Managed Care, Other (non HMO)

## 2012-06-26 ENCOUNTER — Other Ambulatory Visit: Payer: Self-pay | Admitting: Physician Assistant

## 2012-06-26 VITALS — BP 137/85 | HR 67 | Wt 182.0 lb

## 2012-06-26 DIAGNOSIS — W101XXA Fall (on)(from) sidewalk curb, initial encounter: Secondary | ICD-10-CM

## 2012-06-26 DIAGNOSIS — Z9181 History of falling: Secondary | ICD-10-CM

## 2012-06-26 DIAGNOSIS — W19XXXA Unspecified fall, initial encounter: Secondary | ICD-10-CM

## 2012-06-26 DIAGNOSIS — R0781 Pleurodynia: Secondary | ICD-10-CM

## 2012-06-26 DIAGNOSIS — R079 Chest pain, unspecified: Secondary | ICD-10-CM

## 2012-06-26 DIAGNOSIS — S20219A Contusion of unspecified front wall of thorax, initial encounter: Secondary | ICD-10-CM

## 2012-06-26 DIAGNOSIS — S20212A Contusion of left front wall of thorax, initial encounter: Secondary | ICD-10-CM

## 2012-06-26 MED ORDER — CYCLOBENZAPRINE HCL 10 MG PO TABS: 10.0000 mg | ORAL_TABLET | Freq: Three times a day (TID) | ORAL | Status: DC | PRN

## 2012-06-26 MED ORDER — TRAMADOL HCL 50 MG PO TABS: 50.0000 mg | ORAL_TABLET | Freq: Four times a day (QID) | ORAL | Status: DC | PRN

## 2012-06-26 NOTE — Progress Notes (Signed)
  Subjective:    Patient ID: Sherri [name], female    DOB: 17-Jan-1955, 57 y.o.   MRN: [id number]  HPI Patient presents to the clinic with left side rib pain after a fall 2 days ago. She was carrying a bag of sugar and pocketbook under her left arm. She went to step up on a curb and lost her balance and fell with her wrist hitting her left upper quadrant into her ribs. She has been taking 400 mg of ibuprofen and has helped minimally. She has also tried Flexeril that was previously given to her for another condition. Pain is not constant but at some point can increase to 10 out of 10. She's having a hard time with deep breathing do to pain.    Review of Systems     Objective:   Physical Exam  Constitutional: She appears well-developed and well-nourished.  HENT:  Head: Normocephalic and atraumatic.  Cardiovascular: Normal rate, regular rhythm and normal heart sounds.   Pulmonary/Chest: Effort normal and breath sounds normal. She has no wheezes.  Musculoskeletal:  No bruising or swelling over left upper quadrant where and Pap occurred. There is tenderness over both of the 12th ribs.  Psychiatric: She has a normal mood and affect. Her behavior is normal.          Assessment & Plan:  Rib contusion-x-ray showed no fractures or evidence of pneumothorax. Patient was put in Ace bandage to help with compression. Patient was given handout on rib contusions and encouraged to ice regularly. Patient encouraged to continue deep breathing exercises to make sure lungs to not develop an infection. Continue using ibuprofen but increase to 800 mg 3 times a day. Tramadol for breakthrough pain as well as Flexeril to help relax muscles. Call if not improving in the next week.

## 2012-06-26 NOTE — Patient Instructions (Addendum)
Ibuprofen 800mg  up to three times a day. Tramadol for break through pain.  Rib Contusion A rib contusion (bruise) can occur by a blow to the chest or by a fall against a hard object. Usually these will be much better in a couple weeks. If X-rays were taken today and there are no broken bones (fractures), the diagnosis of bruising is made. However, broken ribs may not show up for several days, or may be discovered later on a routine X-ray when signs of healing show up. If this happens to you, it does not mean that something was missed on the X-ray, but simply that it did not show up on the first X-rays. Earlier diagnosis will not usually change the treatment. HOME CARE INSTRUCTIONS   Avoid strenuous activity. Be careful during activities and avoid bumping the injured ribs. Activities that pull on the injured ribs and cause pain should be avoided, if possible.  For the first day or two, an ice pack used every 20 minutes while awake may be helpful. Put ice in a plastic bag and put a towel between the bag and the skin.  Eat a normal, well-balanced diet. Drink plenty of fluids to avoid constipation.  Take deep breaths several times a day to keep lungs free of infection. Try to cough several times a day. Splint the injured area with a pillow while coughing to ease pain. Coughing can help prevent pneumonia.  Wear a rib belt or binder only if told to do so by your caregiver. If you are wearing a rib belt or binder, you must do the breathing exercises as directed by your caregiver. If not used properly, rib belts or binders restrict breathing which can lead to pneumonia.  Only take over-the-counter or prescription medicines for pain, discomfort, or fever as directed by your caregiver. SEEK MEDICAL CARE IF:   You or your child has an oral temperature above 102 F (38.9 C).  Your baby is older than 3 months with a rectal temperature of 100.5 F (38.1 C) or higher for more than 1 day.  You develop a  cough, with thick or bloody sputum. SEEK IMMEDIATE MEDICAL CARE IF:   You have difficulty breathing.  You feel sick to your stomach (nausea), have vomiting or belly (abdominal) pain.  You have worsening pain, not controlled with medications, or there is a change in the location of the pain.  You develop sweating or radiation of the pain into the arms, jaw or shoulders, or become light headed or faint.  You or your child has an oral temperature above 102 F (38.9 C), not controlled by medicine.  Your or your baby is older than 3 months with a rectal temperature of 102 F (38.9 C) or higher.  Your baby is 76 months old or younger with a rectal temperature of 100.4 F (38 C) or higher. MAKE SURE YOU:   Understand these instructions.  Will watch your condition.  Will get help right away if you are not doing well or get worse. Document Released: 09/25/2000 Document Revised: 03/25/2011 Document Reviewed: 08/19/2007 [facility] Patient Information 2014 [place], [place].

## 2012-08-11 ENCOUNTER — Other Ambulatory Visit: Payer: Self-pay | Admitting: Physician Assistant

## 2012-09-30 ENCOUNTER — Telehealth: Payer: Self-pay | Admitting: *Deleted

## 2012-09-30 NOTE — Telephone Encounter (Signed)
Pt wanted to know when her last Tetanus was done, information given.Sherri [name] [place]

## 2013-01-13 ENCOUNTER — Ambulatory Visit: Payer: PRIVATE HEALTH INSURANCE

## 2013-02-05 ENCOUNTER — Ambulatory Visit (INDEPENDENT_AMBULATORY_CARE_PROVIDER_SITE_OTHER): Payer: PRIVATE HEALTH INSURANCE | Admitting: Physician Assistant

## 2013-02-05 ENCOUNTER — Encounter: Payer: Self-pay | Admitting: Physician Assistant

## 2013-02-05 VITALS — BP 124/77 | HR 78 | Temp 98.0°F | Wt 182.0 lb

## 2013-02-05 DIAGNOSIS — F17200 Nicotine dependence, unspecified, uncomplicated: Secondary | ICD-10-CM

## 2013-02-05 DIAGNOSIS — J441 Chronic obstructive pulmonary disease with (acute) exacerbation: Secondary | ICD-10-CM

## 2013-02-05 MED ORDER — PREDNISONE 20 MG PO TABS: ORAL_TABLET | ORAL | Status: DC

## 2013-02-05 MED ORDER — AZITHROMYCIN 250 MG PO TABS: ORAL_TABLET | ORAL | Status: DC

## 2013-02-05 NOTE — Patient Instructions (Signed)

## 2013-02-05 NOTE — Progress Notes (Signed)
   Subjective:    Patient ID: Sherri [name], female    DOB: [birth year], 58 y.o.   MRN: [id number]  HPI Patient is a 58 year old female who presents to the clinic with cough, headache, and chest congestion. Patient is a daily smoker and does have COPD. She started using T6 about a week ago to try to stop smoking. She does not know if starting the ecigs initiated this irritation. Patient has had significant productive cough with white to yellowish sputum. Her cough continues to get worse along with wheezing and shortness of breath. Patient is using albuterol inhaler multiple times a day with last dose being before she got here this morning. Patient continues to use a Symbicort daily. She does have some sinus headache. Patient denies any fever. Patient does report some mild body aches and bilateral ear pain.    Review of Systems     Objective:   Physical Exam  Constitutional: She is oriented to person, place, and time. She appears well-developed and well-nourished.  HENT:  Head: Normocephalic and atraumatic.  Right Ear: External ear normal.  Left Ear: External ear normal.  Nose: Nose normal.  Mouth/Throat: Oropharynx is clear and moist.  TMs clear bilaterally.   Eyes: Conjunctivae are normal.  Neck: Normal range of motion. Neck supple.  Cardiovascular: Normal rate, regular rhythm and normal heart sounds.   Pulmonary/Chest:  Bilateral lungs were filled with both inspiratory and expiratory wheezing. Rhonchi were heard bilaterally but worse on the left lower base.  Lymphadenopathy:    She has no cervical adenopathy.  Neurological: She is alert and oriented to person, place, and time.  Skin: Skin is dry.  Psychiatric: She has a normal mood and affect. Her behavior is normal.          Assessment & Plan:  COPD exacerbation/tobacco dependence- treated with Z-Pak as well as prednisone burst. Continue to use albuterol nebulizer as needed. Stay on daily Symbicort. I do have a suspicion  for pneumonia however Z-Pak should cover most community-acquired pneumonia. Pt does not want  anything for cough at this time. Reassured patient that coughing it up was beneficial. Offer Tessalon Perle patient declined at this time. Continue to rest and stay hydrated. Encouraged patient to completely stop smoking. Patient has not smoked the last couple days. Call if not improving.

## 2013-03-08 ENCOUNTER — Ambulatory Visit (INDEPENDENT_AMBULATORY_CARE_PROVIDER_SITE_OTHER): Payer: PRIVATE HEALTH INSURANCE | Admitting: Physician Assistant

## 2013-03-08 ENCOUNTER — Ambulatory Visit (INDEPENDENT_AMBULATORY_CARE_PROVIDER_SITE_OTHER): Payer: PRIVATE HEALTH INSURANCE

## 2013-03-08 ENCOUNTER — Encounter: Payer: Self-pay | Admitting: Physician Assistant

## 2013-03-08 VITALS — BP 121/66 | HR 77 | Wt 189.0 lb

## 2013-03-08 DIAGNOSIS — M2242 Chondromalacia patellae, left knee: Secondary | ICD-10-CM

## 2013-03-08 DIAGNOSIS — M25562 Pain in left knee: Secondary | ICD-10-CM

## 2013-03-08 DIAGNOSIS — M25469 Effusion, unspecified knee: Secondary | ICD-10-CM

## 2013-03-08 DIAGNOSIS — M2240 Chondromalacia patellae, unspecified knee: Secondary | ICD-10-CM

## 2013-03-08 DIAGNOSIS — M25569 Pain in unspecified knee: Secondary | ICD-10-CM

## 2013-03-08 MED ORDER — NAPROXEN-ESOMEPRAZOLE 375-20 MG PO TBEC: DELAYED_RELEASE_TABLET | ORAL | Status: DC

## 2013-03-08 MED ORDER — HYDROCODONE-ACETAMINOPHEN 7.5-325 MG PO TABS: 1.0000 | ORAL_TABLET | Freq: Three times a day (TID) | ORAL | Status: DC | PRN

## 2013-03-08 NOTE — Patient Instructions (Signed)
Meniscus Tear with Phase I Rehab The meniscus is a C-shaped cartilage structure, located in the knee joint between the thigh bone (femur) and the shinbone (tibia). Two menisci are located in each knee joint: the inner and outer meniscus. The meniscus acts as an adapter between the thigh bone and shinbone, allowing them to fit properly together. It also functions as a shock absorber, to reduce the stress placed on the knee joint and to help supply nutrients to the knee joint cartilage. As people age, the meniscus begins to harden and become more vulnerable to injury. Meniscus tears are a common injury, especially in older athletes. Inner meniscus tears are more common than outer meniscus tears.  SYMPTOMS   Pain in the knee, especially with standing or squatting with the affected leg.  Tenderness along the joint line.  Swelling in the knee joint (effusion), usually starting 1 to 2 days after injury.  Locking or catching of the knee joint, causing inability to straighten the knee completely.  Giving way or buckling of the knee. CAUSES  A meniscus tear occurs when a force is placed on the meniscus that is greater than it can handle. Common causes of injury include:  Direct hit (trauma) to the knee.  Twisting, pivoting, or cutting (rapidly changing direction while running), kneeling or squatting.  Without injury, due to aging. RISK INCREASES WITH:  Contact sports (football, rugby).  Sports in which cleats are used with pivoting (soccer, lacrosse) or sports in which good shoe grip and sudden change in direction are required (racquetball, basketball, squash).  Previous knee injury.  Associated knee injury, particularly ligament injuries.  Poor strength and flexibility. PREVENTION  Warm up and stretch properly before activity.  Maintain physical fitness:  Strength, flexibility, and endurance.  Cardiovascular fitness.  Protect the knee with a brace or elastic bandage.  Wear  properly fitted protective equipment (proper cleats for the surface). PROGNOSIS  Sometimes, meniscus tears heal on their own. However, definitive treatment requires surgery, followed by at least 6 weeks of recovery.  RELATED COMPLICATIONS   Recurring symptoms that result in a chronic problem.  Repeated knee injury, especially if sports are resumed too soon after injury or surgery.  Progression of the tear (the tear gets larger), if untreated.  Arthritis of the knee in later years (with or without surgery).  Complications of surgery, including infection, bleeding, injury to nerves (numbness, weakness, paralysis) continued pain, giving way, locking, nonhealing of meniscus (if repaired), need for further surgery, and knee stiffness (loss of motion). TREATMENT  Treatment first involves the use of ice and medicine, to reduce pain and inflammation. You may find using crutches to walk more comfortable. However, it is okay to bear weight on the injured knee, if the pain will allow it. Surgery is often advised as a definitive treatment. Surgery is performed through an incision near the joint (arthroscopically). The torn piece of the meniscus is removed, and if possible the joint cartilage is repaired. After surgery, the joint must be restrained. After restraint, it is important to perform strengthening and stretching exercises to help regain strength and a full range of motion. These exercises may be completed at home or with a therapist.  MEDICATION  If pain medicine is needed, nonsteroidal anti-inflammatory medicines (aspirin and ibuprofen), or other minor pain relievers (acetaminophen), are often advised.  Do not take pain medicine for 7 days before surgery.  Prescription pain relievers may be given, if your caregiver thinks they are needed. Use only as directed and   only as much as you need. HEAT AND COLD  Cold treatment (icing) should be applied for 10 to 15 minutes every 2 to 3 hours for  inflammation and pain, and immediately after activity that aggravates your symptoms. Use ice packs or an ice massage.  Heat treatment may be used before performing stretching and strengthening activities prescribed by your caregiver, physical therapist, or athletic trainer. Use a heat pack or a warm water soak. SEEK MEDICAL CARE IF:   Symptoms get worse or do not improve in 2 weeks, despite treatment.  New, unexplained symptoms develop. (Drugs used in treatment may produce side effects.) EXERCISES RANGE OF MOTION (ROM) AND STRETCHING EXERCISES - Meniscus Tear, Non-operative, Phase I These are some of the initial exercises with which you may start your rehabilitation program, until you see your caregiver again or until your symptoms are resolved. Remember:   These initial exercises are intended to be gentle. They will help you restore motion without increasing any swelling.  Completing these exercises allows less painful movement and prepares you for the more aggressive strengthening exercises in Phase II.  An effective stretch should be held for at least 30 seconds.  A stretch should never be painful. You should only feel a gentle lengthening or release in the stretched tissue. RANGE OF MOTION - Knee Flexion, Active  Lie on your back with both knees straight. (If this causes back discomfort, bend your healthy knee, placing your foot flat on the floor.)  Slowly slide your heel back toward your buttocks until you feel a gentle stretch in the front of your knee or thigh.  Hold for __________ seconds. Slowly slide your heel back to the starting position. Repeat __________ times. Complete this exercise __________ times per day.  RANGE OF MOTION - Knee Flexion and Extension, Active-Assisted  Sit on the edge of a table or chair with your thighs firmly supported. It may be helpful to place a folded towel under the end of your right / left thigh.  Flexion (bending): Place the ankle of your  healthy leg on top of the other ankle. Use your healthy leg to gently bend your right / left knee until you feel a mild tension across the top of your knee.  Hold for __________ seconds.  Extension (straightening): Switch your ankles so your right / left leg is on top. Use your healthy leg to straighten your right / left knee until you feel a mild tension on the backside of your knee.  Hold for __________ seconds. Repeat __________ times. Complete __________ times per day. STRETCH - Knee Flexion, Supine  Lie on the floor with your right / left heel and foot lightly touching the wall. (Place both feet on the wall if you do not use a door frame.)  Without using any effort, allow gravity to slide your foot down the wall slowly until you feel a gentle stretch in the front of your right / left knee.  Hold this stretch for __________ seconds. Then return the leg to the starting position, using your healthy leg for help, if needed. Repeat __________ times. Complete this stretch __________ times per day.  STRETCH - Knee Extension Sitting  Sit with your right / left leg/heel propped on another chair, coffee table, or foot stool.  Allow your leg muscles to relax, letting gravity straighten out your knee.*  You should feel a stretch behind your right / left knee. Hold this position for __________ seconds. Repeat __________ times. Complete this stretch __________   times per day.  *Your physician, physical therapist or athletic trainer may instruct you place a __________ weight on your thigh, just above your kneecap, to deepen the stretch.  STRENGTHENING EXERCISES - Meniscus Tear, Non-operative, Phase I These exercises may help you when beginning to rehabilitate your injury. They may resolve your symptoms with or without further involvement from your physician, physical therapist or athletic trainer. While completing these exercises, remember:   Muscles can gain both the endurance and the strength  needed for everyday activities through controlled exercises.  Complete these exercises as instructed by your physician, physical therapist or athletic trainer. Progress the resistance and repetitions only as guided. STRENGTH - Quadriceps, Isometrics  Lie on your back with your right / left leg extended and your opposite knee bent.  Gradually tense the muscles in the front of your right / left thigh. You should see either your knee cap slide up toward your hip or increased dimpling just above the knee. This motion will push the back of the knee down toward the floor, mat, or bed on which you are lying.  Hold the muscle as tight as you can, without increasing your pain, for __________ seconds.  Relax the muscles slowly and completely between each repetition. Repeat __________ times. Complete this exercise __________ times per day.  STRENGTH - Quadriceps, Short Arcs   Lie on your back. Place a __________ inch towel roll under your right / left knee, so that the knee bends slightly.  Raise only your lower leg by tightening the muscles in the front of your thigh. Do not allow your thigh to rise.  Hold this position for __________ seconds. Repeat __________ times. Complete this exercise __________ times per day.  OPTIONAL ANKLE WEIGHTS: Begin with ____________________, but DO NOT exceed ____________________. Increase in 1 pound/0.5 kilogram increments. STRENGTH - Quadriceps, Straight Leg Raises  Quality counts! Watch for signs that the quadriceps muscle is working, to be sure you are strengthening the correct muscles and not "cheating" by substituting with healthier muscles.  Lay on your back with your right / left leg extended and your opposite knee bent.  Tense the muscles in the front of your right / left thigh. You should see either your knee cap slide up or increased dimpling just above the knee. Your thigh may even shake a bit.  Tighten these muscles even more and raise your leg 4 to 6  inches off the floor. Hold for __________ seconds.  Keeping these muscles tense, lower your leg.  Relax the muscles slowly and completely in between each repetition. Repeat __________ times. Complete this exercise __________ times per day.  STRENGTH - Hamstring, Curls   Lay on your stomach with your legs extended. (If you lay on a bed, your feet may hang over the edge.)  Tighten the muscles in the back of your thigh to bend your right / left knee up to 90 degrees. Keep your hips flat on the bed.  Hold this position for __________ seconds.  Slowly lower your leg back to the starting position. Repeat __________ times. Complete this exercise __________ times per day.  STRENGTH  Quadriceps, Squats  Stand in a door frame so that your feet and knees are in line with the frame.  Use your hands for balance, not support, on the frame.  Slowly lower your weight, bending at the hips and knees. Keep your lower legs upright so that they are parallel with the door frame. Squat only within the range that does   not increase your knee pain. Never let your hips drop below your knees.  Slowly return upright, pushing with your legs, not pulling with your hands. Repeat __________ times. Complete this exercise __________ times per day.  STRENGTH - Quad/VMO, Isometric   Sit in a chair with your right / left knee slightly bent. With your fingertips, feel the VMO muscle just above the inside of your knee. The VMO is important in controlling the position of your kneecap.  Keeping your fingertips on this muscle. Without actually moving your leg, attempt to drive your knee down as if straightening your leg. You should feel your VMO tense. If you have a difficult time, you may wish to try the same exercise on your healthy knee first.  Tense this muscle as hard as you can without increasing any knee pain.  Hold for __________ seconds. Relax the muscles slowly and completely in between each repetition. Repeat  __________ times. Complete exercise __________ times per day.  Document Released: 01/14/1998 Document Revised: 03/25/2011 Document Reviewed: 04/14/2008 [employer] Patient Information 2014 [employer].    

## 2013-03-08 NOTE — Progress Notes (Signed)
   Subjective:    Patient ID: Sherri [name], female    DOB: 1955/02/06, 58 y.o.   MRN: [id number]  HPI Pt is a 58 yo female who presents to the clinic with left knee pain that started last week after she fell in the ice. She landed right on her knee cap but more to the lateral side. She did feel her knee externally rotate. She continues to be in 9/10 pain by the end of the day and with walking. She has tried ice and tylenol but noting else. Treatment has not really helped.     Review of Systems     Objective:   Physical Exam  Constitutional: She is oriented to person, place, and time. She appears well-developed and well-nourished.  HENT:  Head: Normocephalic and atraumatic.  Cardiovascular: Normal rate, regular rhythm and normal heart sounds.   Pulmonary/Chest: Effort normal and breath sounds normal.  Musculoskeletal:  ROM of left knee limited due to pain. Small joint effusion with positive fluid wave.small hematoma and the inferior knee below patella. Not able to fully extend able to get to 145 degrees. Pain but not popping with McMurrays. Strength 4/5 with left leg. Pain over medial and lateral joint space with palpation. Pain over patella.   Neurological: She is alert and oriented to person, place, and time.  Skin: Skin is dry.  Psychiatric: She has a normal mood and affect. Her behavior is normal.          Assessment & Plan:  Left knee pain- suspect medial meniscal tear. Pt has problems with NSAIDs and stomach upset. Ordered Vimovo to try BID. Injection was administered today. Discussed rest, ice, elevation. I did give some knee exercises that could be done as tolerated. Norco was given for acute pain control. Will get xrays. If not improving or worsening follow up in 2-4 weeks.   Knee Injection Procedure Note  Pre-operative Diagnosis: left knee pain  Post-operative Diagnosis: same  Indications: pain  Anesthesia: ethyl chloride   Procedure Details   Verbal consent  was obtained for the procedure. The joint was prepped with Betadine and a small wheel of anesthetic was injected into the subcutaneous tissue. A 22 gauge needle was inserted into the medial aspect of the joint. 9 ml 1% lidocaine and 1 ml of depo medrol 40mg  was then injected into the joint through the same needle. The needle was removed and the area cleansed and dressed.  Complications:  None; patient tolerated the procedure well.

## 2013-03-09 DIAGNOSIS — M2242 Chondromalacia patellae, left knee: Secondary | ICD-10-CM | POA: Insufficient documentation

## 2013-03-11 ENCOUNTER — Institutional Professional Consult (permissible substitution): Payer: PRIVATE HEALTH INSURANCE | Admitting: Sports Medicine

## 2013-03-12 ENCOUNTER — Encounter: Payer: Self-pay | Admitting: Sports Medicine

## 2013-03-12 ENCOUNTER — Ambulatory Visit (INDEPENDENT_AMBULATORY_CARE_PROVIDER_SITE_OTHER): Payer: PRIVATE HEALTH INSURANCE | Admitting: Sports Medicine

## 2013-03-12 VITALS — BP 135/79 | HR 64 | Ht 65.0 in | Wt 195.0 lb

## 2013-03-12 DIAGNOSIS — M2242 Chondromalacia patellae, left knee: Secondary | ICD-10-CM

## 2013-03-12 DIAGNOSIS — M2240 Chondromalacia patellae, unspecified knee: Secondary | ICD-10-CM

## 2013-03-12 MED ORDER — TRAMADOL HCL 50 MG PO TABS: ORAL_TABLET | ORAL | Status: DC

## 2013-03-12 NOTE — Progress Notes (Signed)
   Subjective:    I'm seeing this patient as a consultation for:  Iran Planas, PA-C  CC: Left knee pain  HPI: This is a very pleasant 58 year old female, sometime ago she slipped on the ice, and fell directly on the anterolateral aspect of her left knee. She had immediate pain, swelling, she was seen by Luvenia Starch, and her knee was injected with Depo-Medrol 4 days ago. She did get good temporary relief after the injection however her pain continues. Moderate, persistent, worse with going up and down stairs, and localized underneath the patella.  Past medical history, Surgical history, Family history not pertinant except as noted below, Social history, Allergies, and medications have been entered into the medical record, reviewed, and no changes needed.   Review of Systems: No headache, visual changes, nausea, vomiting, diarrhea, constipation, dizziness, abdominal pain, skin rash, fevers, chills, night sweats, weight loss, swollen lymph nodes, body aches, joint swelling, muscle aches, chest pain, shortness of breath, mood changes, visual or auditory hallucinations.   Objective:   General: Well Developed, well nourished, and in no acute distress.  Neuro/Psych: Alert and oriented x3, extra-ocular muscles intact, able to move all 4 extremities, sensation grossly intact. Skin: Warm and dry, no rashes noted.  Respiratory: Not using accessory muscles, speaking in full sentences, trachea midline.  Cardiovascular: Pulses palpable, no extremity edema. Abdomen: Does not appear distended. Left Knee: Palpation of the lateral joint line, as well as the patellar tendon. Mild effusion without a fluid wave. ROM full in flexion and extension and lower leg rotation. Ligaments with solid consistent endpoints including ACL, PCL, LCL, MCL. Negative Mcmurray's, Apley's, and Thessalonian tests. Positive patellar glide crepitus. Patellar and quadriceps tendons unremarkable. Hamstring and quadriceps strength is  normal.   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  Impression and Recommendations:   This case required medical decision making of moderate complexity.

## 2013-03-12 NOTE — Assessment & Plan Note (Signed)
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 Sherri [name] injected her knee approximately 4 days ago, onset of action of Depo-Medrol is 4-7 days so I do suspect she will be getting some relief of the next several days. Knee brace, naproxen/esomeprazole, on rehabilitation exercises. Return to see me in one month if no better.

## 2013-04-09 ENCOUNTER — Ambulatory Visit: Payer: PRIVATE HEALTH INSURANCE | Admitting: Sports Medicine

## 2013-04-09 DIAGNOSIS — Z0289 Encounter for other administrative examinations: Secondary | ICD-10-CM

## 2013-04-21 ENCOUNTER — Ambulatory Visit (INDEPENDENT_AMBULATORY_CARE_PROVIDER_SITE_OTHER): Payer: PRIVATE HEALTH INSURANCE | Admitting: Physician Assistant

## 2013-04-21 ENCOUNTER — Encounter: Payer: Self-pay | Admitting: Physician Assistant

## 2013-04-21 VITALS — BP 135/79 | HR 68 | Ht 65.0 in | Wt 185.0 lb

## 2013-04-21 DIAGNOSIS — J309 Allergic rhinitis, unspecified: Secondary | ICD-10-CM

## 2013-04-21 DIAGNOSIS — J302 Other seasonal allergic rhinitis: Secondary | ICD-10-CM

## 2013-04-21 DIAGNOSIS — H6980 Other specified disorders of Eustachian tube, unspecified ear: Secondary | ICD-10-CM

## 2013-04-21 MED ORDER — BECLOMETHASONE DIPROPIONATE 80 MCG/ACT NA AERS: INHALATION_SPRAY | NASAL | Status: DC

## 2013-04-21 MED ORDER — METHYLPREDNISOLONE (PAK) 4 MG PO TABS: ORAL_TABLET | ORAL | Status: DC

## 2013-04-21 MED ORDER — METHYLPREDNISOLONE SODIUM SUCC 125 MG IJ SOLR
125.0000 mg | Freq: Once | INTRAMUSCULAR | Status: AC
  Administered 2013-04-21: 125 mg via INTRAMUSCULAR

## 2013-04-21 NOTE — Patient Instructions (Signed)
If not improving consider medrol dose pack.

## 2013-04-21 NOTE — Progress Notes (Signed)
   Subjective:    Patient ID: Sherri [name], female    DOB: 1955-02-19, 58 y.o.   MRN: [id number]  HPI Pt is a 58 yo female who presents to the clinic with bilateral ear pain that started yesterday morning. She has flonase but does not seem to be working as well. Denies any fever, chills, n/v/d. She does have some maxillary sinus pressure and ST. She is taking zyrtec daily. No cough, wheezing or SOB. She denies any ear discharge or hearing loss.    Review of Systems     Objective:   Physical Exam  Constitutional: She appears well-developed and well-nourished.  HENT:  Head: Normocephalic and atraumatic.  TM's appear a little dull around perimeter but ossicles able to be viewed. Erythema in external canal bilaterally.   PND oropharynx.   Turbinates red and swollen bilaterally with rhinorrhea.   Right sided maxillary tenderness to palpation.   Eyes:  Bilateral injected conjunctiva with clear discharge.   Neck: Normal range of motion. Neck supple.  Cardiovascular: Normal rate, regular rhythm and normal heart sounds.   Pulmonary/Chest: Effort normal and breath sounds normal. She has no wheezes.  Lymphadenopathy:    She has no cervical adenopathy.  Skin: Skin is dry.  Psychiatric: She has a normal mood and affect. Her behavior is normal.          Assessment & Plan:  Eustachian tube dysfunction, bilaterally/seasonal allergies- pt did not want oral prednisone after I had already sent to pharmacy. We instead decided to give solumedrol 125mg  IM now. If not improving can consider oral prednisone. Gave qnasl for pt to try instead of flonase. Same instructions of 2 sprays each nostril daily. Continue on zyrtec. If not improving or allergy symptoms worsening can consider adding singulair.

## 2013-07-10 ENCOUNTER — Other Ambulatory Visit: Payer: Self-pay | Admitting: Family Medicine

## 2013-12-13 ENCOUNTER — Ambulatory Visit (INDEPENDENT_AMBULATORY_CARE_PROVIDER_SITE_OTHER): Payer: PRIVATE HEALTH INSURANCE | Admitting: Physician Assistant

## 2013-12-13 ENCOUNTER — Encounter: Payer: Self-pay | Admitting: Physician Assistant

## 2013-12-13 ENCOUNTER — Other Ambulatory Visit: Payer: Self-pay | Admitting: Physician Assistant

## 2013-12-13 VITALS — BP 140/92 | HR 81 | Temp 98.2°F | Ht 65.0 in | Wt 182.0 lb

## 2013-12-13 DIAGNOSIS — J441 Chronic obstructive pulmonary disease with (acute) exacerbation: Secondary | ICD-10-CM

## 2013-12-13 DIAGNOSIS — J208 Acute bronchitis due to other specified organisms: Secondary | ICD-10-CM

## 2013-12-13 MED ORDER — AZITHROMYCIN 250 MG PO TABS: ORAL_TABLET | ORAL | Status: DC

## 2013-12-13 MED ORDER — IPRATROPIUM-ALBUTEROL 0.5-2.5 (3) MG/3ML IN SOLN
3.0000 mL | Freq: Once | RESPIRATORY_TRACT | Status: AC
  Administered 2013-12-13: 3 mL via RESPIRATORY_TRACT

## 2013-12-13 MED ORDER — ALBUTEROL SULFATE (2.5 MG/3ML) 0.083% IN NEBU: 2.5000 mg | INHALATION_SOLUTION | RESPIRATORY_TRACT | Status: DC | PRN

## 2013-12-13 MED ORDER — PREDNISONE 20 MG PO TABS: ORAL_TABLET | ORAL | Status: DC

## 2013-12-13 MED ORDER — METHYLPREDNISOLONE SODIUM SUCC 125 MG IJ SOLR
125.0000 mg | Freq: Once | INTRAMUSCULAR | Status: AC
  Administered 2013-12-13: 125 mg via INTRAMUSCULAR

## 2013-12-13 NOTE — Progress Notes (Signed)
   Subjective:    Patient ID: Sherri [name], female    DOB: 04-19-1955, 58 y.o.   MRN: [id number]  HPI Pt presents to the clinic with 5 days of SOB, productive cough, wheezing. Hx of COPD and current every day smoker. No fever or chills. Using albuterol inhaler 4-5 times a day. No nausea or vomiting. Symptoms started with ST and headache and have progressevely gotten worse. Does have some bilateral ear pressure.    Review of Systems  All other systems reviewed and are negative.      Objective:   Physical Exam  Constitutional: She is oriented to person, place, and time. She appears well-developed and well-nourished.  HENT:  Head: Normocephalic and atraumatic.  Right Ear: External ear normal.  Left Ear: External ear normal.  Nose: Nose normal.  Mouth/Throat: Oropharynx is clear and moist.  Eyes: Conjunctivae are normal.  Neck: Normal range of motion. Neck supple.  Cardiovascular: Normal rate, regular rhythm and normal heart sounds.   Pulmonary/Chest:  Expiratory wheezing and rhonchi bilateral lungs.  Pulse ox 94 percent.   Lymphadenopathy:    She has no cervical adenopathy.  Neurological: She is alert and oriented to person, place, and time.  Psychiatric: She has a normal mood and affect. Her behavior is normal.          Assessment & Plan:  Acute bronchitis/COPD exacerbation- duoneb given in office today with improvement. Sent nebulizer to use instead of albuterol inhaler every 4 hours as needed for wheezing and SOB. Solumedrol 125mg  given in office today. zpak and prednisone taper given. Follow up as needed or if not improving. Smoking cessation encouraged. Continue on maintence inhalers.

## 2014-04-01 ENCOUNTER — Ambulatory Visit (INDEPENDENT_AMBULATORY_CARE_PROVIDER_SITE_OTHER): Payer: PRIVATE HEALTH INSURANCE | Admitting: Family Medicine

## 2014-04-01 ENCOUNTER — Encounter: Payer: Self-pay | Admitting: Family Medicine

## 2014-04-01 VITALS — BP 141/91 | HR 82 | Wt 183.0 lb

## 2014-04-01 DIAGNOSIS — J0140 Acute pansinusitis, unspecified: Secondary | ICD-10-CM

## 2014-04-01 DIAGNOSIS — J441 Chronic obstructive pulmonary disease with (acute) exacerbation: Secondary | ICD-10-CM

## 2014-04-01 MED ORDER — ALBUTEROL SULFATE (2.5 MG/3ML) 0.083% IN NEBU
2.5000 mg | INHALATION_SOLUTION | Freq: Once | RESPIRATORY_TRACT | Status: AC
  Administered 2014-04-01: 2.5 mg via RESPIRATORY_TRACT

## 2014-04-01 MED ORDER — METHYLPREDNISOLONE ACETATE 80 MG/ML IJ SUSP
80.0000 mg | Freq: Once | INTRAMUSCULAR | Status: AC
  Administered 2014-04-01: 80 mg via INTRAMUSCULAR

## 2014-04-01 MED ORDER — METHYLPREDNISOLONE SODIUM SUCC 125 MG IJ SOLR: 125.0000 mg | Freq: Once | INTRAMUSCULAR | Status: DC

## 2014-04-01 MED ORDER — AZITHROMYCIN 250 MG PO TABS: ORAL_TABLET | ORAL | Status: DC

## 2014-04-01 MED ORDER — METHYLPREDNISOLONE SODIUM SUCC 125 MG IJ SOLR
125.0000 mg | Freq: Once | INTRAMUSCULAR | Status: AC
  Administered 2014-04-01: 125 mg via INTRAMUSCULAR

## 2014-04-01 NOTE — Progress Notes (Signed)
   Subjective:    Patient ID: Sherri [name], female    DOB: [birth year], 59 y.o.   MRN: [id number]  HPI 59 yo smoker with hx of COPD.  Started with allergy sxs.  Says started using flonase and zyrtec and that seemed to be helping.  Then felt like on Sunday, 6 days ago, she started feeling worse. Started to get more ear pressure, developed a cough and now getting dark yellow nasal mucous and getting grown sputum from her chest.  Says her face is extremely sore and tender. No fever, chills. She has been SOB and wheezing esp at night. She did use her nebulizer machine last night and this morning. Last time was about 5 hours ago.   Review of Systems     Objective:   Physical Exam  Constitutional: She is oriented to person, place, and time. She appears well-developed and well-nourished.  HENT:  Head: Normocephalic and atraumatic.  Right Ear: External ear normal.  Left Ear: External ear normal.  Nose: Nose normal.  Mouth/Throat: Oropharynx is clear and moist.  TMs and canals are clear. Very tender over her maxillary sinuses.   Eyes: Conjunctivae and EOM are normal. Pupils are equal, round, and reactive to light.  Neck: Neck supple. No thyromegaly present.  Cardiovascular: Normal rate, regular rhythm and normal heart sounds.   Pulmonary/Chest: Effort normal. She has wheezes.  Diffuse expiratory wheezing on exam today.  Lymphadenopathy:    She has no cervical adenopathy.  Neurological: She is alert and oriented to person, place, and time.  Skin: Skin is warm and dry.  Psychiatric: She has a normal mood and affect.          Assessment & Plan:  Acute sinusitis/bronchitis-will treat with azithromycin since she's penicillin allergic. Okay to continue symptomatic care.  COPD exacerbation-we'll treat with azithromycin as well as Solu-Medrol 125 mg IM and Depo-Medrol 80 mg. She says she tends to get very emotional when she takes oral prednisone and will prefer to do an injection. Follow-up  in 2 weeks. Call if not improving, suddenly gets worse, or develops increased shortness of breath. Nebulizer treatment given here in the office. Encouraged her to use her albuterol every 4 hours over the next couple days and as she feels better she can space it every 6 hours.

## 2014-04-01 NOTE — Patient Instructions (Signed)
Sinusitis Sinusitis is redness, soreness, and inflammation of the paranasal sinuses. Paranasal sinuses are air pockets within the bones of your face (beneath the eyes, the middle of the forehead, or above the eyes). In healthy paranasal sinuses, mucus is able to drain out, and air is able to circulate through them by way of your nose. However, when your paranasal sinuses are inflamed, mucus and air can become trapped. This can allow bacteria and other germs to grow and cause infection. Sinusitis can develop quickly and last only a short time (acute) or continue over a long period (chronic). Sinusitis that lasts for more than 12 weeks is considered chronic.  CAUSES  Causes of sinusitis include:  Allergies.  Structural abnormalities, such as displacement of the cartilage that separates your nostrils (deviated septum), which can decrease the air flow through your nose and sinuses and affect sinus drainage.  Functional abnormalities, such as when the small hairs (cilia) that line your sinuses and help remove mucus do not work properly or are not present. SIGNS AND SYMPTOMS  Symptoms of acute and chronic sinusitis are the same. The primary symptoms are pain and pressure around the affected sinuses. Other symptoms include:  Upper toothache.  Earache.  Headache.  Bad breath.  Decreased sense of smell and taste.  A cough, which worsens when you are lying flat.  Fatigue.  Fever.  Thick drainage from your nose, which often is green and may contain pus (purulent).  Swelling and warmth over the affected sinuses. DIAGNOSIS  Your health care provider will perform a physical exam. During the exam, your health care provider may:  Look in your nose for signs of abnormal growths in your nostrils (nasal polyps).  Tap over the affected sinus to check for signs of infection.  View the inside of your sinuses (endoscopy) using an imaging device that has a light attached (endoscope). If your health  care provider suspects that you have chronic sinusitis, one or more of the following tests may be recommended:  Allergy tests.  Nasal culture. A sample of mucus is taken from your nose, sent to a lab, and screened for bacteria.  Nasal cytology. A sample of mucus is taken from your nose and examined by your health care provider to determine if your sinusitis is related to an allergy. TREATMENT  Most cases of acute sinusitis are related to a viral infection and will resolve on their own within 10 days. Sometimes medicines are prescribed to help relieve symptoms (pain medicine, decongestants, nasal steroid sprays, or saline sprays).  However, for sinusitis related to a bacterial infection, your health care provider will prescribe antibiotic medicines. These are medicines that will help kill the bacteria causing the infection.  Rarely, sinusitis is caused by a fungal infection. In theses cases, your health care provider will prescribe antifungal medicine. For some cases of chronic sinusitis, surgery is needed. Generally, these are cases in which sinusitis recurs more than 3 times per year, despite other treatments. HOME CARE INSTRUCTIONS   Drink plenty of water. Water helps thin the mucus so your sinuses can drain more easily.  Use a humidifier.  Inhale steam 3 to 4 times a day (for example, sit in the bathroom with the shower running).  Apply a warm, moist washcloth to your face 3 to 4 times a day, or as directed by your health care provider.  Use saline nasal sprays to help moisten and clean your sinuses.  Take medicines only as directed by your health care provider.    If you were prescribed either an antibiotic or antifungal medicine, finish it all even if you start to feel better. SEEK IMMEDIATE MEDICAL CARE IF:  You have increasing pain or severe headaches.  You have nausea, vomiting, or drowsiness.  You have swelling around your face.  You have vision problems.  You have a stiff  neck.  You have difficulty breathing. MAKE SURE YOU:   Understand these instructions.  Will watch your condition.  Will get help right away if you are not doing well or get worse. Document Released: 12/31/2004 Document Revised: 05/17/2013 Document Reviewed: 01/15/2011 [facility] Patient Information 2015 [place], [place]. This information is not intended to replace advice given to you by your health care provider. Make sure you discuss any questions you have with your health care provider. Acute Bronchitis Bronchitis is inflammation of the airways that extend from the windpipe into the lungs (bronchi). The inflammation often causes mucus to develop. This leads to a cough, which is the most common symptom of bronchitis.  In acute bronchitis, the condition usually develops suddenly and goes away over time, usually in a couple weeks. Smoking, allergies, and asthma can make bronchitis worse. Repeated episodes of bronchitis may cause further lung problems.  CAUSES Acute bronchitis is most often caused by the same virus that causes a cold. The virus can spread from person to person (contagious) through coughing, sneezing, and touching contaminated objects. SIGNS AND SYMPTOMS   Cough.   Fever.   Coughing up mucus.   Body aches.   Chest congestion.   Chills.   Shortness of breath.   Sore throat.  DIAGNOSIS  Acute bronchitis is usually diagnosed through a physical exam. Your health care provider will also ask you questions about your medical history. Tests, such as chest X-rays, are sometimes done to rule out other conditions.  TREATMENT  Acute bronchitis usually goes away in a couple weeks. Oftentimes, no medical treatment is necessary. Medicines are sometimes given for relief of fever or cough. Antibiotic medicines are usually not needed but may be prescribed in certain situations. In some cases, an inhaler may be recommended to help reduce shortness of breath and control the cough.  A cool mist vaporizer may also be used to help thin bronchial secretions and make it easier to clear the chest.  HOME CARE INSTRUCTIONS  Get plenty of rest.   Drink enough fluids to keep your urine clear or pale yellow (unless you have a medical condition that requires fluid restriction). Increasing fluids may help thin your respiratory secretions (sputum) and reduce chest congestion, and it will prevent dehydration.   Take medicines only as directed by your health care provider.  If you were prescribed an antibiotic medicine, finish it all even if you start to feel better.  Avoid smoking and secondhand smoke. Exposure to cigarette smoke or irritating chemicals will make bronchitis worse. If you are a smoker, consider using nicotine gum or skin patches to help control withdrawal symptoms. Quitting smoking will help your lungs heal faster.   Reduce the chances of another bout of acute bronchitis by washing your hands frequently, avoiding people with cold symptoms, and trying not to touch your hands to your mouth, nose, or eyes.   Keep all follow-up visits as directed by your health care provider.  SEEK MEDICAL CARE IF: Your symptoms do not improve after 1 week of treatment.  SEEK IMMEDIATE MEDICAL CARE IF:  You develop an increased fever or chills.   You have chest pain.  You have severe shortness of breath.  You have bloody sputum.   You develop dehydration.  You faint or repeatedly feel like you are going to pass out.  You develop repeated vomiting.  You develop a severe headache. MAKE SURE YOU:   Understand these instructions.  Will watch your condition.  Will get help right away if you are not doing well or get worse. Document Released: 02/08/2004 Document Revised: 05/17/2013 Document Reviewed: 06/23/2012 [facility] Patient Information 2015 [place], [place]. This information is not intended to replace advice given to you by your health care provider. Make sure you  discuss any questions you have with your health care provider.

## 2014-04-20 ENCOUNTER — Ambulatory Visit: Payer: PRIVATE HEALTH INSURANCE | Admitting: Family Medicine

## 2015-01-20 ENCOUNTER — Other Ambulatory Visit: Payer: Self-pay | Admitting: Family Medicine

## 2015-01-20 DIAGNOSIS — Z1231 Encounter for screening mammogram for malignant neoplasm of breast: Secondary | ICD-10-CM

## 2015-01-25 ENCOUNTER — Ambulatory Visit (INDEPENDENT_AMBULATORY_CARE_PROVIDER_SITE_OTHER): Payer: PRIVATE HEALTH INSURANCE

## 2015-01-25 DIAGNOSIS — Z1231 Encounter for screening mammogram for malignant neoplasm of breast: Secondary | ICD-10-CM | POA: Diagnosis not present

## 2015-06-05 ENCOUNTER — Telehealth: Payer: Self-pay | Admitting: Family Medicine

## 2015-06-05 NOTE — Telephone Encounter (Signed)
Try to stop the oxycodone if she can. It can cause itching and she may actually be allergic. She can take Benadryl 1 or 2 tabs every 6-8 hours as needed to help with the itching but it may make her sleepy.

## 2015-06-05 NOTE — Telephone Encounter (Signed)
Nausea and fever on Saturday am, by Saturday PM she noticed redness on her chest. Pt went to ED on Sunday and it was determined she has cellulitis on her chest. Pt states the ED gave her Clindamycin and Oxycodone but she is itching really bad. Questions if there is something she can take for that. Pt does have hospital f/u with Iran Planas, PA-C at the end of the week.

## 2015-06-06 NOTE — Telephone Encounter (Signed)
Pt returned clinic call. Advised of PCP recommendation. Pt reports she started taking benadryl last night and it has helped some. Pt will try to cut back on the Oxycodone and see if that is what's causing her rash. Pt does report she is in a lot of pain so has been relying on the pain medication since discharge from ED. No further questions/concerns.

## 2015-06-06 NOTE — Telephone Encounter (Signed)
Left VM for Pt to return clinic call regarding PCP recommendation.  

## 2015-06-09 ENCOUNTER — Encounter: Payer: Self-pay | Admitting: Physician Assistant

## 2015-06-09 ENCOUNTER — Ambulatory Visit (INDEPENDENT_AMBULATORY_CARE_PROVIDER_SITE_OTHER): Payer: PRIVATE HEALTH INSURANCE | Admitting: Physician Assistant

## 2015-06-09 VITALS — BP 149/80 | HR 71 | Ht 65.0 in | Wt 190.0 lb

## 2015-06-09 DIAGNOSIS — L259 Unspecified contact dermatitis, unspecified cause: Secondary | ICD-10-CM

## 2015-06-09 DIAGNOSIS — L299 Pruritus, unspecified: Secondary | ICD-10-CM

## 2015-06-09 DIAGNOSIS — N61 Mastitis without abscess: Secondary | ICD-10-CM

## 2015-06-09 LAB — BASIC METABOLIC PANEL
BUN: 7 mg/dL (ref 7–25)
CO2: 28 mmol/L (ref 20–31)
Calcium: 9.6 mg/dL (ref 8.6–10.4)
Chloride: 105 mmol/L (ref 98–110)
Creat: 0.75 mg/dL (ref 0.50–0.99)
GLUCOSE: 110 mg/dL — AB (ref 65–99)
POTASSIUM: 4.4 mmol/L (ref 3.5–5.3)
Sodium: 141 mmol/L (ref 135–146)

## 2015-06-09 LAB — CBC WITH DIFFERENTIAL/PLATELET
BASOS ABS: 59 {cells}/uL (ref 0–200)
Basophils Relative: 1 %
Eosinophils Absolute: 177 cells/uL (ref 15–500)
Eosinophils Relative: 3 %
HEMATOCRIT: 45.3 % — AB (ref 35.0–45.0)
HEMOGLOBIN: 15.4 g/dL (ref 11.7–15.5)
LYMPHS ABS: 1888 {cells}/uL (ref 850–3900)
Lymphocytes Relative: 32 %
MCH: 32.8 pg (ref 27.0–33.0)
MCHC: 34 g/dL (ref 32.0–36.0)
MCV: 96.6 fL (ref 80.0–100.0)
MONO ABS: 413 {cells}/uL (ref 200–950)
MPV: 9.9 fL (ref 7.5–12.5)
Monocytes Relative: 7 %
NEUTROS ABS: 3363 {cells}/uL (ref 1500–7800)
NEUTROS PCT: 57 %
Platelets: 368 10*3/uL (ref 140–400)
RBC: 4.69 MIL/uL (ref 3.80–5.10)
RDW: 13.4 % (ref 11.0–15.0)
WBC: 5.9 10*3/uL (ref 3.8–10.8)

## 2015-06-09 MED ORDER — TRIAMCINOLONE ACETONIDE 0.1 % EX CREA: 1.0000 "application " | TOPICAL_CREAM | Freq: Two times a day (BID) | CUTANEOUS | Status: DC

## 2015-06-09 NOTE — Patient Instructions (Signed)
Continue clindamycin. If not continuing to improve follow up.  Triamcinolone for itching.  Cbc and cmp ordered today.

## 2015-06-09 NOTE — Progress Notes (Signed)
   Subjective:    Patient ID: Sherri [name], female    DOB: Nov 20, 1955, 60 y.o.   MRN: [id number]  HPI Pt presents to the clinic for hospital follow up from 06/04/15 [facility]. On 06/03/15 she was having some right breast itching. She scratched it all day. They next day she woke up and the enitire right breast and partial left breast were red, painful, and warm to touch. No fever, chills, SOB. She went to ED. WBC normal at 8.2 , potassium 3.6. Blood cultures drawn. She was given IV abx and sent home with clindamycin. It has been 6 days since starting antibiotics and would report 50 percent improvement. No fever, chills, nausea, vomiting. Right breast is still tender and itching in between breast. She has not taken norco. norco made itching worse.    Review of Systems  All other systems reviewed and are negative.      Objective:   Physical Exam  Constitutional: She is oriented to person, place, and time. She appears well-developed and well-nourished.  HENT:  Head: Normocephalic and atraumatic.  Cardiovascular: Normal rate, regular rhythm and normal heart sounds.   Pulmonary/Chest: Effort normal and breath sounds normal.    Neurological: She is alert and oriented to person, place, and time.  Psychiatric: She has a normal mood and affect. Her behavior is normal.          Assessment & Plan:  Right breast cellulitis- with 50 percent improvement and 7 days left on clindamycin will not make any changes today. Discussed to call if worsening pain, swelling, redness, and/or fever. If not resolved when finished abx please follow up.   Contact dermatitis- triamcinolone given bid.

## 2015-10-18 ENCOUNTER — Encounter: Payer: Self-pay | Admitting: Physician Assistant

## 2015-10-18 ENCOUNTER — Ambulatory Visit (INDEPENDENT_AMBULATORY_CARE_PROVIDER_SITE_OTHER): Payer: Managed Care, Other (non HMO) | Admitting: Physician Assistant

## 2015-10-18 VITALS — BP 131/76 | HR 80 | Ht 65.0 in | Wt 196.0 lb

## 2015-10-18 DIAGNOSIS — R809 Proteinuria, unspecified: Secondary | ICD-10-CM

## 2015-10-18 DIAGNOSIS — F410 Panic disorder [episodic paroxysmal anxiety] without agoraphobia: Secondary | ICD-10-CM

## 2015-10-18 DIAGNOSIS — R0789 Other chest pain: Secondary | ICD-10-CM

## 2015-10-18 DIAGNOSIS — R1032 Left lower quadrant pain: Secondary | ICD-10-CM

## 2015-10-18 LAB — POCT URINALYSIS DIPSTICK
Bilirubin, UA: NEGATIVE
Glucose, UA: NEGATIVE
KETONES UA: NEGATIVE
LEUKOCYTES UA: NEGATIVE
Nitrite, UA: NEGATIVE
PH UA: 6.5
RBC UA: NEGATIVE
SPEC GRAV UA: 1.02
Urobilinogen, UA: 0.2

## 2015-10-18 MED ORDER — METRONIDAZOLE 500 MG PO TABS: 500.0000 mg | ORAL_TABLET | Freq: Three times a day (TID) | ORAL | 0 refills | Status: DC

## 2015-10-18 MED ORDER — CIPROFLOXACIN HCL 750 MG PO TABS: 750.0000 mg | ORAL_TABLET | Freq: Two times a day (BID) | ORAL | 0 refills | Status: AC

## 2015-10-18 MED ORDER — CLONAZEPAM 0.5 MG PO TABS: 0.5000 mg | ORAL_TABLET | Freq: Two times a day (BID) | ORAL | 1 refills | Status: DC | PRN

## 2015-10-18 MED ORDER — FLUOXETINE HCL 20 MG PO TABS: ORAL_TABLET | ORAL | 1 refills | Status: DC

## 2015-10-18 NOTE — Progress Notes (Addendum)
Subjective:     Patient ID: Sherri [name], female   DOB: 1955/05/04, 60 y.o.   MRN: [id number]  HPI The patient is a 60 y.o. Caucasian female presenting today with numerous acute health complaints. Firstly, the patient notes that she has been having abdominal cramping since last Monday that she describes as a "menstrual like" knotting below her belly button. The patient notes that she is also having some lower back pain but that this is chronic for her. The patient states that it is more located on her left side and is rated 12/10 with 10 being the worst pain she has ever felt. The patient denies vomiting, diarrhea, constipation, blood in her stool or urine, increased urinary frequency, urgency, dysuria, fever, or palpitations. The patient admits to a decreased appetite, nausea, bloating, and feeling like she has to make herself eat. The patient notes that she has not had any cold sweats, sleep disturbances, weight changes, or bowel changes. The patient also states that she is having some "hot episodes" for the past 3-4 weeks. She states that she has panic attacks that are precipitated by stressful events and includes symptoms of chest pain, shortness of breath, with upper extremity muscle weakness and aches. The patient denies previous cardiac history and notes that she has discontinued her Prozac 60mg  secondary to finacial burden.   Review of Systems  Constitutional: Positive for appetite change. Negative for activity change, chills, diaphoresis, fatigue, fever and unexpected weight change.  HENT: Negative.   Eyes: Negative for pain, discharge, redness, itching and visual disturbance.  Respiratory: Positive for chest tightness and shortness of breath. Negative for cough and wheezing.   Cardiovascular: Negative for chest pain, palpitations and leg swelling.  Gastrointestinal: Positive for abdominal distention, abdominal pain and nausea. Negative for blood in stool, constipation, diarrhea and  vomiting.  Endocrine: Positive for heat intolerance.  Genitourinary: Positive for frequency and pelvic pain. Negative for decreased urine volume, difficulty urinating, dysuria, flank pain, hematuria, urgency, vaginal bleeding, vaginal discharge and vaginal pain.  Musculoskeletal: Positive for back pain. Negative for arthralgias, gait problem and joint swelling.  Skin: Negative.   Allergic/Immunologic: Positive for environmental allergies.  Neurological: Negative for dizziness, syncope, weakness, light-headedness, numbness and headaches.  Hematological: Negative.   Psychiatric/Behavioral: The patient is nervous/anxious.        Objective:   Physical Exam  Constitutional: She is oriented to person, place, and time. She appears well-developed and well-nourished. No distress.  HENT:  Head: Normocephalic and atraumatic.  Right Ear: External ear normal.  Left Ear: External ear normal.  Nose: Nose normal.  Mouth/Throat: Oropharynx is clear and moist. No oropharyngeal exudate.  Eyes: Conjunctivae and EOM are normal. Pupils are equal, round, and reactive to light. Right eye exhibits no discharge. Left eye exhibits no discharge. No scleral icterus.  Neck: Normal range of motion. Neck supple. No JVD present. No tracheal deviation present. No thyromegaly present.  Cardiovascular: Normal rate, regular rhythm, normal heart sounds and intact distal pulses.  Exam reveals no gallop and no friction rub.   No murmur heard. Pulmonary/Chest: Effort normal. No stridor. No respiratory distress. She has wheezes. She has no rales. She exhibits no tenderness.  Abdominal: Soft. Bowel sounds are normal. She exhibits no distension and no mass. There is tenderness. There is guarding. There is no rebound.  Tenderness and guarding noted in suprapubic and left lower quadrant.  Lymphadenopathy:    She has no cervical adenopathy.  Neurological: She is alert and oriented to person, place,  and time. No cranial nerve  deficit. Coordination normal.  Skin: Skin is warm. No rash noted. She is diaphoretic. No erythema. No pallor.  Psychiatric: She has a normal mood and affect. Her behavior is normal. Judgment and thought content normal.      Assessment:     [name] was seen today for abdominal pain and back pain.  Diagnoses and all orders for this visit:  Left lower quadrant pain -     POCT urinalysis dipstick -     Urine Culture -     CBC with Differential/Platelet -     COMPLETE METABOLIC PANEL WITH GFR -     metroNIDAZOLE (FLAGYL) 500 MG tablet; Take 1 tablet (500 mg total) by mouth 3 (three) times daily. For 10 days. -     ciprofloxacin (CIPRO) 750 MG tablet; Take 1 tablet (750 mg total) by mouth 2 (two) times daily. For 10 days.  Proteinuria, unspecified type -     Urine Culture -     CBC with Differential/Platelet -     COMPLETE METABOLIC PANEL WITH GFR  Atypical chest pain  Panic attack -     FLUoxetine (PROZAC) 20 MG tablet; Take one tablet daily for 7 days then increase to 2 tablets daily. -     clonazePAM (KLONOPIN) 0.5 MG tablet; Take 1 tablet (0.5 mg total) by mouth 2 (two) times daily as needed for anxiety.      Plan:     1. Left lower quadrant pain/proteinuria -   .. Results for orders placed or performed in visit on 10/18/15  POCT urinalysis dipstick  Result Value Ref Range   Color, UA dark yellow    Clarity, UA clear    Glucose, UA neg    Bilirubin, UA neg    Ketones, UA neg    Spec Grav, UA 1.020    Blood, UA neg    pH, UA 6.5    Protein, UA trace    Urobilinogen, UA 0.2    Nitrite, UA neg    Leukocytes, UA Negative Negative   Sent for culture.   Discussed with patient that I suspect her symptoms are secondary to possible diverticulitis. Patient has normal UA in-clinic today. Specimen sent for culture. Patient to obtain CBC and CMP to evaluate for potential underlying etiology. Will call patient with lab results and determine need for alterations with medical  intervention at that time. Patient to start of metronidazole 500mg  tablets three times daily for 10 days and ciprofloxacin 750mg  twice daily for 10 days. Patient to return-to-clinic if symptoms do not improve or worsen.  2. Panic attacks/atypical chest pain/Depression - Discussed with patient that her chest pain is likely secondary to panic symptoms. Patient had EKG in-clinic indicating NSR at 76bmp, no st elevation or acute changes compared to previous EKGs. Likely anxiety encouraged stress test to further evaluate. Pt declined today.  Patient to start on Prozac 20mg  tablets Take one tablet daily for 7 days then increase to 2 tablets daily. Patient given prescription for clonazepam 0.5mg  to take as needed for acute panic/anxiety attacks up to 2 times daily. Abuse potential discussed.  Patient to return-to-clinic if symptoms do not improve or worsen. Follow up with PCP.

## 2015-10-18 NOTE — Addendum Note (Signed)
Addended by: Beatris Ship L on: 10/18/2015 05:27 PM   Modules accepted: Orders

## 2015-10-19 LAB — COMPLETE METABOLIC PANEL WITH GFR
ALBUMIN: 4.2 g/dL (ref 3.6–5.1)
ALT: 46 U/L — AB (ref 6–29)
AST: 51 U/L — ABNORMAL HIGH (ref 10–35)
Alkaline Phosphatase: 120 U/L (ref 33–130)
BILIRUBIN TOTAL: 0.4 mg/dL (ref 0.2–1.2)
BUN: 8 mg/dL (ref 7–25)
CO2: 31 mmol/L (ref 20–31)
CREATININE: 0.91 mg/dL (ref 0.50–0.99)
Calcium: 9.8 mg/dL (ref 8.6–10.4)
Chloride: 105 mmol/L (ref 98–110)
GFR, EST AFRICAN AMERICAN: 79 mL/min (ref >=60)
GFR, Est Non African American: 68 mL/min (ref >=60)
GLUCOSE: 108 mg/dL — AB (ref 65–99)
Potassium: 4.2 mmol/L (ref 3.5–5.3)
SODIUM: 143 mmol/L (ref 135–146)
TOTAL PROTEIN: 6.6 g/dL (ref 6.1–8.1)

## 2015-10-19 LAB — CBC WITH DIFFERENTIAL/PLATELET
BASOS PCT: 1 %
Basophils Absolute: 72 cells/uL (ref 0–200)
EOS ABS: 216 {cells}/uL (ref 15–500)
Eosinophils Relative: 3 %
HCT: 44.5 % (ref 35.0–45.0)
HEMOGLOBIN: 15 g/dL (ref 11.7–15.5)
LYMPHS ABS: 2232 {cells}/uL (ref 850–3900)
Lymphocytes Relative: 31 %
MCH: 32.8 pg (ref 27.0–33.0)
MCHC: 33.7 g/dL (ref 32.0–36.0)
MCV: 97.4 fL (ref 80.0–100.0)
MONOS PCT: 9 %
MPV: 10.1 fL (ref 7.5–12.5)
Monocytes Absolute: 648 cells/uL (ref 200–950)
NEUTROS ABS: 4032 {cells}/uL (ref 1500–7800)
Neutrophils Relative %: 56 %
PLATELETS: 341 10*3/uL (ref 140–400)
RBC: 4.57 MIL/uL (ref 3.80–5.10)
RDW: 13 % (ref 11.0–15.0)
WBC: 7.2 10*3/uL (ref 3.8–10.8)

## 2015-10-20 LAB — URINE CULTURE: ORGANISM ID, BACTERIA: NO GROWTH

## 2015-12-28 ENCOUNTER — Other Ambulatory Visit: Payer: Self-pay | Admitting: Physician Assistant

## 2015-12-28 DIAGNOSIS — F410 Panic disorder [episodic paroxysmal anxiety] without agoraphobia: Secondary | ICD-10-CM

## 2016-01-31 ENCOUNTER — Other Ambulatory Visit: Payer: Self-pay | Admitting: Family Medicine

## 2016-01-31 ENCOUNTER — Telehealth: Payer: Self-pay | Admitting: *Deleted

## 2016-01-31 DIAGNOSIS — F410 Panic disorder [episodic paroxysmal anxiety] without agoraphobia: Secondary | ICD-10-CM

## 2016-01-31 NOTE — Telephone Encounter (Signed)
lvm advising pt that she was to make a f/u appt with pcp for refills for this med. Asked that she call office to schedule.Sherri [name] [place]

## 2016-02-15 ENCOUNTER — Encounter: Payer: Self-pay | Admitting: Family Medicine

## 2016-02-15 ENCOUNTER — Ambulatory Visit (INDEPENDENT_AMBULATORY_CARE_PROVIDER_SITE_OTHER): Payer: Managed Care, Other (non HMO) | Admitting: Family Medicine

## 2016-02-15 VITALS — BP 158/79 | HR 50 | Temp 98.7°F | Ht 65.0 in | Wt 192.0 lb

## 2016-02-15 DIAGNOSIS — F410 Panic disorder [episodic paroxysmal anxiety] without agoraphobia: Secondary | ICD-10-CM | POA: Diagnosis not present

## 2016-02-15 DIAGNOSIS — J111 Influenza due to unidentified influenza virus with other respiratory manifestations: Secondary | ICD-10-CM | POA: Diagnosis not present

## 2016-02-15 MED ORDER — FLUOXETINE HCL 20 MG PO TABS: 40.0000 mg | ORAL_TABLET | Freq: Every day | ORAL | 0 refills | Status: DC

## 2016-02-15 MED ORDER — OSELTAMIVIR PHOSPHATE 75 MG PO CAPS: 75.0000 mg | ORAL_CAPSULE | Freq: Two times a day (BID) | ORAL | 0 refills | Status: DC

## 2016-02-15 NOTE — Patient Instructions (Addendum)

## 2016-02-15 NOTE — Progress Notes (Signed)
   Subjective:    Patient ID: Sherri [name], female    DOB: October 04, 1955, 61 y.o.   MRN: [id number]  HPI 61 year old female comes in today with 2 days of cold type symptoms. pt reports that she has been weak and unable to eat, vomiting, has had mild  congestion, hard to breath, fevers off/on last fever was yesterday of 101. her ears have been bothering her x 1 wk her L ear has been bothering her more than her R.  She's also had a very sore throat.   Review of Systems     Objective:   Physical Exam  Constitutional: She is oriented to person, place, and time. She appears well-developed and well-nourished.  HENT:  Head: Normocephalic and atraumatic.  Right Ear: External ear normal.  Left Ear: External ear normal.  Nose: Nose normal.  Mouth/Throat: Oropharynx is clear and moist.  TMs and canals are clear.   Eyes: Conjunctivae and EOM are normal. Pupils are equal, round, and reactive to light.  Neck: Neck supple. No thyromegaly present.  Cardiovascular: Normal rate, regular rhythm and normal heart sounds.   Pulmonary/Chest: Effort normal and breath sounds normal. She has no wheezes.  Lymphadenopathy:    She has no cervical adenopathy.  Neurological: She is alert and oriented to person, place, and time.  Skin: Skin is warm and dry.  Psychiatric: She has a normal mood and affect.        Assessment & Plan:  Influenza-she does meet criteria for the flu with sore throat, cough, fever. She's had more congestion versus runny nose. Related to the Tamiflu since she's within the 48 hour window. Call if not significantly better in one week.

## 2016-05-13 ENCOUNTER — Other Ambulatory Visit: Payer: Self-pay | Admitting: Family Medicine

## 2016-05-13 DIAGNOSIS — F410 Panic disorder [episodic paroxysmal anxiety] without agoraphobia: Secondary | ICD-10-CM

## 2016-05-27 ENCOUNTER — Other Ambulatory Visit: Payer: Self-pay | Admitting: Family Medicine

## 2016-05-27 DIAGNOSIS — F410 Panic disorder [episodic paroxysmal anxiety] without agoraphobia: Secondary | ICD-10-CM

## 2016-06-03 ENCOUNTER — Other Ambulatory Visit: Payer: Self-pay | Admitting: Family Medicine

## 2016-06-03 DIAGNOSIS — F410 Panic disorder [episodic paroxysmal anxiety] without agoraphobia: Secondary | ICD-10-CM

## 2016-10-21 ENCOUNTER — Ambulatory Visit (INDEPENDENT_AMBULATORY_CARE_PROVIDER_SITE_OTHER): Payer: Managed Care, Other (non HMO) | Admitting: Family Medicine

## 2016-10-21 VITALS — Temp 98.8°F

## 2016-10-21 DIAGNOSIS — Z23 Encounter for immunization: Secondary | ICD-10-CM

## 2016-10-21 NOTE — Progress Notes (Signed)
Pt here for flu shot. Afebrile,no recent illness. Vaccination given, pt tolerated well..Sherri [name]  

## 2019-01-21 ENCOUNTER — Encounter: Payer: Self-pay | Admitting: Family Medicine

## 2019-01-21 ENCOUNTER — Other Ambulatory Visit: Payer: Self-pay

## 2019-01-21 ENCOUNTER — Ambulatory Visit (INDEPENDENT_AMBULATORY_CARE_PROVIDER_SITE_OTHER): Payer: Managed Care, Other (non HMO) | Admitting: Family Medicine

## 2019-01-21 ENCOUNTER — Encounter: Payer: Self-pay | Admitting: Gastroenterology

## 2019-01-21 VITALS — BP 154/72 | HR 74 | Ht 65.0 in | Wt 180.0 lb

## 2019-01-21 DIAGNOSIS — K529 Noninfective gastroenteritis and colitis, unspecified: Secondary | ICD-10-CM | POA: Diagnosis not present

## 2019-01-21 DIAGNOSIS — Z1211 Encounter for screening for malignant neoplasm of colon: Secondary | ICD-10-CM

## 2019-01-21 DIAGNOSIS — Z23 Encounter for immunization: Secondary | ICD-10-CM | POA: Diagnosis not present

## 2019-01-21 DIAGNOSIS — R109 Unspecified abdominal pain: Secondary | ICD-10-CM | POA: Diagnosis not present

## 2019-01-21 NOTE — Progress Notes (Signed)
Established Patient Office Visit  Subjective:  Patient ID: Sherri [name], female    DOB: April 08, 1955  Age: 64 y.o. MRN: [id number]  CC:  Chief Complaint  Patient presents with  . Abdominal Pain    HPI Sherri [name] presents for chronic intermittent diarrhea that is been going on for couple of years now.  He has never had any type of colon cancer screening.  She says her mom has problems with her bowels she has a diagnosis of diverticulitis.  Her maternal grandmother actually had stomach cancer.  And she has a maternal aunt who has "stomach problems".  But does not know what the specific diagnosis is.  She says her diarrhea is definitely triggered by certain foods such as dairy, chocolate, eggs, cheese and Mayo.  She says she can eat something really spicy and it does not seem to trigger her Adderall.  Typically she does get stomach cramps with the diarrhea and it can last hours even into the next day.  She never sees any blood with the stool.  When she does have diarrhea it usually more watery.  She denies any fevers, chills or abnormal weight loss.  She denies any trouble swallowing or dysphagia.  She denies any excess heartburn or excess gas.  Though occasionally she feels bloated and tender in the abdomen after episodes of diarrhea.  She has never had any type of colon cancer screening or colonoscopy even though she says that [place] spoken to her about it multiple times.  She would really like to know what the triggers and causes are so she can get better control of it.  She says even after cutting out dairy and making some changes it still happens occasionally but is just not as frequent.   Past Medical History:  Diagnosis Date  . Anxiety   . Arthritis   . Asthma   . COPD (chronic obstructive pulmonary disease) ([place])   . Depression   . Pneumonia   . Recurrent upper respiratory infection (URI)     Past Surgical History:  Procedure Laterality Date  . ankle sx  1973  .  APPENDECTOMY    . BREAST REDUCTION SURGERY  06/24/2011   Procedure: MAMMARY REDUCTION  (BREAST);  Surgeon: Cristine Polio, MD;  Location: [facility];  Service: Plastics;  Laterality: Bilateral;  . CERVICAL DISC SURGERY     (? laminectomy?)  . CESAREAN SECTION  1976  . left groin hernia  1974  . TONSILLECTOMY      Family History  Problem Relation Age of Onset  . Hypertension Mother   . Depression Mother   . Alzheimer's disease Mother   . Diverticulitis Mother   . Alcohol abuse Father   . Diabetes Father   . Cancer Other        pancreatic  . Stomach cancer Maternal Grandmother     Social History   Socioeconomic History  . Marital status: Married    Spouse name: Not on file  . Number of children: Not on file  . Years of education: Not on file  . Highest education level: Not on file  Occupational History  . Not on file  Tobacco Use  . Smoking status: Current Every Day Smoker    Packs/day: 0.50    Years: 35.00    Pack years: 17.50    Types: Cigarettes    Last attempt to quit: 04/28/2011    Years since quitting: 7.7  . Smokeless tobacco: Never Used  Substance and Sexual Activity  . Alcohol use: No    Alcohol/week: 0.0 standard drinks  . Drug use: No  . Sexual activity: Not on file  Other Topics Concern  . Not on file  Social History Narrative  . Not on file   Social Determinants of Health   Financial Resource Strain:   . Difficulty of Paying Living Expenses: Not on file  Food Insecurity:   . Worried About Charity fundraiser in the Last Year: Not on file  . Ran Out of Food in the Last Year: Not on file  Transportation Needs:   . Lack of Transportation (Medical): Not on file  . Lack of Transportation (Non-Medical): Not on file  Physical Activity:   . Days of Exercise per Week: Not on file  . Minutes of Exercise per Session: Not on file  Stress:   . Feeling of Stress : Not on file  Social Connections:   . Frequency of Communication with  Friends and Family: Not on file  . Frequency of Social Gatherings with Friends and Family: Not on file  . Attends Religious Services: Not on file  . Active Member of Clubs or Organizations: Not on file  . Attends Archivist Meetings: Not on file  . Marital Status: Not on file  Intimate Partner Violence:   . Fear of Current or Ex-Partner: Not on file  . Emotionally Abused: Not on file  . Physically Abused: Not on file  . Sexually Abused: Not on file    Outpatient Medications Prior to Visit  Medication Sig Dispense Refill  . FLUoxetine (PROZAC) 20 MG tablet Take 2 tablets (40 mg total) by mouth daily. MUST make follow up appointment with pcp. 60 tablet 0   No facility-administered medications prior to visit.    Allergies  Allergen Reactions  . Penicillins     REACTION: hives    ROS Review of Systems    Objective:    Physical Exam  Constitutional: She is oriented to person, place, and time. She appears well-developed and well-nourished.  HENT:  Head: Normocephalic and atraumatic.  Cardiovascular: Normal rate, regular rhythm and normal heart sounds.  Pulmonary/Chest: Effort normal and breath sounds normal.  Abdominal: Soft. Bowel sounds are normal. She exhibits no distension and no mass. There is abdominal tenderness. There is no rebound and no guarding.  Mild tenderness in the right and left lower quadrants.  Neurological: She is alert and oriented to person, place, and time.  Skin: Skin is warm and dry.  Psychiatric: She has a normal mood and affect. Her behavior is normal.    BP (!) 154/72   Pulse 74   Ht '5\' 5"'$  (1.651 m)   Wt 180 lb (81.6 kg)   SpO2 99%   BMI 29.95 kg/m  Wt Readings from Last 3 Encounters:  01/21/19 180 lb (81.6 kg)  02/15/16 192 lb (87.1 kg)  10/18/15 196 lb (88.9 kg)     Health Maintenance Due  Topic Date Due  . Hepatitis C Screening  1955/11/30  . HIV Screening  07/06/1969  . COLONOSCOPY  07/06/2004  . PAP SMEAR-Modifier   02/26/2008  . TETANUS/TDAP  03/02/2015  . MAMMOGRAM  01/24/2017    There are no preventive care reminders to display for this patient.  Lab Results  Component Value Date   TSH 2.547 09/13/2009   Lab Results  Component Value Date   WBC 7.2 10/18/2015   HGB 15.0 10/18/2015   HCT 44.5 10/18/2015  MCV 97.4 10/18/2015   PLT 341 10/18/2015   Lab Results  Component Value Date   NA 143 10/18/2015   K 4.2 10/18/2015   CO2 31 10/18/2015   GLUCOSE 108 (H) 10/18/2015   BUN 8 10/18/2015   CREATININE 0.91 10/18/2015   BILITOT 0.4 10/18/2015   ALKPHOS 120 10/18/2015   AST 51 (H) 10/18/2015   ALT 46 (H) 10/18/2015   PROT 6.6 10/18/2015   ALBUMIN 4.2 10/18/2015   CALCIUM 9.8 10/18/2015   Lab Results  Component Value Date   CHOL 220 (H) 10/12/2008   Lab Results  Component Value Date   HDL 60 10/12/2008   Lab Results  Component Value Date   LDLCALC 138 (H) 10/12/2008   Lab Results  Component Value Date   TRIG 109 10/12/2008   Lab Results  Component Value Date   CHOLHDL 3.7 Ratio 10/12/2008   No results found for: HGBA1C    Assessment & Plan:   Problem List Items Addressed This Visit      Digestive   Chronic diarrhea - Primary    Could discuss potential diagnoses.  It definitely sounds like she has very specific food triggers so we will check for IgG levels for some of those foods to see if she may have a Tru-Cut sensitivity.  She never developed rash or shortness of breath some low suspicious of a true allergy.  She is very concerned about possibility of Crohn's disease even though she does not have a family history but we will do labs to rule that out as well I think that will give her extra reassurance.  Also convinced her to consider colon cancer screening and she is willing to be referred at this point we will go ahead and place that referral.  In the meantime definitely avoid those foods that are trigger foods.      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   Follow-up: Return in about 2 weeks (around 02/04/2019) for bp check with nurse.    Beatrice Lecher, MD

## 2019-01-21 NOTE — Assessment & Plan Note (Signed)
Could discuss potential diagnoses.  It definitely sounds like she has very specific food triggers so we will check for IgG levels for some of those foods to see if she may have a Tru-Cut sensitivity.  She never developed rash or shortness of breath some low suspicious of a true allergy.  She is very concerned about possibility of Crohn's disease even though she does not have a family history but we will do labs to rule that out as well I think that will give her extra reassurance.  Also convinced her to consider colon cancer screening and she is willing to be referred at this point we will go ahead and place that referral.  In the meantime definitely avoid those foods that are trigger foods.

## 2019-01-26 LAB — FOOD PANEL II IGG
APPLE (F49) IGG: <2 ug/mL (ref <2.0)
BANANA (F92) IGG: 2.4 ug/mL — ABNORMAL HIGH (ref <2.0)
Beef, IgG: 9.3 ug/mL — ABNORMAL HIGH (ref <2.0)
CHICKEN MEAT (F83) IGG: <2 ug/mL (ref <2.0)
Cacao (Chocolate)IgG: <2 ug/mL (ref <2.0)
Casein IgE: <2 ug/mL (ref <2.0)
Corn IgG: 2 ug/mL — ABNORMAL HIGH (ref <2.0)
Egg white, IgG: <2 ug/mL (ref <2.0)
Orange IgG: <2 ug/mL (ref <2.0)
POTATO (F35) IGG: <2 ug/mL (ref <2.0)
Soybean IgG: <2 ug/mL (ref <2.0)
Tomato IgG: <2 ug/mL (ref <2.0)
Wheat IgG: <2 ug/mL (ref <2.0)

## 2019-01-26 LAB — FOOD ALLERGY PROFILE
Allergen, Salmon, f41: <0.1 kU/L
Almonds: <0.1 kU/L
CLASS: 0
CLASS: 0
CLASS: 0
CLASS: 0
CLASS: 0
CLASS: 0
CLASS: 0
CLASS: 0
CLASS: 0
CLASS: 0
CLASS: 0
Cashew IgE: <0.1 kU/L
Class: 0
Class: 0
Class: 0
Class: 0
Egg White IgE: <0.1 kU/L
Fish Cod: <0.1 kU/L
Hazelnut: <0.1 kU/L
Milk IgE: <0.1 kU/L
Peanut IgE: <0.1 kU/L
Scallop IgE: <0.1 kU/L
Sesame Seed f10: <0.1 kU/L
Shrimp IgE: <0.1 kU/L
Soybean IgE: <0.1 kU/L
Tuna IgE: <0.1 kU/L
Walnut: <0.1 kU/L
Wheat IgE: <0.1 kU/L

## 2019-01-26 LAB — COMPLETE METABOLIC PANEL WITH GFR
AG Ratio: 2 (calc) (ref 1.0–2.5)
ALT: 11 U/L (ref 6–29)
AST: 18 U/L (ref 10–35)
Albumin: 4.5 g/dL (ref 3.6–5.1)
Alkaline phosphatase (APISO): 92 U/L (ref 37–153)
BUN: 11 mg/dL (ref 7–25)
CO2: 29 mmol/L (ref 20–32)
Calcium: 9.8 mg/dL (ref 8.6–10.4)
Chloride: 106 mmol/L (ref 98–110)
Creat: 0.84 mg/dL (ref 0.50–0.99)
GFR, Est African American: 85 mL/min/{1.73_m2} (ref >=60)
GFR, Est Non African American: 73 mL/min/{1.73_m2} (ref >=60)
Globulin: 2.2 g/dL (calc) (ref 1.9–3.7)
Glucose, Bld: 97 mg/dL (ref 65–139)
Potassium: 4.4 mmol/L (ref 3.5–5.3)
Sodium: 143 mmol/L (ref 135–146)
Total Bilirubin: 0.5 mg/dL (ref 0.2–1.2)
Total Protein: 6.7 g/dL (ref 6.1–8.1)

## 2019-01-26 LAB — SACCHAROMYCES CEREVISIAE ANTIBODIES, IGG AND IGA
SACCHAROMYCES CEREVISIAE AB (ASCA)(IGA): 3.2 U (ref <=20.0)
SACCHAROMYCES CEREVISIAE AB (ASCA)(IGG): 6.3 U (ref <=20.0)

## 2019-01-26 LAB — CBC
HCT: 46 % — ABNORMAL HIGH (ref 35.0–45.0)
Hemoglobin: 16.2 g/dL — ABNORMAL HIGH (ref 11.7–15.5)
MCH: 33.6 pg — ABNORMAL HIGH (ref 27.0–33.0)
MCHC: 35.2 g/dL (ref 32.0–36.0)
MCV: 95.4 fL (ref 80.0–100.0)
MPV: 10.3 fL (ref 7.5–12.5)
Platelets: 332 10*3/uL (ref 140–400)
RBC: 4.82 10*6/uL (ref 3.80–5.10)
RDW: 11.9 % (ref 11.0–15.0)
WBC: 6.9 10*3/uL (ref 3.8–10.8)

## 2019-01-26 LAB — ALLERGEN, BETA-LACTOGLOB,F77
Allergen, Beta-lactoglob,f77: <0.1 kU/L
Class: 0

## 2019-01-26 LAB — SEDIMENTATION RATE: Sed Rate: 6 mm/h (ref 0–30)

## 2019-01-26 LAB — INTERPRETATION:

## 2019-02-03 ENCOUNTER — Ambulatory Visit (AMBULATORY_SURGERY_CENTER): Payer: Self-pay | Admitting: *Deleted

## 2019-02-03 ENCOUNTER — Other Ambulatory Visit: Payer: Self-pay

## 2019-02-03 VITALS — Temp 97.3°F | Ht 65.0 in | Wt 178.0 lb

## 2019-02-03 DIAGNOSIS — Z1211 Encounter for screening for malignant neoplasm of colon: Secondary | ICD-10-CM

## 2019-02-03 DIAGNOSIS — Z01818 Encounter for other preprocedural examination: Secondary | ICD-10-CM

## 2019-02-03 MED ORDER — NA SULFATE-K SULFATE-MG SULF 17.5-3.13-1.6 GM/177ML PO SOLN: ORAL | 0 refills | Status: DC

## 2019-02-03 NOTE — Progress Notes (Signed)
Patient is here in-person for PV. Patient denies any allergies to eggs or soy. Patient denies any problems with anesthesia/sedation. Patient denies any oxygen use at home. Patient denies taking any diet/weight loss medications or blood thinners. Patient is not being treated for MRSA or C-diff. EMMI education assisgned to the patient for the procedure, this was explained and instructions given to patient. COVID-19 screening test is on 1/28, the pt is aware. Pt is aware that care partner will wait in the car during procedure; if they feel like they will be too hot or cold to wait in the car; they may wait in the 4 th floor lobby. Patient is aware to bring only one care partner. We want them to wear a mask (we do not have any that we can provide them), practice social distancing, and we will check their temperatures when they get here.  I did remind the patient that their care partner needs to stay in the parking lot the entire time and have a cell phone available, we will call them when the pt is ready for discharge. Patient will wear mask into building.    Suprep $15 off coupon given to the patient.

## 2019-02-04 ENCOUNTER — Encounter: Payer: Self-pay | Admitting: Gastroenterology

## 2019-02-04 ENCOUNTER — Ambulatory Visit (INDEPENDENT_AMBULATORY_CARE_PROVIDER_SITE_OTHER): Payer: Managed Care, Other (non HMO) | Admitting: Family Medicine

## 2019-02-04 VITALS — BP 142/90 | HR 69 | Wt 180.0 lb

## 2019-02-04 DIAGNOSIS — I10 Essential (primary) hypertension: Secondary | ICD-10-CM | POA: Diagnosis not present

## 2019-02-04 MED ORDER — HYDROCHLOROTHIAZIDE 25 MG PO TABS: 25.0000 mg | ORAL_TABLET | Freq: Every day | ORAL | 0 refills | Status: DC

## 2019-02-04 NOTE — Progress Notes (Signed)
   Subjective:    Patient ID: Sherri [name], female    DOB: 11/28/1955, 64 y.o.   MRN: [id number]  HPI Patient here for blood pressure check. Patient complains of H/A occasionally and ibuprofen helps with this. No complaints of dizziness, SOB, chest pain or palpitations.   Review of Systems     Objective:   Physical Exam        Assessment & Plan:  Per Dr. Madilyn Fireman adding HCTZ 25mg  take one tablet daily.  Sent this to [facility] for the patient. Called and notified patient as well to start new med and follow back up in office in 2-3 weeks for BP check. KG LPN  Agree with documentation as above.   Beatrice Lecher, MD

## 2019-02-11 ENCOUNTER — Ambulatory Visit (INDEPENDENT_AMBULATORY_CARE_PROVIDER_SITE_OTHER): Payer: Self-pay

## 2019-02-11 ENCOUNTER — Telehealth: Payer: Self-pay | Admitting: Family Medicine

## 2019-02-11 ENCOUNTER — Other Ambulatory Visit: Payer: Self-pay | Admitting: Gastroenterology

## 2019-02-11 DIAGNOSIS — Z1159 Encounter for screening for other viral diseases: Secondary | ICD-10-CM

## 2019-02-11 NOTE — Telephone Encounter (Signed)
Patient called and has got a new blood pressure cuff and is taking the Hydrochlorothiazide that was sent in as well. Her blood pressure this morning was 138/82.  She was reading over the colonoscopy recommendations and was told that she may need to reschedule her colonoscopy for next Tuesday if her blood pressure was still elevated. She does not want to go through the prep work and then be told she has to re-schedule. Patient wants to know if you want her to re-schedule for another time or go ahead. She is going to get covid tested today. Please advise.

## 2019-02-11 NOTE — Telephone Encounter (Signed)
I think her BP looks great and she is OK to get her scope done.  Continue BP med

## 2019-02-12 LAB — SARS CORONAVIRUS 2 (TAT 6-24 HRS): SARS Coronavirus 2: NEGATIVE

## 2019-02-12 NOTE — Telephone Encounter (Signed)
Patient advised.

## 2019-02-16 ENCOUNTER — Encounter: Payer: Self-pay | Admitting: Gastroenterology

## 2019-02-16 ENCOUNTER — Ambulatory Visit (AMBULATORY_SURGERY_CENTER): Payer: Managed Care, Other (non HMO) | Admitting: Gastroenterology

## 2019-02-16 ENCOUNTER — Other Ambulatory Visit: Payer: Self-pay

## 2019-02-16 VITALS — BP 111/83 | HR 92 | Temp 96.2°F | Resp 21 | Ht 65.0 in | Wt 178.0 lb

## 2019-02-16 DIAGNOSIS — K635 Polyp of colon: Secondary | ICD-10-CM | POA: Diagnosis not present

## 2019-02-16 DIAGNOSIS — Z1211 Encounter for screening for malignant neoplasm of colon: Secondary | ICD-10-CM | POA: Diagnosis not present

## 2019-02-16 DIAGNOSIS — D128 Benign neoplasm of rectum: Secondary | ICD-10-CM

## 2019-02-16 DIAGNOSIS — D122 Benign neoplasm of ascending colon: Secondary | ICD-10-CM

## 2019-02-16 MED ORDER — SODIUM CHLORIDE 0.9 % IV SOLN: 500.0000 mL | Freq: Once | INTRAVENOUS | Status: DC

## 2019-02-16 NOTE — Progress Notes (Signed)
Report given to PACU, vss 

## 2019-02-16 NOTE — Progress Notes (Signed)
Called to room to assist during endoscopic procedure.  Patient ID and intended procedure confirmed with present staff. Received instructions for my participation in the procedure from the performing physician.  

## 2019-02-16 NOTE — Progress Notes (Signed)
Temperature taken by J.B., VS taken by N.C. 

## 2019-02-16 NOTE — Patient Instructions (Signed)
Handouts provided on polyps, diverticulosis, and hemorrhoids.   YOU HAD AN ENDOSCOPIC PROCEDURE TODAY AT [facility]:   Refer to the procedure report that was given to you for any specific questions about what was found during the examination.  If the procedure report does not answer your questions, please call your gastroenterologist to clarify.  If you requested that your care partner not be given the details of your procedure findings, then the procedure report has been included in a sealed envelope for you to review at your convenience later.  YOU SHOULD EXPECT: Some feelings of bloating in the abdomen. Passage of more gas than usual.  Walking can help get rid of the air that was put into your GI tract during the procedure and reduce the bloating. If you had a lower endoscopy (such as a colonoscopy or flexible sigmoidoscopy) you may notice spotting of blood in your stool or on the toilet paper. If you underwent a bowel prep for your procedure, you may not have a normal bowel movement for a few days.  Please Note:  You might notice some irritation and congestion in your nose or some drainage.  This is from the oxygen used during your procedure.  There is no need for concern and it should clear up in a day or so.  SYMPTOMS TO REPORT IMMEDIATELY:   Following lower endoscopy (colonoscopy or flexible sigmoidoscopy):  Excessive amounts of blood in the stool  Significant tenderness or worsening of abdominal pains  Swelling of the abdomen that is new, acute  Fever of 100F or higher   For urgent or emergent issues, a gastroenterologist can be reached at any hour by calling [phone].   DIET:  We do recommend a small meal at first, but then you may proceed to your regular diet.  Drink plenty of fluids but you should avoid alcoholic beverages for 24 hours.  ACTIVITY:  You should plan to take it easy for the rest of today and you should NOT DRIVE or use heavy machinery until  tomorrow (because of the sedation medicines used during the test).    FOLLOW UP: Our staff will call the number listed on your records 48-72 hours following your procedure to check on you and address any questions or concerns that you may have regarding the information given to you following your procedure. If we do not reach you, we will leave a message.  We will attempt to reach you two times.  During this call, we will ask if you have developed any symptoms of COVID 19. If you develop any symptoms (ie: fever, flu-like symptoms, shortness of breath, cough etc.) before then, please call [phone].  If you test positive for Covid 19 in the 2 weeks post procedure, please call and report this information to [place].    If any biopsies were taken you will be contacted by phone or by letter within the next 1-3 weeks.  Please call us at [phone] if you have not heard about the biopsies in 3 weeks.    SIGNATURES/CONFIDENTIALITY: You and/or your care partner have signed paperwork which will be entered into your electronic medical record.  These signatures attest to the fact that that the information above on your After Visit Summary has been reviewed and is understood.  Full responsibility of the confidentiality of this discharge information lies with you and/or your care-partner.

## 2019-02-16 NOTE — Op Note (Signed)
[name] Procedure Date: 02/16/2019 9:48 AM MRN: [id number] Endoscopist: Mauri Pole , MD Age: 64 Referring MD:  Date of Birth: [birth year] Gender: Female Account #: [id number] Procedure:                Colonoscopy Indications:              Screening for colorectal malignant neoplasm Medicines:                Monitored Anesthesia Care Procedure:                Pre-Anesthesia Assessment:                           - Prior to the procedure, a History and Physical                            was performed, and patient medications and                            allergies were reviewed. The patient's tolerance of                            previous anesthesia was also reviewed. The risks                            and benefits of the procedure and the sedation                            options and risks were discussed with the patient.                            All questions were answered, and informed consent                            was obtained. Prior Anticoagulants: The patient has                            taken no previous anticoagulant or antiplatelet                            agents. ASA Grade Assessment: II - A patient with                            mild systemic disease. After reviewing the risks                            and benefits, the patient was deemed in                            satisfactory condition to undergo the procedure.                           After obtaining informed consent, the colonoscope  was passed under direct vision. Throughout the                            procedure, the patient's blood pressure, pulse, and                            oxygen saturations were monitored continuously. The                            Colonoscope was introduced through the anus and                            advanced to the the cecum, identified by                            appendiceal orifice  and ileocecal valve. The                            colonoscopy was performed without difficulty. The                            patient tolerated the procedure well. The quality                            of the bowel preparation was good. The ileocecal                            valve, appendiceal orifice, and rectum were                            photographed. Scope In: 10:00:29 AM Scope Out: 10:14:23 AM Scope Withdrawal Time: 0 hours 12 minutes 15 seconds  Total Procedure Duration: 0 hours 13 minutes 54 seconds  Findings:                 The perianal and digital rectal examinations were                            normal.                           A 7 mm polyp was found in the ascending colon. The                            polyp was sessile. The polyp was removed with a                            cold snare. Resection and retrieval were complete.                           Scattered small and large-mouthed diverticula were                            found in the sigmoid colon, descending colon,  transverse colon and ascending colon.                           A 1 mm polypoid lesion was found at the anus. The                            lesion was sessile. No bleeding was present.                            Biopsies were taken with a cold forceps for                            histology to exclude AIN.                           Non-bleeding internal hemorrhoids were found during                            retroflexion. The hemorrhoids were small. Complications:            No immediate complications. Estimated Blood Loss:     Estimated blood loss was minimal. Impression:               - One 7 mm polyp in the ascending colon, removed                            with a cold snare. Resected and retrieved.                           - Moderate diverticulosis in the sigmoid colon, in                            the descending colon, in the transverse colon and                             in the ascending colon.                           - Likely benign polypoid lesion at the anus.                            Biopsied.                           - Non-bleeding internal hemorrhoids. Recommendation:           - Patient has a contact number available for                            emergencies. The signs and symptoms of potential                            delayed complications were discussed with the                            patient. Return  to normal activities tomorrow.                            Written discharge instructions were provided to the                            patient.                           - Resume previous diet.                           - Continue present medications.                           - Await pathology results.                           - Repeat colonoscopy in 5-10 years for surveillance                            based on pathology results. Mauri Pole, MD 02/16/2019 10:18:50 AM This report has been signed electronically.

## 2019-02-16 NOTE — Progress Notes (Signed)
Pt's states no medical or surgical changes since previsit or office visit. 

## 2019-02-18 ENCOUNTER — Telehealth: Payer: Self-pay

## 2019-02-18 NOTE — Telephone Encounter (Signed)
  Follow up Call-  Call back number 02/16/2019  Post procedure Call Back phone  # [phone]  Permission to leave phone message Yes  Some recent data might be hidden     Patient questions:  Do you have a fever, pain , or abdominal swelling? No. Pain Score  0 *  Have you tolerated food without any problems? Yes.    Have you been able to return to your normal activities? Yes.    Do you have any questions about your discharge instructions: Diet   No. Medications  No. Follow up visit  No.  Do you have questions or concerns about your Care? No.  Actions: * If pain score is 4 or above: No action needed, pain <4.  1. Have you developed a fever since your procedure? no  2.   Have you had an respiratory symptoms (SOB or cough) since your procedure? no  3.   Have you tested positive for COVID 19 since your procedure no  4.   Have you had any family members/close contacts diagnosed with the COVID 19 since your procedure?  no   If yes to any of these questions please route to Joylene John, RN and Alphonsa Gin, Therapist, sports.

## 2019-02-19 ENCOUNTER — Encounter: Payer: Self-pay | Admitting: Gastroenterology

## 2019-02-25 ENCOUNTER — Ambulatory Visit: Payer: Managed Care, Other (non HMO)

## 2019-02-25 ENCOUNTER — Ambulatory Visit (INDEPENDENT_AMBULATORY_CARE_PROVIDER_SITE_OTHER): Payer: Managed Care, Other (non HMO) | Admitting: Family Medicine

## 2019-02-25 ENCOUNTER — Encounter: Payer: Self-pay | Admitting: Family Medicine

## 2019-02-25 ENCOUNTER — Other Ambulatory Visit: Payer: Self-pay

## 2019-02-25 VITALS — BP 131/69 | HR 67 | Ht 65.0 in | Wt 177.0 lb

## 2019-02-25 DIAGNOSIS — Z23 Encounter for immunization: Secondary | ICD-10-CM | POA: Diagnosis not present

## 2019-02-25 DIAGNOSIS — F439 Reaction to severe stress, unspecified: Secondary | ICD-10-CM

## 2019-02-25 DIAGNOSIS — K635 Polyp of colon: Secondary | ICD-10-CM | POA: Diagnosis not present

## 2019-02-25 DIAGNOSIS — I10 Essential (primary) hypertension: Secondary | ICD-10-CM | POA: Insufficient documentation

## 2019-02-25 NOTE — Progress Notes (Signed)
Acute Office Visit  Subjective:    Patient ID: Sherri [name], female    DOB: October 18, 1955, 64 y.o.   MRN: [id number]  Chief Complaint  Patient presents with  . Results    colonoscopy done 02/16/2019 would like pcp's input on next steps  . Hypertension    HPI Patient is in today for to go over her recent colonoscopy results.  She went for screening colonoscopy.  She is asymptomatic and not having any specific concerns.  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  A second polyp was removed but it was normal.  She says she was notified by the nurse over at [facility] that she would need consultation with the surgeon for this precancerous lesion.  But says she was confused because she then received a letter stating that she just needed a repeat colonoscopy in 5 years.  She is just been feeling really stressed and overwhelmed recently and this just added to the mix for her.  He has a cousin in [place] who has been hospitalized since December for Covid and now has a permanent trach.  She has still not seen her mother in the most a year and a half who lives in [place].]  Hypertension- Pt denies chest pain, SOB, dizziness, or heart palpitations.  Taking meds as directed w/o problems.  Denies medication side effects.      Past Medical History:  Diagnosis Date  . Anxiety   . Arthritis   . Asthma   . COPD (chronic obstructive pulmonary disease) ([place])   . Depression   . Pneumonia   . Recurrent upper respiratory infection (URI)     Past Surgical History:  Procedure Laterality Date  . ankle sx  1973  . APPENDECTOMY    . BREAST REDUCTION SURGERY  06/24/2011   Procedure: MAMMARY REDUCTION  (BREAST);  Surgeon: Cristine Polio, MD;  Location: [facility];  Service: Plastics;  Laterality: Bilateral;  . CERVICAL DISC SURGERY     (? laminectomy?)  . CESAREAN SECTION  1976  . left groin hernia  1974  . TONSILLECTOMY       Family History  Problem Relation Age of Onset  . Hypertension Mother   . Depression Mother   . Alzheimer's disease Mother   . Diverticulitis Mother   . Alcohol abuse Father   . Diabetes Father   . Cancer Other        pancreatic  . Pancreatic cancer Maternal Grandmother   . Colon cancer Neg Hx   . Colon polyps Neg Hx   . Esophageal cancer Neg Hx   . Rectal cancer Neg Hx   . Stomach cancer Neg Hx     Social History   Socioeconomic History  . Marital status: Married    Spouse name: Not on file  . Number of children: Not on file  . Years of education: Not on file  . Highest education level: Not on file  Occupational History  . Not on file  Tobacco Use  . Smoking status: Current Every Day Smoker    Packs/day: 0.50    Years: 35.00    Pack years: 17.50    Types: Cigarettes  . Smokeless tobacco: Never Used  Substance and Sexual Activity  . Alcohol use: No    Alcohol/week: 0.0 standard drinks  . Drug use: No  . Sexual activity: Not on file  Other Topics Concern  . Not on  file  Social History Narrative  . Not on file   Social Determinants of Health   Financial Resource Strain:   . Difficulty of Paying Living Expenses: Not on file  Food Insecurity:   . Worried About Charity fundraiser in the Last Year: Not on file  . Ran Out of Food in the Last Year: Not on file  Transportation Needs:   . Lack of Transportation (Medical): Not on file  . Lack of Transportation (Non-Medical): Not on file  Physical Activity:   . Days of Exercise per Week: Not on file  . Minutes of Exercise per Session: Not on file  Stress:   . Feeling of Stress : Not on file  Social Connections:   . Frequency of Communication with Friends and Family: Not on file  . Frequency of Social Gatherings with Friends and Family: Not on file  . Attends Religious Services: Not on file  . Active Member of Clubs or Organizations: Not on file  . Attends Archivist Meetings: Not on file  .  Marital Status: Not on file  Intimate Partner Violence:   . Fear of Current or Ex-Partner: Not on file  . Emotionally Abused: Not on file  . Physically Abused: Not on file  . Sexually Abused: Not on file    Outpatient Medications Prior to Visit  Medication Sig Dispense Refill  . hydrochlorothiazide (HYDRODIURIL) 25 MG tablet Take 1 tablet (25 mg total) by mouth daily. 30 tablet 0   No facility-administered medications prior to visit.    Allergies  Allergen Reactions  . Penicillins Hives    REACTION: hives    Review of Systems     Objective:    Physical Exam Constitutional:      Appearance: She is well-developed.  HENT:     Head: Normocephalic and atraumatic.  Cardiovascular:     Rate and Rhythm: Normal rate and regular rhythm.     Heart sounds: Normal heart sounds.  Pulmonary:     Effort: Pulmonary effort is normal.     Breath sounds: Normal breath sounds.  Skin:    General: Skin is warm and dry.  Neurological:     Mental Status: She is alert and oriented to person, place, and time.  Psychiatric:        Behavior: Behavior normal.     Comments: She became tearful during the conversation today.     BP 131/69   Pulse 67   Ht 5\' 5"  (1.651 m)   Wt 177 lb (80.3 kg)   SpO2 99%   BMI 29.45 kg/m  Wt Readings from Last 3 Encounters:  02/25/19 177 lb (80.3 kg)  02/16/19 178 lb (80.7 kg)  02/04/19 180 lb (81.6 kg)    Health Maintenance Due  Topic Date Due  . Hepatitis C Screening  03/12/1955  . HIV Screening  07/06/1969  . PAP SMEAR-Modifier  02/26/2008  . MAMMOGRAM  01/24/2017    There are no preventive care reminders to display for this patient.   Lab Results  Component Value Date   TSH 2.547 09/13/2009   Lab Results  Component Value Date   WBC 6.9 01/21/2019   HGB 16.2 (H) 01/21/2019   HCT 46.0 (H) 01/21/2019   MCV 95.4 01/21/2019   PLT 332 01/21/2019   Lab Results  Component Value Date   NA 143 01/21/2019   K 4.4 01/21/2019   CO2 29  01/21/2019   GLUCOSE 97 01/21/2019   BUN 11 01/21/2019  CREATININE 0.84 01/21/2019   BILITOT 0.5 01/21/2019   ALKPHOS 120 10/18/2015   AST 18 01/21/2019   ALT 11 01/21/2019   PROT 6.7 01/21/2019   ALBUMIN 4.2 10/18/2015   CALCIUM 9.8 01/21/2019   Lab Results  Component Value Date   CHOL 220 (H) 10/12/2008   Lab Results  Component Value Date   HDL 60 10/12/2008   Lab Results  Component Value Date   LDLCALC 138 (H) 10/12/2008   Lab Results  Component Value Date   TRIG 109 10/12/2008   Lab Results  Component Value Date   CHOLHDL 3.7 Ratio 10/12/2008   No results found for: HGBA1C     Assessment & Plan:   Problem List Items Addressed This Visit      Cardiovascular and Mediastinum   Essential hypertension - Primary    Well controlled. Continue current regimen. Follow up in  6 mo       Other Visit Diagnoses    Need for tetanus, diphtheria, and acellular pertussis (Tdap) vaccine in patient of adolescent age or older       Relevant Orders   Tdap vaccine greater than or equal to 7yo IM (Completed)   Polyp of colon, unspecified part of colon, unspecified type       Stress          Abnormal colon Polyp-High-grade squamous lesion and rectal polyp-we discussed diagnosis and discussed that this is a precancerous lesion.  Did discuss the importance of meeting with the surgeon to discuss treatment options recovery risks etc.  She is very worried about it and again is just feeling a little bit overwhelmed.  Gave her reassurance to think about this in a really positive way and that we are catching something early which we can do something about instead of waiting and finding colon cancer later on.  Stress -will get right now.  Really trying to keep herself busy with housework and painting.  She tends to run the household and has been helping her 76 year old son who is also been really struggling with his senior year of high school.  Pressure to reach out if she is feeling  overwhelmed.  Time spent 30 minutes in encounter, including reviewing records and pathology report.  No orders of the defined types were placed in this encounter.    Beatrice Lecher, MD

## 2019-02-25 NOTE — Assessment & Plan Note (Signed)
Well controlled. Continue current regimen. Follow up in  6 mo  

## 2019-02-26 ENCOUNTER — Encounter: Payer: Self-pay | Admitting: Family Medicine

## 2019-02-26 ENCOUNTER — Other Ambulatory Visit: Payer: Self-pay | Admitting: Family Medicine

## 2019-03-17 ENCOUNTER — Telehealth: Payer: Self-pay | Admitting: Family Medicine

## 2019-03-17 DIAGNOSIS — Z1231 Encounter for screening mammogram for malignant neoplasm of breast: Secondary | ICD-10-CM

## 2019-03-17 NOTE — Telephone Encounter (Signed)
Please call patient and remind her that she is due for her mammogram.  She can actually go through her MyChart and pick her own appointment time if she would like or we can place an order and have the imaging department call her but just strongly encouraged her to get that scheduled as soon as she is able to.

## 2019-03-18 NOTE — Telephone Encounter (Signed)
Called patient- ok with [place] placing order.   Ordered for downstairs

## 2019-03-23 ENCOUNTER — Other Ambulatory Visit: Payer: Self-pay | Admitting: Family Medicine

## 2019-03-25 ENCOUNTER — Other Ambulatory Visit: Payer: Self-pay

## 2019-03-25 ENCOUNTER — Ambulatory Visit (INDEPENDENT_AMBULATORY_CARE_PROVIDER_SITE_OTHER): Payer: Managed Care, Other (non HMO)

## 2019-03-25 DIAGNOSIS — Z1231 Encounter for screening mammogram for malignant neoplasm of breast: Secondary | ICD-10-CM

## 2019-04-10 ENCOUNTER — Encounter: Payer: Self-pay | Admitting: Family Medicine

## 2019-06-28 ENCOUNTER — Encounter: Payer: Self-pay | Admitting: Family Medicine

## 2019-06-28 ENCOUNTER — Ambulatory Visit (INDEPENDENT_AMBULATORY_CARE_PROVIDER_SITE_OTHER): Payer: Managed Care, Other (non HMO) | Admitting: Family Medicine

## 2019-06-28 ENCOUNTER — Other Ambulatory Visit: Payer: Self-pay

## 2019-06-28 VITALS — BP 140/68 | HR 63 | Ht 65.0 in | Wt 176.0 lb

## 2019-06-28 DIAGNOSIS — S335XXD Sprain of ligaments of lumbar spine, subsequent encounter: Secondary | ICD-10-CM

## 2019-06-28 DIAGNOSIS — F439 Reaction to severe stress, unspecified: Secondary | ICD-10-CM | POA: Insufficient documentation

## 2019-06-28 DIAGNOSIS — M67449 Ganglion, unspecified hand: Secondary | ICD-10-CM

## 2019-06-28 DIAGNOSIS — F17200 Nicotine dependence, unspecified, uncomplicated: Secondary | ICD-10-CM | POA: Diagnosis not present

## 2019-06-28 DIAGNOSIS — J441 Chronic obstructive pulmonary disease with (acute) exacerbation: Secondary | ICD-10-CM

## 2019-06-28 MED ORDER — FLUOXETINE HCL 20 MG PO TABS: ORAL_TABLET | ORAL | 2 refills | Status: DC

## 2019-06-28 NOTE — Assessment & Plan Note (Signed)
On exam she has some coarse breath sounds in her right lung.  She has had a mild cough but says she does not feel ill.  Encouraged her to monitor if at any point she feels like she is becoming short of breath or develops new onset symptoms of please let us know immediately.

## 2019-06-28 NOTE — Telephone Encounter (Signed)
Error

## 2019-06-28 NOTE — Assessment & Plan Note (Signed)
He has been having more back pain recently and has been taking ibuprofen but she wants to do a physical at some point so she can check her kidney function etc.

## 2019-06-28 NOTE — Progress Notes (Signed)
R 4th finger. No injury or trauma.

## 2019-06-28 NOTE — Assessment & Plan Note (Signed)
Stress with a history of major depression as well.  Discussed treatment options she would like to restart fluoxetine.  New prescription sent to pharmacy she already has a follow-up in August so says she will keep that and we can make an adjustment then if needed. Several years ago she actually took 60 mg daily.

## 2019-06-28 NOTE — Progress Notes (Signed)
Established Patient Office Visit  Subjective:  Patient ID: Sherri [name], female    DOB: [birth year]  Age: 64 y.o. MRN: [id number]  CC:  Chief Complaint  Patient presents with  . Cyst    HPI Sherri [name] presents for cyst on the palmar side of her fifth digit on her right hand.  She says its been there for several months she said when she first noticed it was tiny but it has been getting larger and more recently has become sore.  Denies any injury or trauma.  She also reports that she has been having an increase in back pain and joint pain but says she will follow up on that at her next office visit she has been mostly relying on ibuprofen.  She says she also would like to consider starting Prozac back.  She said she took it several years ago and it worked well she just feels like her stress levels are really increased significantly and she is just not sleeping well.  Says her cousin recently passed away from [place].  She says this has been really difficult.  She and her husband did decide to go ahead and get Covid vaccines.    Past Medical History:  Diagnosis Date  . Anxiety   . Arthritis   . Asthma   . COPD (chronic obstructive pulmonary disease) ([place])   . Depression   . Pneumonia   . Recurrent upper respiratory infection (URI)     Past Surgical History:  Procedure Laterality Date  . ankle sx  1973  . APPENDECTOMY    . BREAST REDUCTION SURGERY  06/24/2011   Procedure: MAMMARY REDUCTION  (BREAST);  Surgeon: Cristine Polio, MD;  Location: [facility];  Service: Plastics;  Laterality: Bilateral;  . CERVICAL DISC SURGERY     (? laminectomy?)  . CESAREAN SECTION  1976  . left groin hernia  1974  . REDUCTION MAMMAPLASTY    . TONSILLECTOMY      Family History  Problem Relation Age of Onset  . Hypertension Mother   . Depression Mother   . Alzheimer's disease Mother   . Diverticulitis Mother   . Alcohol abuse Father   . Diabetes Father   . Cancer  Other        pancreatic  . Pancreatic cancer Maternal Grandmother   . Colon cancer Neg Hx   . Colon polyps Neg Hx   . Esophageal cancer Neg Hx   . Rectal cancer Neg Hx   . Stomach cancer Neg Hx     Social History   Socioeconomic History  . Marital status: Married    Spouse name: Not on file  . Number of children: Not on file  . Years of education: Not on file  . Highest education level: Not on file  Occupational History  . Not on file  Tobacco Use  . Smoking status: Current Every Day Smoker    Packs/day: 0.50    Years: 35.00    Pack years: 17.50    Types: Cigarettes  . Smokeless tobacco: Never Used  Vaping Use  . Vaping Use: Never used  Substance and Sexual Activity  . Alcohol use: No    Alcohol/week: 0.0 standard drinks  . Drug use: No  . Sexual activity: Not on file  Other Topics Concern  . Not on file  Social History Narrative  . Not on file   Social Determinants of Health   Financial Resource Strain:   . Difficulty  of Paying Living Expenses:   Food Insecurity:   . Worried About Charity fundraiser in the Last Year:   . Arboriculturist in the Last Year:   Transportation Needs:   . Film/video editor (Medical):   Marland Kitchen Lack of Transportation (Non-Medical):   Physical Activity:   . Days of Exercise per Week:   . Minutes of Exercise per Session:   Stress:   . Feeling of Stress :   Social Connections:   . Frequency of Communication with Friends and Family:   . Frequency of Social Gatherings with Friends and Family:   . Attends Religious Services:   . Active Member of Clubs or Organizations:   . Attends Archivist Meetings:   Marland Kitchen Marital Status:   Intimate Partner Violence:   . Fear of Current or Ex-Partner:   . Emotionally Abused:   Marland Kitchen Physically Abused:   . Sexually Abused:     Outpatient Medications Prior to Visit  Medication Sig Dispense Refill  . hydrochlorothiazide (HYDRODIURIL) 25 MG tablet TAKE 1 TABLET BY MOUTH EVERY DAY 90 tablet 1    No facility-administered medications prior to visit.    Allergies  Allergen Reactions  . Penicillins Hives    REACTION: hives    ROS Review of Systems    Objective:    Physical Exam Constitutional:      Appearance: She is well-developed.  HENT:     Head: Normocephalic and atraumatic.  Cardiovascular:     Rate and Rhythm: Normal rate and regular rhythm.     Heart sounds: Normal heart sounds.  Pulmonary:     Effort: Pulmonary effort is normal.     Breath sounds: Normal breath sounds.  Skin:    General: Skin is warm and dry.  Neurological:     Mental Status: She is alert and oriented to person, place, and time.  Psychiatric:        Behavior: Behavior normal.     BP 140/68   Pulse 63   Ht 5\' 5"  (1.651 m)   Wt 176 lb (79.8 kg)   SpO2 98%   BMI 29.29 kg/m  Wt Readings from Last 3 Encounters:  06/28/19 176 lb (79.8 kg)  02/25/19 177 lb (80.3 kg)  02/16/19 178 lb (80.7 kg)     Health Maintenance Due  Topic Date Due  . Hepatitis C Screening  Never done  . HIV Screening  Never done    There are no preventive care reminders to display for this patient.  Lab Results  Component Value Date   TSH 2.547 09/13/2009   Lab Results  Component Value Date   WBC 6.9 01/21/2019   HGB 16.2 (H) 01/21/2019   HCT 46.0 (H) 01/21/2019   MCV 95.4 01/21/2019   PLT 332 01/21/2019   Lab Results  Component Value Date   NA 143 01/21/2019   K 4.4 01/21/2019   CO2 29 01/21/2019   GLUCOSE 97 01/21/2019   BUN 11 01/21/2019   CREATININE 0.84 01/21/2019   BILITOT 0.5 01/21/2019   ALKPHOS 120 10/18/2015   AST 18 01/21/2019   ALT 11 01/21/2019   PROT 6.7 01/21/2019   ALBUMIN 4.2 10/18/2015   CALCIUM 9.8 01/21/2019   Lab Results  Component Value Date   CHOL 220 (H) 10/12/2008   Lab Results  Component Value Date   HDL 60 10/12/2008   Lab Results  Component Value Date   LDLCALC 138 (H) 10/12/2008   Lab Results  Component Value Date   TRIG 109 10/12/2008   Lab  Results  Component Value Date   CHOLHDL 3.7 Ratio 10/12/2008   No results found for: HGBA1C    Assessment & Plan:   Problem List Items Addressed This Visit      Respiratory   CHRONIC OBSTRUCTIVE PULMONARY DISEASE, ACUTE EXACERBATION    A little extra rhonchi in that right lung.  Again just encouraged her to monitor for any new or change in symptoms.  Do not see an active albuterol inhaler on her medication list.        Musculoskeletal and Integument   LUMBAR STRAIN    He has been having more back pain recently and has been taking ibuprofen but she wants to do a physical at some point so she can check her kidney function etc.        Other   TOBACCO DEPENDENCE    On exam she has some coarse breath sounds in her right lung.  She has had a mild cough but says she does not feel ill.  Encouraged her to monitor if at any point she feels like she is becoming short of breath or develops new onset symptoms of please let us know immediately.      Stress - Primary    Stress with a history of major depression as well.  Discussed treatment options she would like to restart fluoxetine.  New prescription sent to pharmacy she already has a follow-up in August so says she will keep that and we can make an adjustment then if needed. Several years ago she actually took 60 mg daily.       Other Visit Diagnoses    Digital mucinous cyst of finger          Cyst on her pinky finger most consistent with a mucinoid cyst.  Also consider synovial cyst.  We will get her in with Dr. Dianah Field our sports med doc for more definitive treatment.  Meds ordered this encounter  Medications  . FLUoxetine (PROZAC) 20 MG tablet    Sig: 1/2 tab po QD x 6 days then OK to increase to whole tab daily    Dispense:  30 tablet    Refill:  2    Follow-up: Return for Keep follow-up in August..    Beatrice Lecher, MD

## 2019-06-28 NOTE — Assessment & Plan Note (Signed)
A little extra rhonchi in that right lung.  Again just encouraged her to monitor for any new or change in symptoms.  Do not see an active albuterol inhaler on her medication list.

## 2019-07-05 ENCOUNTER — Ambulatory Visit: Payer: Managed Care, Other (non HMO) | Admitting: Sports Medicine

## 2019-07-12 ENCOUNTER — Other Ambulatory Visit: Payer: Self-pay

## 2019-07-12 ENCOUNTER — Encounter: Payer: Self-pay | Admitting: Sports Medicine

## 2019-07-12 ENCOUNTER — Ambulatory Visit (INDEPENDENT_AMBULATORY_CARE_PROVIDER_SITE_OTHER): Payer: Managed Care, Other (non HMO) | Admitting: Sports Medicine

## 2019-07-12 DIAGNOSIS — M67441 Ganglion, right hand: Secondary | ICD-10-CM | POA: Diagnosis not present

## 2019-07-12 NOTE — Progress Notes (Signed)
    Procedures performed today:    Procedure: Real-time Ultrasound Guided injection of the volar right fifth ganglion cyst Device: Samsung [id number]  Verbal informed consent obtained.  Time-out conducted.  Noted no overlying erythema, induration, or other signs of local infection.  Skin prepped in a sterile fashion.  Local anesthesia: Topical Ethyl chloride.  With sterile technique and under real time ultrasound guidance:  0.25 cc lidocaine, 0.5 cc Kenalog 40 injected easily.  Completed without difficulty  Pain immediately resolved suggesting accurate placement of the medication.  Advised to call if fevers/chills, erythema, induration, drainage, or persistent bleeding.  Images permanently stored and available for review in the ultrasound unit.  Impression: Technically successful ultrasound guided injection.  Independent interpretation of notes and tests performed by another provider:   None.  Brief History, Exam, Impression, and Recommendations:    Ganglion cyst of finger of right hand This pleasant 64 year old female has a ganglion cyst on her volar right fifth finger at the DIP. Today we performed an injection, fenestration. Return to see me in a month, if she does have a recurrence I will do an excision here in the office.    ___________________________________________ Gwen Her. Dianah Field, M.D., ABFM., CAQSM. Primary Care and [place] Instructor of [place] [facility] of Medicine

## 2019-07-12 NOTE — Assessment & Plan Note (Signed)
This pleasant 64 year old female has a ganglion cyst on her volar right fifth finger at the DIP. Today we performed an injection, fenestration. Return to see me in a month, if she does have a recurrence I will do an excision here in the office.

## 2019-07-22 ENCOUNTER — Other Ambulatory Visit: Payer: Self-pay | Admitting: Family Medicine

## 2019-08-09 ENCOUNTER — Other Ambulatory Visit: Payer: Self-pay

## 2019-08-09 ENCOUNTER — Encounter: Payer: Self-pay | Admitting: Sports Medicine

## 2019-08-09 ENCOUNTER — Ambulatory Visit (INDEPENDENT_AMBULATORY_CARE_PROVIDER_SITE_OTHER): Payer: Managed Care, Other (non HMO) | Admitting: Sports Medicine

## 2019-08-09 DIAGNOSIS — M67441 Ganglion, right hand: Secondary | ICD-10-CM

## 2019-08-09 NOTE — Assessment & Plan Note (Signed)
This is a very pleasant 64 year old female, 1 month ago we did an aspiration/injection, fenestration of a right fifth finger volarly pad cyst. There has been a recurrence, no improvement, on further questioning she only has a minimal amount of discomfort, she thinks of this as her "play toy," and frankly feels as though she can live with it so I would not recommend any further treatment. Should she desire further treatment I am happy to do an excision here in the office.

## 2019-08-09 NOTE — Progress Notes (Signed)
    Procedures performed today:    None.  Independent interpretation of notes and tests performed by another provider:   None.  Brief History, Exam, Impression, and Recommendations:    Cyst of finger of right hand This is a very pleasant 64 year old female, 1 month ago we did an aspiration/injection, fenestration of a right fifth finger volarly pad cyst. There has been a recurrence, no improvement, on further questioning she only has a minimal amount of discomfort, she thinks of this as her "play toy," and frankly feels as though she can live with it so I would not recommend any further treatment. Should she desire further treatment I am happy to do an excision here in the office.    ___________________________________________ Gwen Her. Dianah Field, M.D., ABFM., CAQSM. Primary Care and [place] Instructor of [place] [facility] of Medicine

## 2019-08-21 ENCOUNTER — Other Ambulatory Visit: Payer: Self-pay | Admitting: Family Medicine

## 2019-08-25 ENCOUNTER — Ambulatory Visit: Payer: Managed Care, Other (non HMO) | Admitting: Family Medicine

## 2019-09-02 ENCOUNTER — Encounter: Payer: Self-pay | Admitting: Family Medicine

## 2019-09-02 ENCOUNTER — Ambulatory Visit (INDEPENDENT_AMBULATORY_CARE_PROVIDER_SITE_OTHER): Payer: Managed Care, Other (non HMO) | Admitting: Family Medicine

## 2019-09-02 ENCOUNTER — Ambulatory Visit (INDEPENDENT_AMBULATORY_CARE_PROVIDER_SITE_OTHER): Payer: Managed Care, Other (non HMO)

## 2019-09-02 ENCOUNTER — Other Ambulatory Visit: Payer: Self-pay

## 2019-09-02 VITALS — BP 138/70 | HR 59 | Ht 65.0 in | Wt 171.0 lb

## 2019-09-02 DIAGNOSIS — M542 Cervicalgia: Secondary | ICD-10-CM | POA: Diagnosis not present

## 2019-09-02 DIAGNOSIS — F331 Major depressive disorder, recurrent, moderate: Secondary | ICD-10-CM

## 2019-09-02 DIAGNOSIS — M5412 Radiculopathy, cervical region: Secondary | ICD-10-CM | POA: Diagnosis not present

## 2019-09-02 DIAGNOSIS — F439 Reaction to severe stress, unspecified: Secondary | ICD-10-CM | POA: Diagnosis not present

## 2019-09-02 MED ORDER — FLUOXETINE HCL 40 MG PO CAPS: 40.0000 mg | ORAL_CAPSULE | Freq: Every day | ORAL | 1 refills | Status: DC

## 2019-09-02 NOTE — Progress Notes (Signed)
She would like to discuss increasing her dosage of fluoxetine

## 2019-09-02 NOTE — Assessment & Plan Note (Signed)
We will increase fluoxetine to 40 mg she is just feeling very stressed and overwhelmed right now.  She has been finding some coping strategies such as getting out in the garden or yard or just taking a break when she is feeling overwhelmed and says that has been helpful just encouraged her to continue to employ those strategies if she has any problems in the medication let me know otherwise I will see her back in 3 to 4 months.

## 2019-09-02 NOTE — Progress Notes (Signed)
Established Patient Office Visit  Subjective:  Patient ID: Sherri [name], female    DOB: [birth year]  Age: 64 y.o. MRN: [id number]  CC:  Chief Complaint  Patient presents with  . Hypertension    HPI Sherri [name] presents for f/U mood.  History of depression currently on fluoxetine 20 mg daily.  She says she is interested in increasing her dose today. She noticed that she still feeling a little bit on edge and a little bit irritable but definitely feels like the fluoxetine has been helpful.  PHQ9 SCORE ONLY 09/02/2019  PHQ-9 Total Score 3   She also complains of more neck pain. She had cervical disc surgery and has a plate in place that was done about 8 or 9 years ago by she believes Dr. Nils Pyle, neurosurgeon. More recently she has been getting some burning and aching sensation going from her neck down her back and up into her head. She has also noticed that she is getting a lot of aching in her arms especially at night she says in fact that is how she initially presented when she was having neck problems years ago before her surgery she says it is bad enough that it is waking her up. She is also getting some hand numbness again mostly at night. She has been using ibuprofen she has not tried heat or ice.  Past Medical History:  Diagnosis Date  . Anxiety   . Arthritis   . Asthma   . COPD (chronic obstructive pulmonary disease) ([place])   . Depression   . Pneumonia   . Recurrent upper respiratory infection (URI)     Past Surgical History:  Procedure Laterality Date  . ankle sx  1973  . APPENDECTOMY    . BREAST REDUCTION SURGERY  06/24/2011   Procedure: MAMMARY REDUCTION  (BREAST);  Surgeon: Cristine Polio, MD;  Location: [facility];  Service: Plastics;  Laterality: Bilateral;  . CERVICAL DISC SURGERY     (? laminectomy?)  . CESAREAN SECTION  1976  . left groin hernia  1974  . REDUCTION MAMMAPLASTY    . TONSILLECTOMY      Family History  Problem Relation  Age of Onset  . Hypertension Mother   . Depression Mother   . Alzheimer's disease Mother   . Diverticulitis Mother   . Alcohol abuse Father   . Diabetes Father   . Cancer Other        pancreatic  . Pancreatic cancer Maternal Grandmother   . Colon cancer Neg Hx   . Colon polyps Neg Hx   . Esophageal cancer Neg Hx   . Rectal cancer Neg Hx   . Stomach cancer Neg Hx     Social History   Socioeconomic History  . Marital status: Married    Spouse name: Not on file  . Number of children: Not on file  . Years of education: Not on file  . Highest education level: Not on file  Occupational History  . Not on file  Tobacco Use  . Smoking status: Current Every Day Smoker    Packs/day: 0.50    Years: 35.00    Pack years: 17.50    Types: Cigarettes  . Smokeless tobacco: Never Used  Vaping Use  . Vaping Use: Never used  Substance and Sexual Activity  . Alcohol use: No    Alcohol/week: 0.0 standard drinks  . Drug use: No  . Sexual activity: Not on file  Other Topics Concern  .  Not on file  Social History Narrative  . Not on file   Social Determinants of Health   Financial Resource Strain:   . Difficulty of Paying Living Expenses: Not on file  Food Insecurity:   . Worried About Charity fundraiser in the Last Year: Not on file  . Ran Out of Food in the Last Year: Not on file  Transportation Needs:   . Lack of Transportation (Medical): Not on file  . Lack of Transportation (Non-Medical): Not on file  Physical Activity:   . Days of Exercise per Week: Not on file  . Minutes of Exercise per Session: Not on file  Stress:   . Feeling of Stress : Not on file  Social Connections:   . Frequency of Communication with Friends and Family: Not on file  . Frequency of Social Gatherings with Friends and Family: Not on file  . Attends Religious Services: Not on file  . Active Member of Clubs or Organizations: Not on file  . Attends Archivist Meetings: Not on file  .  Marital Status: Not on file  Intimate Partner Violence:   . Fear of Current or Ex-Partner: Not on file  . Emotionally Abused: Not on file  . Physically Abused: Not on file  . Sexually Abused: Not on file    Outpatient Medications Prior to Visit  Medication Sig Dispense Refill  . hydrochlorothiazide (HYDRODIURIL) 25 MG tablet TAKE 1 TABLET BY MOUTH EVERY DAY 90 tablet 1  . FLUoxetine (PROZAC) 20 MG tablet Take 1 tablet (20 mg total) by mouth daily. 90 tablet 1   No facility-administered medications prior to visit.    Allergies  Allergen Reactions  . Hydrocodone Nausea And Vomiting  . Penicillins Hives    REACTION: hives    ROS Review of Systems    Objective:    Physical Exam Constitutional:      Appearance: She is well-developed.  HENT:     Head: Normocephalic and atraumatic.  Cardiovascular:     Rate and Rhythm: Normal rate and regular rhythm.     Heart sounds: Normal heart sounds.  Pulmonary:     Effort: Pulmonary effort is normal.     Breath sounds: Normal breath sounds.  Musculoskeletal:     Comments: Normal cervical flexion, extension.  She has slightly decreased rotation right and left is actually pretty symmetric.  Normal side bending.  Strength in the shoulders elbows and wrist 5 out of 5.  She has decreased internal rotation with her right shoulder as well as full external rotation but she says this is not new.  Skin:    General: Skin is warm and dry.  Neurological:     Mental Status: She is alert and oriented to person, place, and time.  Psychiatric:        Behavior: Behavior normal.     BP 138/70   Pulse (!) 59   Ht 5\' 5"  (1.651 m)   Wt 171 lb (77.6 kg)   SpO2 98%   BMI 28.46 kg/m  Wt Readings from Last 3 Encounters:  09/02/19 171 lb (77.6 kg)  06/28/19 176 lb (79.8 kg)  02/25/19 177 lb (80.3 kg)     Health Maintenance Due  Topic Date Due  . Hepatitis C Screening  Never done  . HIV Screening  Never done  . DEXA SCAN  Never done  . PNA  vac Low Risk Adult (1 of 2 - PCV13) Never done  . INFLUENZA VACCINE  08/15/2019    There are no preventive care reminders to display for this patient.  Lab Results  Component Value Date   TSH 2.547 09/13/2009   Lab Results  Component Value Date   WBC 6.9 01/21/2019   HGB 16.2 (H) 01/21/2019   HCT 46.0 (H) 01/21/2019   MCV 95.4 01/21/2019   PLT 332 01/21/2019   Lab Results  Component Value Date   NA 143 01/21/2019   K 4.4 01/21/2019   CO2 29 01/21/2019   GLUCOSE 97 01/21/2019   BUN 11 01/21/2019   CREATININE 0.84 01/21/2019   BILITOT 0.5 01/21/2019   ALKPHOS 120 10/18/2015   AST 18 01/21/2019   ALT 11 01/21/2019   PROT 6.7 01/21/2019   ALBUMIN 4.2 10/18/2015   CALCIUM 9.8 01/21/2019   Lab Results  Component Value Date   CHOL 220 (H) 10/12/2008   Lab Results  Component Value Date   HDL 60 10/12/2008   Lab Results  Component Value Date   LDLCALC 138 (H) 10/12/2008   Lab Results  Component Value Date   TRIG 109 10/12/2008   Lab Results  Component Value Date   CHOLHDL 3.7 Ratio 10/12/2008   No results found for: HGBA1C    Assessment & Plan:   Problem List Items Addressed This Visit      Other   Stress - Primary   MDD (major depressive disorder)    We will increase fluoxetine to 40 mg she is just feeling very stressed and overwhelmed right now.  She has been finding some coping strategies such as getting out in the garden or yard or just taking a break when she is feeling overwhelmed and says that has been helpful just encouraged her to continue to employ those strategies if she has any problems in the medication let me know otherwise I will see her back in 3 to 4 months.      Relevant Medications   FLUoxetine (PROZAC) 40 MG capsule    Other Visit Diagnoses    Cervical pain       Relevant Orders   DG Cervical Spine Complete   Cervical radiculopathy       Relevant Medications   FLUoxetine (PROZAC) 40 MG capsule     Cervical pain with  radiculopathy bilaterally-we will go ahead and get x-ray today just to make sure that her plate and screws are in place.  I suspect that will likely be normal.  If that is normal then consider referral back to the orthopedist for further work-up and evaluation.  For just pain I would normally recommend physical therapy but since she is getting some radicular symptoms causing some heaviness and pain as well as some numbness in the hands that I think further work-up would be recommended.   Meds ordered this encounter  Medications  . FLUoxetine (PROZAC) 40 MG capsule    Sig: Take 1 capsule (40 mg total) by mouth daily.    Dispense:  90 capsule    Refill:  1    Follow-up: Return in about 6 weeks (around 10/14/2019) for virtual visit for change in dose of medication  .   Time spent 83  Min in encounter.   Beatrice Lecher, MD

## 2019-09-09 ENCOUNTER — Encounter: Payer: Self-pay | Admitting: Family Medicine

## 2019-09-09 DIAGNOSIS — M542 Cervicalgia: Secondary | ICD-10-CM

## 2019-09-09 DIAGNOSIS — M5412 Radiculopathy, cervical region: Secondary | ICD-10-CM

## 2019-09-09 NOTE — Telephone Encounter (Signed)
PT order pended. Did you want her to see Dr. Darene Lamer or orthopedic MD?

## 2019-09-10 NOTE — Telephone Encounter (Signed)
Please schedule patient with Dr. Darene Lamer.

## 2019-09-14 NOTE — Telephone Encounter (Signed)
Appointment has been made. NO further questions at this time.

## 2019-09-16 ENCOUNTER — Other Ambulatory Visit: Payer: Self-pay | Admitting: Family Medicine

## 2019-09-29 ENCOUNTER — Ambulatory Visit (INDEPENDENT_AMBULATORY_CARE_PROVIDER_SITE_OTHER): Payer: Managed Care, Other (non HMO) | Admitting: Sports Medicine

## 2019-09-29 DIAGNOSIS — M542 Cervicalgia: Secondary | ICD-10-CM | POA: Diagnosis not present

## 2019-09-29 DIAGNOSIS — Z23 Encounter for immunization: Secondary | ICD-10-CM | POA: Diagnosis not present

## 2019-09-29 DIAGNOSIS — Q761 Klippel-Feil syndrome: Secondary | ICD-10-CM | POA: Diagnosis not present

## 2019-09-29 MED ORDER — MELOXICAM 15 MG PO TABS: ORAL_TABLET | ORAL | 3 refills | Status: DC

## 2019-09-29 MED ORDER — GABAPENTIN 300 MG PO CAPS: ORAL_CAPSULE | ORAL | 3 refills | Status: DC

## 2019-09-29 NOTE — Assessment & Plan Note (Signed)
This is a very pleasant 64 year old female with a history of a C4-C6 fusion in the distant past with Dr. Trenton Gammon. Unfortunately she starting to have a recurrence of pain in her neck, below the fusion level, radiating down the arms to the fingertips. 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 We will start conservatively with physical therapy, meloxicam, gabapentin at night. Return to see me in 4 to 6 weeks, we will either tweak her gabapentin dosing or proceed with MRI and intervention if no better.

## 2019-09-29 NOTE — Progress Notes (Signed)
    Procedures performed today:    None.  Independent interpretation of notes and tests performed by another provider:   X-rays personally reviewed, I see an uncomplicated A4-S9 ACDF with adjacent level disease proximal and distal.  Brief History, Exam, Impression, and Recommendations:    Cervical fusion syndrome This is a very pleasant 64 year old female with a history of a C4-C6 fusion in the distant past with Dr. Trenton Gammon. Unfortunately she starting to have a recurrence of pain in her neck, below the fusion level, radiating down the arms to the fingertips. 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 We will start conservatively with physical therapy, meloxicam, gabapentin at night. Return to see me in 4 to 6 weeks, we will either tweak her gabapentin dosing or proceed with MRI and intervention if no better.    ___________________________________________ Gwen Her. Dianah Field, M.D., ABFM., CAQSM. Primary Care and [place] Instructor of [place] of [facility] of Medicine

## 2019-10-21 ENCOUNTER — Telehealth (INDEPENDENT_AMBULATORY_CARE_PROVIDER_SITE_OTHER): Payer: Managed Care, Other (non HMO) | Admitting: Family Medicine

## 2019-10-21 ENCOUNTER — Encounter: Payer: Self-pay | Admitting: Family Medicine

## 2019-10-21 DIAGNOSIS — F331 Major depressive disorder, recurrent, moderate: Secondary | ICD-10-CM

## 2019-10-21 DIAGNOSIS — Q761 Klippel-Feil syndrome: Secondary | ICD-10-CM

## 2019-10-21 DIAGNOSIS — R0981 Nasal congestion: Secondary | ICD-10-CM

## 2019-10-21 MED ORDER — ORPHENADRINE CITRATE ER 100 MG PO TB12: 100.0000 mg | ORAL_TABLET | Freq: Two times a day (BID) | ORAL | 1 refills | Status: DC | PRN

## 2019-10-21 NOTE — Assessment & Plan Note (Signed)
Actually had a great response to the gabapentin and as needed meloxicam.  We will add Norflex for as needed use but did warn about sedation and especially mixing it with the gabapentin which is also sedative.

## 2019-10-21 NOTE — Progress Notes (Signed)
Doing well on current regimen .  

## 2019-10-21 NOTE — Progress Notes (Signed)
Sherri [name]  Established Patient Office Visit  Subjective:  Patient ID: Sherri [name], female    DOB: Dec 03, 1955  Age: 64 y.o. MRN: [id number]  CC:  Chief Complaint  Patient presents with  . mood    HPI Sherri [name] presents for 6-week follow-up for major depressive disorder.  We increased her fluoxetine to 40 mg she was just feeling very stressed and overwhelmed when I last saw her she was also having musculoskeletal issues that were causing significant discomfort and pain.  She says for now she wants to just take with the 40 mg she says she does feel like it is been helpful though she still struggling with a lot of tension in the house that she says she is dealing with some conflict with her grandson who is not wanting to get up and go to work and this just creates a lot of anxiety for her.  She says she tries to just walk away and and have some quiet time for herself and that does usually help.  PHQ9 SCORE ONLY 10/21/2019 09/02/2019  PHQ-9 Total Score 0 3   She was also recently started on gabapentin and meloxicam by Dr. Dianah Field, sports medicine for her neck pain status post cervical fusion.  She says is actually been really helpful she took the meloxicam for 2 weeks consistently and now uses it as needed and says the gabapentin has really helped with her sleep quality so she has been very happy with that.  She still struggles with a lot of tightness in her shoulders and neck and wonders if she would also benefit from a muscle relaxer.  Also reports sinus and chest congestion for about 3 to 4 days she has had bilateral facial cheek pressure.  No fevers, chills or sweats.  No sore throat.  She feels like it is more seasonal allergies she started taking Zyrtec and Flonase.  She is not been around anyone that is been sick in fact she says she has not left the house so she is pretty sure she does not have Covid.  Past Medical History:  Diagnosis Date  . Anxiety   . Arthritis   .  Asthma   . COPD (chronic obstructive pulmonary disease) ([place])   . Depression   . Pneumonia   . Recurrent upper respiratory infection (URI)     Past Surgical History:  Procedure Laterality Date  . ankle sx  1973  . APPENDECTOMY    . BREAST REDUCTION SURGERY  06/24/2011   Procedure: MAMMARY REDUCTION  (BREAST);  Surgeon: Cristine Polio, MD;  Location: [facility];  Service: Plastics;  Laterality: Bilateral;  . CERVICAL DISC SURGERY     (? laminectomy?)  . CESAREAN SECTION  1976  . left groin hernia  1974  . REDUCTION MAMMAPLASTY    . TONSILLECTOMY      Family History  Problem Relation Age of Onset  . Hypertension Mother   . Depression Mother   . Alzheimer's disease Mother   . Diverticulitis Mother   . Alcohol abuse Father   . Diabetes Father   . Cancer Other        pancreatic  . Pancreatic cancer Maternal Grandmother   . Colon cancer Neg Hx   . Colon polyps Neg Hx   . Esophageal cancer Neg Hx   . Rectal cancer Neg Hx   . Stomach cancer Neg Hx     Social History   Socioeconomic History  . Marital status: Married  Spouse name: Not on file  . Number of children: Not on file  . Years of education: Not on file  . Highest education level: Not on file  Occupational History  . Not on file  Tobacco Use  . Smoking status: Current Every Day Smoker    Packs/day: 0.50    Years: 35.00    Pack years: 17.50    Types: Cigarettes  . Smokeless tobacco: Never Used  Vaping Use  . Vaping Use: Never used  Substance and Sexual Activity  . Alcohol use: No    Alcohol/week: 0.0 standard drinks  . Drug use: No  . Sexual activity: Not on file  Other Topics Concern  . Not on file  Social History Narrative  . Not on file   Social Determinants of Health   Financial Resource Strain:   . Difficulty of Paying Living Expenses: Not on file  Food Insecurity:   . Worried About Charity fundraiser in the Last Year: Not on file  . Ran Out of Food in the Last Year: Not  on file  Transportation Needs:   . Lack of Transportation (Medical): Not on file  . Lack of Transportation (Non-Medical): Not on file  Physical Activity:   . Days of Exercise per Week: Not on file  . Minutes of Exercise per Session: Not on file  Stress:   . Feeling of Stress : Not on file  Social Connections:   . Frequency of Communication with Friends and Family: Not on file  . Frequency of Social Gatherings with Friends and Family: Not on file  . Attends Religious Services: Not on file  . Active Member of Clubs or Organizations: Not on file  . Attends Archivist Meetings: Not on file  . Marital Status: Not on file  Intimate Partner Violence:   . Fear of Current or Ex-Partner: Not on file  . Emotionally Abused: Not on file  . Physically Abused: Not on file  . Sexually Abused: Not on file    Outpatient Medications Prior to Visit  Medication Sig Dispense Refill  . FLUoxetine (PROZAC) 40 MG capsule Take 1 capsule (40 mg total) by mouth daily. 90 capsule 1  . gabapentin (NEURONTIN) 300 MG capsule 1 capsule p.o. nightly, may double if needed 30 capsule 3  . hydrochlorothiazide (HYDRODIURIL) 25 MG tablet TAKE 1 TABLET BY MOUTH EVERY DAY 90 tablet 1  . meloxicam (MOBIC) 15 MG tablet One tab PO qAM with a meal for 2 weeks, then daily prn pain. 30 tablet 3   No facility-administered medications prior to visit.    Allergies  Allergen Reactions  . Hydrocodone Nausea And Vomiting  . Penicillins Hives    REACTION: hives    ROS Review of Systems    Objective:    Physical Exam Vitals reviewed.  Constitutional:      Appearance: She is well-developed.  HENT:     Head: Normocephalic and atraumatic.  Eyes:     Conjunctiva/sclera: Conjunctivae normal.  Cardiovascular:     Rate and Rhythm: Normal rate.  Pulmonary:     Effort: Pulmonary effort is normal.  Skin:    General: Skin is dry.     Coloration: Skin is not pale.  Neurological:     Mental Status: She is alert  and oriented to person, place, and time.  Psychiatric:        Behavior: Behavior normal.     There were no vitals taken for this visit. Wt Readings from  Last 3 Encounters:  09/02/19 171 lb (77.6 kg)  06/28/19 176 lb (79.8 kg)  02/25/19 177 lb (80.3 kg)     Health Maintenance Due  Topic Date Due  . Hepatitis C Screening  Never done  . HIV Screening  Never done  . DEXA SCAN  Never done  . PNA vac Low Risk Adult (1 of 2 - PCV13) Never done    There are no preventive care reminders to display for this patient.  Lab Results  Component Value Date   TSH 2.547 09/13/2009   Lab Results  Component Value Date   WBC 6.9 01/21/2019   HGB 16.2 (H) 01/21/2019   HCT 46.0 (H) 01/21/2019   MCV 95.4 01/21/2019   PLT 332 01/21/2019   Lab Results  Component Value Date   NA 143 01/21/2019   K 4.4 01/21/2019   CO2 29 01/21/2019   GLUCOSE 97 01/21/2019   BUN 11 01/21/2019   CREATININE 0.84 01/21/2019   BILITOT 0.5 01/21/2019   ALKPHOS 120 10/18/2015   AST 18 01/21/2019   ALT 11 01/21/2019   PROT 6.7 01/21/2019   ALBUMIN 4.2 10/18/2015   CALCIUM 9.8 01/21/2019   Lab Results  Component Value Date   CHOL 220 (H) 10/12/2008   Lab Results  Component Value Date   HDL 60 10/12/2008   Lab Results  Component Value Date   LDLCALC 138 (H) 10/12/2008   Lab Results  Component Value Date   TRIG 109 10/12/2008   Lab Results  Component Value Date   CHOLHDL 3.7 Ratio 10/12/2008   No results found for: HGBA1C    Assessment & Plan:   Problem List Items Addressed This Visit      Musculoskeletal and Integument   Cervical fusion syndrome    Actually had a great response to the gabapentin and as needed meloxicam.  We will add Norflex for as needed use but did warn about sedation and especially mixing it with the gabapentin which is also sedative.        Other   MDD (major depressive disorder) - Primary    Continue with fluoxetine 40 mg daily she wants to just stay with this  regimen did discuss that we could always adjust it if needed.  Otherwise I will see her back in 3 to 4 months.       Other Visit Diagnoses    Sinus congestion         Sinus congestion-allergic versus viral.  Also consider COVID is a possibility even though she says she really does not leave the house but her grandson does live there and he does leave the house.  If she develops any worsening symptoms then I encouraged her to go get tested.  Otherwise continue with Zyrtec and Flonase and if not improving after the weekend please let us know.  Meds ordered this encounter  Medications  . orphenadrine (NORFLEX) 100 MG tablet    Sig: Take 1 tablet (100 mg total) by mouth 2 (two) times daily as needed for muscle spasms.    Dispense:  20 tablet    Refill:  1    Follow-up: No follow-ups on file.   Time spent in encounter 22 minutes  Beatrice Lecher, MD

## 2019-10-21 NOTE — Assessment & Plan Note (Signed)
Continue with fluoxetine 40 mg daily she wants to just stay with this regimen did discuss that we could always adjust it if needed.  Otherwise I will see her back in 3 to 4 months.

## 2019-11-10 ENCOUNTER — Ambulatory Visit: Payer: Managed Care, Other (non HMO) | Admitting: Sports Medicine

## 2019-11-17 ENCOUNTER — Ambulatory Visit (INDEPENDENT_AMBULATORY_CARE_PROVIDER_SITE_OTHER): Payer: Managed Care, Other (non HMO) | Admitting: Sports Medicine

## 2019-11-17 DIAGNOSIS — Q761 Klippel-Feil syndrome: Secondary | ICD-10-CM | POA: Diagnosis not present

## 2019-11-17 MED ORDER — GABAPENTIN 300 MG PO CAPS: ORAL_CAPSULE | ORAL | 3 refills | Status: DC

## 2019-11-17 NOTE — Assessment & Plan Note (Signed)
This pleasant 64 year old female returns, she has a C4-C6 fusion historically done by Dr. Trenton Gammon. She does have adjacent level disease, I added gabapentin, physical therapy at the last visit, she did not do any therapy due to insurance issues but gabapentin has been very helpful, only doing 300 mg at night, she will increase to 300 in the morning and 600 at night, with a steady up taper as needed, refilling this. I am also can give her some home rehab exercises, she is already 50% better, we can do a virtual visit in 4 to 6 weeks to reevaluate. The next step could certainly be MRI/epidural.

## 2019-11-17 NOTE — Progress Notes (Signed)
    Procedures performed today:    None.  Independent interpretation of notes and tests performed by another provider:   None.  Brief History, Exam, Impression, and Recommendations:    Cervical fusion syndrome This pleasant 64 year old female returns, she has a C4-C6 fusion historically done by Dr. Trenton Gammon. She does have adjacent level disease, I added gabapentin, physical therapy at the last visit, she did not do any therapy due to insurance issues but gabapentin has been very helpful, only doing 300 mg at night, she will increase to 300 in the morning and 600 at night, with a steady up taper as needed, refilling this. I am also can give her some home rehab exercises, she is already 50% better, we can do a virtual visit in 4 to 6 weeks to reevaluate. The next step could certainly be MRI/epidural.    ___________________________________________ Gwen Her. Dianah Field, M.D., ABFM., CAQSM. Primary Care and [place] Instructor of [place] of [facility] of Medicine

## 2019-12-15 ENCOUNTER — Telehealth: Payer: Managed Care, Other (non HMO) | Admitting: Sports Medicine

## 2020-01-05 ENCOUNTER — Encounter: Payer: Self-pay | Admitting: Family Medicine

## 2020-01-25 ENCOUNTER — Other Ambulatory Visit: Payer: Self-pay

## 2020-01-25 ENCOUNTER — Ambulatory Visit (INDEPENDENT_AMBULATORY_CARE_PROVIDER_SITE_OTHER): Payer: Managed Care, Other (non HMO) | Admitting: Sports Medicine

## 2020-01-25 DIAGNOSIS — Q761 Klippel-Feil syndrome: Secondary | ICD-10-CM | POA: Diagnosis not present

## 2020-01-25 DIAGNOSIS — M67441 Ganglion, right hand: Secondary | ICD-10-CM

## 2020-01-25 MED ORDER — TRIAZOLAM 0.25 MG PO TABS: ORAL_TABLET | ORAL | 0 refills | Status: DC

## 2020-01-25 NOTE — Progress Notes (Signed)
    Procedures performed today:    None.  Independent interpretation of notes and tests performed by another provider:   None.  Brief History, Exam, Impression, and Recommendations:    Cyst of finger of right hand This is a very pleasant 65 year old female, she had a recurrence of a cyst on the volar aspect of her right pinky, I do need to bring her back in a 30-minute slot for surgical excision of the cyst, we will also do triazolam for preprocedural anxiolysis, she will have a driver.  Cervical fusion syndrome History of a C4-C6 fusion by Dr. Trenton Gammon, adjacent level disease, gabapentin seems to be helping, currently doing 600 mg in the daytime and 900 at night, going up to 900 twice daily. Happy to refill this when she needs. We can certainly consider an epidural in the future should she not have sufficient relief.    ___________________________________________ Gwen Her. Dianah Field, M.D., ABFM., CAQSM. Primary Care and [place] Instructor of [place] of [facility] of Medicine

## 2020-01-25 NOTE — Assessment & Plan Note (Signed)
History of a C4-C6 fusion by Dr. Trenton Gammon, adjacent level disease, gabapentin seems to be helping, currently doing 600 mg in the daytime and 900 at night, going up to 900 twice daily. Happy to refill this when she needs. We can certainly consider an epidural in the future should she not have sufficient relief.

## 2020-01-25 NOTE — Assessment & Plan Note (Signed)
This is a very pleasant 65 year old female, she had a recurrence of a cyst on the volar aspect of her right pinky, I do need to bring her back in a 30-minute slot for surgical excision of the cyst, we will also do triazolam for preprocedural anxiolysis, she will have a driver.

## 2020-02-01 ENCOUNTER — Other Ambulatory Visit: Payer: Self-pay | Admitting: Sports Medicine

## 2020-02-01 DIAGNOSIS — Q761 Klippel-Feil syndrome: Secondary | ICD-10-CM

## 2020-02-02 ENCOUNTER — Other Ambulatory Visit: Payer: Self-pay

## 2020-02-02 ENCOUNTER — Ambulatory Visit (INDEPENDENT_AMBULATORY_CARE_PROVIDER_SITE_OTHER): Payer: Managed Care, Other (non HMO) | Admitting: Sports Medicine

## 2020-02-02 DIAGNOSIS — M67441 Ganglion, right hand: Secondary | ICD-10-CM | POA: Diagnosis not present

## 2020-02-02 MED ORDER — OXYCODONE-ACETAMINOPHEN 5-325 MG PO TABS: 1.0000 | ORAL_TABLET | Freq: Three times a day (TID) | ORAL | 0 refills | Status: DC | PRN

## 2020-02-02 NOTE — Patient Instructions (Signed)
Incision Care, Adult An incision is a surgical cut that is made through your skin. Most incisions are closed after a surgical procedure. Your incision may be closed with stitches (sutures), staples, skin glue, or adhesive strips. You may need to return to your health care provider to have sutures or staples removed. This may occur several days or several weeks after your surgery. Until then, the incision needs to be cared for properly to prevent infection. Follow instructions from your health care provider about how to care for your incision. Supplies needed:  Soap, water, and a clean hand towel.  Wound cleanser.  A clean bandage (dressing), if needed.  Cream or ointment, if told by your health care provider.  Clean gauze. How to care for your incision Cleaning the incision Ask your health care provider how to clean the incision. This may include:  Using mild soap and water, or wound cleanser.  Using a clean gauze to pat the incision dry after cleaning it. Dressing changes  Wash your hands with soap and water for at least 20 seconds before and after you change the dressing. If soap and water are not available, use hand sanitizer.  Change your dressing as told by your health care provider.  Leave sutures, staples, skin glue, or adhesive strips in place. These skin closures may need to stay in place for 2 weeks or longer. If adhesive strip edges start to loosen and curl up, you may trim the loose edges. Do not remove adhesive strips completely unless your health care provider tells you to do that.  Apply cream or ointment. Do this only as told by your health care provider.  Cover the incision with a clean dressing. Ask your health care provider when you can begin leaving the incision uncovered. Checking for infection Check your incision area every day for signs of infection. Check for:  More redness, swelling, or pain.  More fluid or blood.  Warmth.  Pus or a bad smell.    Follow these instructions at home Medicines  Take over-the-counter and prescription medicines only as told by your health care provider.  If you were prescribed an antibiotic medicine, cream, or ointment, take or apply it as told by your health care provider. Do not stop using the antibiotic even if your condition improves. Eating and drinking  Eat a diet that includes protein, vitamin A, vitamin C, and other nutrient-rich foods to help the wound heal. ? Foods rich in protein include meat, fish, eggs, dairy, beans, and nuts. ? Foods rich in vitamin A include carrots and dark green, leafy vegetables. ? Foods rich in vitamin C include citrus fruits, tomatoes, broccoli, and peppers.  Drink enough fluid to keep your urine pale yellow. General instructions  Do not take baths, swim, use a hot tub, or do anything that would put the incision underwater until your health care provider approves. Ask your health care provider if you may take showers. You may only be allowed to take sponge baths.  Limit movement around your incision to promote healing. ? Avoid straining, lifting, or exercising for the first 2 weeks after your procedure, or for as long as told by your health care provider. ? Return to your normal activities as told by your health care provider. Ask your health care provider what activities are safe for you.  Do not scratch or pick at the incision. Keep it covered as told by your health care provider.  Protect your incision from the sun when you are   outside for the first 6 months, or for as long as told by your health care provider. Cover up the scar area or apply sunscreen that has an SPF of at least 30.  Do not use any products that contain nicotine or tobacco, such as cigarettes, e-cigarettes, and chewing tobacco. These can delay incision healing after surgery. If you need help quitting, ask your health care provider.  Keep all follow-up visits as told by your health care  provider. This is important.   Contact a health care provider if:  You have any of these signs of infection: ? More redness, swelling, or pain around your incision. ? More fluid or blood coming from your incision. ? Warmth coming from your incision. ? Pus or a bad smell coming from your incision. ? A fever.  You are nauseous or you vomit.  You are dizzy.  Your sutures, staples, skin glue, or adhesive strips come undone. Get help right away if:  You have a red streak on the skin near your incision.  Your incision bleeds through the dressing and the bleeding does not stop with gentle pressure.  The edges of your incision open up and separate.  You have signs of a serious bodily reaction to an infection. These signs may include: ? Fever, shaking chills, or feeling very cold. ? Confusion or anxiety. ? Severe pain. ? Trouble breathing. ? Fast heartbeat. ? Clammy or sweaty skin. ? A rash. These symptoms may represent a serious problem that is an emergency. Do not wait to see if the symptoms will go away. Get medical help right away. Call your local emergency services (911 in the [place] Do not drive yourself to the hospital. Summary  Follow instructions from your health care provider about how to care for your incision.  Wash your hands with soap and water for at least 20 seconds before and after you change the dressing. If soap and water are not available, use hand sanitizer.  Check your incision area every day for signs of infection.  Keep all follow-up visits as told by your health care provider. This is important. This information is not intended to replace advice given to you by your health care provider. Make sure you discuss any questions you have with your health care provider. Document Revised: 10/21/2018 Document Reviewed: 10/21/2018 Elsevier Patient Education  2021 Elsevier Inc.   

## 2020-02-02 NOTE — Assessment & Plan Note (Addendum)
Surgical excision of the cyst as above with primary closure. Oxycodone for postoperative pain. Return to see me in a week for suture removal/wound check.

## 2020-02-02 NOTE — Progress Notes (Signed)
    Procedures performed today:    Procedure:  Excision of right fifth finger volar subcutaneous tumor, 1.2 cm across. Risks, benefits, and alternatives explained and consent obtained. Time out conducted. Surface prepped with alcohol. A total of 5 cc lidocaine without epinephrine was used to perform a digital block Adequate anesthesia ensured. Area prepped and draped in a sterile fashion. Excision performed with: Using a #11 blade I made a linear incision, then using blunt dissection I was able to remove the cyst en bloc, the incision was then closed with #4 5-0 simple interrupted Vicryl sutures. Hemostasis achieved. Pt stable.  Independent interpretation of notes and tests performed by another provider:   None.  Brief History, Exam, Impression, and Recommendations:    Cyst of finger of right hand Surgical excision of the cyst as above with primary closure. Oxycodone for postoperative pain. Return to see me in a week for suture removal/wound check.    ___________________________________________ Gwen Her. Dianah Field, M.D., ABFM., CAQSM. Primary Care and [employer] Instructor of [place] of [facility] of Medicine

## 2020-02-09 ENCOUNTER — Ambulatory Visit: Payer: Managed Care, Other (non HMO) | Admitting: Sports Medicine

## 2020-02-09 ENCOUNTER — Telehealth: Payer: Self-pay

## 2020-02-09 NOTE — Telephone Encounter (Signed)
Should be quick, Thursday or Friday first thing in the morning will be fine.  How is she doing?

## 2020-02-09 NOTE — Telephone Encounter (Signed)
Patient was scheduled with you today for a wound check and to get her stitches out. She is currently at the [facility] home making arrangements for her mother. She is requesting that we work her in sometime before your next available which is next Tuesday per patient. Please advise.

## 2020-02-09 NOTE — Telephone Encounter (Signed)
Pt scheduled for 8:15 tomorrow

## 2020-02-09 NOTE — Telephone Encounter (Signed)
Please call patient and get her scheduled as below.

## 2020-02-11 ENCOUNTER — Ambulatory Visit (INDEPENDENT_AMBULATORY_CARE_PROVIDER_SITE_OTHER): Payer: Managed Care, Other (non HMO) | Admitting: Sports Medicine

## 2020-02-11 ENCOUNTER — Other Ambulatory Visit: Payer: Self-pay

## 2020-02-11 ENCOUNTER — Encounter: Payer: Self-pay | Admitting: Sports Medicine

## 2020-02-11 DIAGNOSIS — M67441 Ganglion, right hand: Secondary | ICD-10-CM

## 2020-02-11 NOTE — Assessment & Plan Note (Addendum)
Sherri [name] returns, she is a pleasant 65 year old female, I performed a surgical excision of a sebaceous cyst in the pad of her finger, she returns today 9 days postop, doing well, sutures removed, Dermabond applied. She has sensation at the tip of her finger albeit a little tingly, return to see me in a month for a final wound check.

## 2020-02-11 NOTE — Progress Notes (Signed)
    Procedures performed today:    None.  Independent interpretation of notes and tests performed by another provider:   None.  Brief History, Exam, Impression, and Recommendations:    Cyst of finger of right hand Sherri [name] returns, she is a pleasant 65 year old female, I performed a surgical excision of a sebaceous cyst in the pad of [name] finger, she returns today 9 days postop, doing well, sutures removed, Dermabond applied. She has sensation at the tip of [name] finger albeit a little tingly, return to see me in a month for a final wound check.    ___________________________________________ Sherri [name]. Dianah Field, M.D., ABFM., CAQSM. Primary Care and [place] Instructor of [place] of [facility] of Medicine

## 2020-03-01 ENCOUNTER — Other Ambulatory Visit: Payer: Self-pay | Admitting: Family Medicine

## 2020-03-09 ENCOUNTER — Telehealth (INDEPENDENT_AMBULATORY_CARE_PROVIDER_SITE_OTHER): Payer: Managed Care, Other (non HMO) | Admitting: Family Medicine

## 2020-03-09 ENCOUNTER — Encounter: Payer: Self-pay | Admitting: Family Medicine

## 2020-03-09 DIAGNOSIS — J019 Acute sinusitis, unspecified: Secondary | ICD-10-CM

## 2020-03-09 MED ORDER — DOXYCYCLINE HYCLATE 100 MG PO TABS: 100.0000 mg | ORAL_TABLET | Freq: Two times a day (BID) | ORAL | 0 refills | Status: DC

## 2020-03-09 NOTE — Progress Notes (Signed)
Pt reports that she hs has a headache x 1 wk, then she began to cough. She took a covid test 1 day ago this was negative.  She has had f/s/c. She has been taking IBU. She stated that her cough is productive it has a beige yellow color. She has facial pain and pressure.

## 2020-03-09 NOTE — Progress Notes (Signed)
Virtual Visit via Video Note  I connected with Sherri [name] on 03/09/20 at  1:40 PM EST by a video enabled telemedicine application and verified that I am speaking with the correct person using two identifiers.   I discussed the limitations of evaluation and management by telemedicine and the availability of in person appointments. The patient expressed understanding and agreed to proceed.  Patient location: at home.   Provider location: in office  Subjective:    CC: nasal congestion, cough.   HPI: Started a week ago with bilat ear pain and now having nasal congestion, productive cough and chills and sweats. Had a negative COVID test.  No fever.  + sob. Ears still hurt but not as bad.  No drainage. HA is daily, worse in the afternoon.  Sputum is beige. Chest is uncomfortable.    Past medical history, Surgical history, Family history not pertinant except as noted below, Social history, Allergies, and medications have been entered into the medical record, reviewed, and corrections made.   Review of Systems: No fevers, chills, night sweats, weight loss, chest pain, or shortness of breath.   Objective:    General: Speaking clearly in complete sentences without any shortness of breath.  Alert and oriented x3.  Normal judgment. No apparent acute distress.    Impression and Recommendations:    No problem-specific Assessment & Plan notes found for this encounter.  Acute sinusitis  -did discuss with her that even though she is had a negative Covid test this still could be viral but she has had symptoms for 1 week at this point.  We will go ahead and treat with doxycycline since she has a penicillin allergy.  Call back if not better after the weekend.  If she is develops worsening chest symptoms such as shortness of breath or chest pressure then please let us know so we can consider evaluation with chest x-ray if needed.    Time spent in encounter 20 minutes.  I discussed the  assessment and treatment plan with the patient. The patient was provided an opportunity to ask questions and all were answered. The patient agreed with the plan and demonstrated an understanding of the instructions.   The patient was advised to call back or seek an in-person evaluation if the symptoms worsen or if the condition fails to improve as anticipated.   Beatrice Lecher, MD

## 2020-03-10 ENCOUNTER — Ambulatory Visit: Payer: Managed Care, Other (non HMO) | Admitting: Sports Medicine

## 2020-03-16 ENCOUNTER — Other Ambulatory Visit: Payer: Self-pay

## 2020-03-16 ENCOUNTER — Ambulatory Visit (INDEPENDENT_AMBULATORY_CARE_PROVIDER_SITE_OTHER): Payer: Managed Care, Other (non HMO) | Admitting: Sports Medicine

## 2020-03-16 DIAGNOSIS — M67441 Ganglion, right hand: Secondary | ICD-10-CM

## 2020-03-16 NOTE — Progress Notes (Signed)
    Procedures performed today:    None.  Independent interpretation of notes and tests performed by another provider:   None.  Brief History, Exam, Impression, and Recommendations:    Cyst of finger of right hand This pleasant 65 year old female returns, she had a surgical excision of a sebaceous cyst from the pad of [name] finger, she is doing extremely well, she has what appears to be a infinitesimal piece of scar or granuloma, this will resolve over time, the incision looks fantastic, she has good sensation, return as needed.    ___________________________________________ Sherri [name]. Dianah Field, M.D., ABFM., CAQSM. Primary Care and [place] Instructor of [place] of [facility] of Medicine

## 2020-03-16 NOTE — Assessment & Plan Note (Signed)
This pleasant 65 year old female returns, she had a surgical excision of a sebaceous cyst from the pad of her finger, she is doing extremely well, she has what appears to be a infinitesimal piece of scar or granuloma, this will resolve over time, the incision looks fantastic, she has good sensation, return as needed.

## 2020-03-30 ENCOUNTER — Other Ambulatory Visit: Payer: Self-pay | Admitting: Family Medicine

## 2020-04-10 ENCOUNTER — Telehealth (INDEPENDENT_AMBULATORY_CARE_PROVIDER_SITE_OTHER): Payer: Managed Care, Other (non HMO) | Admitting: Physician Assistant

## 2020-04-10 VITALS — Temp 97.6°F | Ht 65.0 in | Wt 155.0 lb

## 2020-04-10 DIAGNOSIS — J440 Chronic obstructive pulmonary disease with acute lower respiratory infection: Secondary | ICD-10-CM | POA: Diagnosis not present

## 2020-04-10 DIAGNOSIS — F17200 Nicotine dependence, unspecified, uncomplicated: Secondary | ICD-10-CM

## 2020-04-10 DIAGNOSIS — J209 Acute bronchitis, unspecified: Secondary | ICD-10-CM | POA: Diagnosis not present

## 2020-04-10 MED ORDER — PREDNISONE 20 MG PO TABS: ORAL_TABLET | ORAL | 0 refills | Status: DC

## 2020-04-10 NOTE — Progress Notes (Signed)
Patient ID: Sherri [name], female   DOB: 07/19/1955, 65 y.o.   MRN: [id number] .Marland KitchenVirtual Visit via Video Note  I connected with [place] on 04/10/2020 at  4:20 PM EDT by a video enabled telemedicine application and verified that I am speaking with the correct person using two identifiers.  Location: Patient: home Provider: clinic  .Marland KitchenParticipating in visit:  Patient: Sherri [name]  Provider: Iran Planas PA-C   I discussed the limitations of evaluation and management by telemedicine and the availability of in person appointments. The patient expressed understanding and agreed to proceed.  History of Present Illness: Patient is a 65 year old female who calls into the clinic with productive cough for the last 4 to 5 days.  She recently had sinusitis on 2/24 and given doxycycline.  She finished that and has been out of it for 2 weeks.  Her sinusitis symptoms have resolved.  She is now left with a productive cough.  Delsym is helping some mostly at bedtime.  She also has a sore throat and some nasal congestion.  She denies any fever, chills, body aches, shortness of breath, GI symptoms.  She is taking Zyrtec and Flonase daily for allergies.  She continues to smoke at least a pack a day.  Mucinex has helped in the past but not taking currently.  She does not have a diagnosis of COPD and not on any inhalers for daily shortness of breath.  Not covid tested this episode of symptoms.  .. Active Ambulatory Problems    Diagnosis Date Noted  . MDD (major depressive disorder) 10/22/2005  . Panic attack 10/22/2005  . TOBACCO DEPENDENCE 10/22/2005  . UNSPECIFIED VENOUS INSUFFICIENCY 09/13/2009  . ALLERGIC RHINITIS 03/21/2008  . CHRONIC OBSTRUCTIVE PULMONARY DISEASE, ACUTE EXACERBATION 06/15/2009  . OSTEOARTHRITIS, UNSPECIFIED 10/22/2005  . FATIGUE 10/11/2008  . LUMBAR STRAIN 09/13/2009  . 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 Chronic diarrhea 01/21/2019  . Essential hypertension  02/25/2019  . Stress 06/28/2019  . Cyst of finger of right hand 07/12/2019  . Cervical fusion syndrome 09/29/2019  . Acute bronchitis with COPD ([place]) 04/10/2020   Resolved Ambulatory Problems    Diagnosis Date Noted  . Cellulitis of breast 06/09/2015  . Atypical chest pain 10/18/2015   Past Medical History:  Diagnosis Date  . Anxiety   . Arthritis   . Asthma   . COPD (chronic obstructive pulmonary disease) ([place])   . Depression   . Pneumonia   . Recurrent upper respiratory infection (URI)    Reviewed med, allergy, problem list.     Observations/Objective: No acute distress Normal mood and appearance. Productive cough. No labored breathing or wheezing.   .. Today's Vitals   04/10/20 1608  Temp: 97.6 F (36.4 C)  TempSrc: Oral  Weight: 155 lb (70.3 kg)  Height: 5\' 5"  (1.651 m)   Body mass index is 25.79 kg/m.    Assessment and Plan: Marland KitchenMarland KitchenTonjia was seen today for cough.  Diagnoses and all orders for this visit:  Acute bronchitis with COPD ([place]) -     predniSONE (DELTASONE) 20 MG tablet; Take 3 tablets for 3 days take 1 tablet for 3 days, take 1/2 tablet for 3 days, take 1/2 tablet for 4 days.   covid vaccine x2.  Encouraged to test via rapid at home.  Finished antibiotic 2 weeks ago. I think this is more allergic or viral.  Continue zyrtec and flonase.  Use albuterol inhaler every 4-6 hours for cough and start prednisone.  Continue on mucinex.  Pt continues to smoke and not interested in quitting.     Follow Up Instructions:    I discussed the assessment and treatment plan with the patient. The patient was provided an opportunity to ask questions and all were answered. The patient agreed with the plan and demonstrated an understanding of the instructions.   The patient was advised to call back or seek an in-person evaluation if the symptoms worsen or if the condition fails to improve as anticipated.    Iran Planas, PA-C

## 2020-04-10 NOTE — Progress Notes (Signed)
Started over the weekend Cough Ear pain (both) Throat feels raw Congestion  No headache, no abdominal pain/nausea/vomiting No muscle aches/chills/fever No sick contacts   Has tried Zyrtec, flonase, delsym

## 2020-04-11 ENCOUNTER — Encounter: Payer: Self-pay | Admitting: Physician Assistant

## 2020-06-07 ENCOUNTER — Encounter: Payer: Self-pay | Admitting: Family Medicine

## 2020-06-07 ENCOUNTER — Other Ambulatory Visit: Payer: Self-pay

## 2020-06-07 ENCOUNTER — Ambulatory Visit (INDEPENDENT_AMBULATORY_CARE_PROVIDER_SITE_OTHER): Payer: Managed Care, Other (non HMO) | Admitting: Family Medicine

## 2020-06-07 ENCOUNTER — Ambulatory Visit (INDEPENDENT_AMBULATORY_CARE_PROVIDER_SITE_OTHER): Payer: Managed Care, Other (non HMO)

## 2020-06-07 VITALS — BP 134/64 | HR 51 | Ht 65.0 in | Wt 158.0 lb

## 2020-06-07 DIAGNOSIS — J441 Chronic obstructive pulmonary disease with (acute) exacerbation: Secondary | ICD-10-CM

## 2020-06-07 DIAGNOSIS — R059 Cough, unspecified: Secondary | ICD-10-CM

## 2020-06-07 DIAGNOSIS — I10 Essential (primary) hypertension: Secondary | ICD-10-CM

## 2020-06-07 DIAGNOSIS — J439 Emphysema, unspecified: Secondary | ICD-10-CM

## 2020-06-07 DIAGNOSIS — R634 Abnormal weight loss: Secondary | ICD-10-CM

## 2020-06-07 DIAGNOSIS — F331 Major depressive disorder, recurrent, moderate: Secondary | ICD-10-CM

## 2020-06-07 DIAGNOSIS — Z23 Encounter for immunization: Secondary | ICD-10-CM

## 2020-06-07 MED ORDER — FLUOXETINE HCL 60 MG PO TABS: 60.0000 mg | ORAL_TABLET | Freq: Every day | ORAL | 1 refills | Status: DC

## 2020-06-07 MED ORDER — ALBUTEROL SULFATE HFA 108 (90 BASE) MCG/ACT IN AERS: 2.0000 | INHALATION_SPRAY | Freq: Four times a day (QID) | RESPIRATORY_TRACT | 0 refills | Status: DC | PRN

## 2020-06-07 NOTE — Assessment & Plan Note (Signed)
Increase fluoxetine to 60 mg she is actually been on this dose previously is just been sometime.  We also discussed ways to reduce her stress levels offered counseling/therapy encouraged her to think about it.  Otherwise follow-up in 3 to 4 months.

## 2020-06-07 NOTE — Progress Notes (Signed)
Established Patient Office Visit  Subjective:  Patient ID: Sherri [name], female    DOB: 21-Apr-1955  Age: 65 y.o. MRN: [id number]  CC:  Chief Complaint  Patient presents with  . unintentional weight loss    HPI Sherri [name] presents for unintentional weight loss.  She has gone down in bra size.  Is lost about 20 pounds in the last year.  Her family members have noticed.  But she also reports because of her decreased appetite she is only eating about once a day.  She has a lot of sensitivities to certain foods.  She feels like her fluoxetine needs to be increased. She used to be on 60mg .  Has been more stressed and she is on fluoxetine 40mg ,.  She has had a dec appetite.    Cough-also reports that she has had a persistent cough ever since she was treated for sinus infection in February.  She does have a history of underlying COPD.  Fever or chills.  No rash.  Cough is still productive even though she feels better overall.  She does have a history of underlying COPD not currently on any inhalers.  Hypertension- Pt denies chest pain, SOB, dizziness, or heart palpitations.  Taking meds as directed w/o problems.  Denies medication side effects.      Past Medical History:  Diagnosis Date  . Anxiety   . Arthritis   . Asthma   . COPD (chronic obstructive pulmonary disease) ([place])   . Depression   . Pneumonia   . Recurrent upper respiratory infection (URI)     Past Surgical History:  Procedure Laterality Date  . ankle sx  1973  . APPENDECTOMY    . BREAST REDUCTION SURGERY  06/24/2011   Procedure: MAMMARY REDUCTION  (BREAST);  Surgeon: Cristine Polio, MD;  Location: [facility];  Service: Plastics;  Laterality: Bilateral;  . CERVICAL DISC SURGERY     (? laminectomy?)  . CESAREAN SECTION  1976  . left groin hernia  1974  . REDUCTION MAMMAPLASTY    . TONSILLECTOMY      Family History  Problem Relation Age of Onset  . Hypertension Mother   . Depression  Mother   . Alzheimer's disease Mother   . Diverticulitis Mother   . Alcohol abuse Father   . Diabetes Father   . Cancer Other        pancreatic  . Pancreatic cancer Maternal Grandmother   . Colon cancer Neg Hx   . Colon polyps Neg Hx   . Esophageal cancer Neg Hx   . Rectal cancer Neg Hx   . Stomach cancer Neg Hx     Social History   Socioeconomic History  . Marital status: Married    Spouse name: Not on file  . Number of children: Not on file  . Years of education: Not on file  . Highest education level: Not on file  Occupational History  . Not on file  Tobacco Use  . Smoking status: Current Every Day Smoker    Packs/day: 0.50    Years: 35.00    Pack years: 17.50    Types: Cigarettes  . Smokeless tobacco: Never Used  Vaping Use  . Vaping Use: Never used  Substance and Sexual Activity  . Alcohol use: No    Alcohol/week: 0.0 standard drinks  . Drug use: No  . Sexual activity: Not on file  Other Topics Concern  . Not on file  Social History Narrative  .  Not on file   Social Determinants of Health   Financial Resource Strain: Not on file  Food Insecurity: Not on file  Transportation Needs: Not on file  Physical Activity: Not on file  Stress: Not on file  Social Connections: Not on file  Intimate Partner Violence: Not on file    Outpatient Medications Prior to Visit  Medication Sig Dispense Refill  . gabapentin (NEURONTIN) 300 MG capsule 1 capsule in the morning, 2 capsules at night.  May increase to 2 capsules in the morning and 3 capsules at night if needed (Patient taking differently: 2 capsules in the morning and 3 capsules at night) 180 capsule 3  . hydrochlorothiazide (HYDRODIURIL) 25 MG tablet TAKE 1 TABLET BY MOUTH EVERY DAY 90 tablet 1  . FLUoxetine (PROZAC) 40 MG capsule TAKE 1 CAPSULE BY MOUTH EVERY DAY 90 capsule 1  . predniSONE (DELTASONE) 20 MG tablet Take 3 tablets for 3 days take 1 tablet for 3 days, take 1/2 tablet for 3 days, take 1/2 tablet  for 4 days. 20 tablet 0   No facility-administered medications prior to visit.    Allergies  Allergen Reactions  . Hydrocodone Nausea And Vomiting  . Penicillins Hives    REACTION: hives    ROS Review of Systems    Objective:    Physical Exam Constitutional:      Appearance: She is well-developed.  HENT:     Head: Normocephalic and atraumatic.  Cardiovascular:     Rate and Rhythm: Normal rate and regular rhythm.     Heart sounds: Normal heart sounds.  Pulmonary:     Effort: Pulmonary effort is normal.     Breath sounds: Normal breath sounds.  Skin:    General: Skin is warm and dry.  Neurological:     Mental Status: She is alert and oriented to person, place, and time.  Psychiatric:        Behavior: Behavior normal.     BP 134/64   Pulse (!) 51   Ht 5\' 5"  (1.651 m)   Wt 158 lb (71.7 kg)   SpO2 98%   BMI 26.29 kg/m  Wt Readings from Last 3 Encounters:  06/07/20 158 lb (71.7 kg)  04/10/20 155 lb (70.3 kg)  09/02/19 171 lb (77.6 kg)     Health Maintenance Due  Topic Date Due  . HIV Screening  Never done  . Hepatitis C Screening  Never done  . DEXA SCAN  Never done  . PNA vac Low Risk Adult (1 of 2 - PCV13) 07/07/2019  . COVID-19 Vaccine (3 - Booster for Pfizer series) 10/14/2019    There are no preventive care reminders to display for this patient.  Lab Results  Component Value Date   TSH 2.547 09/13/2009   Lab Results  Component Value Date   WBC 6.9 01/21/2019   HGB 16.2 (H) 01/21/2019   HCT 46.0 (H) 01/21/2019   MCV 95.4 01/21/2019   PLT 332 01/21/2019   Lab Results  Component Value Date   NA 143 01/21/2019   K 4.4 01/21/2019   CO2 29 01/21/2019   GLUCOSE 97 01/21/2019   BUN 11 01/21/2019   CREATININE 0.84 01/21/2019   BILITOT 0.5 01/21/2019   ALKPHOS 120 10/18/2015   AST 18 01/21/2019   ALT 11 01/21/2019   PROT 6.7 01/21/2019   ALBUMIN 4.2 10/18/2015   CALCIUM 9.8 01/21/2019   Lab Results  Component Value Date   CHOL 220 (H)  10/12/2008  Lab Results  Component Value Date   HDL 60 10/12/2008   Lab Results  Component Value Date   LDLCALC 138 (H) 10/12/2008   Lab Results  Component Value Date   TRIG 109 10/12/2008   Lab Results  Component Value Date   CHOLHDL 3.7 Ratio 10/12/2008   No results found for: HGBA1C    Assessment & Plan:   Problem List Items Addressed This Visit      Cardiovascular and Mediastinum   Essential hypertension    Are little borderline today but will continue to keep an eye on it.  Continue current regimen.  Due for updated blood work.      Relevant Orders   CBC   COMPLETE METABOLIC PANEL WITH GFR   Lipid panel   TSH   Sedimentation rate     Respiratory   CHRONIC OBSTRUCTIVE PULMONARY DISEASE, ACUTE EXACERBATION   Relevant Medications   albuterol (VENTOLIN HFA) 108 (90 Base) MCG/ACT inhaler     Other   MDD (major depressive disorder) - Primary    Increase fluoxetine to 60 mg she is actually been on this dose previously is just been sometime.  We also discussed ways to reduce her stress levels offered counseling/therapy encouraged her to think about it.  Otherwise follow-up in 3 to 4 months.      Relevant Medications   FLUoxetine 60 MG TABS    Other Visit Diagnoses    Abnormal weight loss       Relevant Orders   CBC   COMPLETE METABOLIC PANEL WITH GFR   Lipid panel   TSH   Sedimentation rate   Cough       Relevant Orders   DG Chest 2 View   Need for pneumococcal vaccine       Relevant Orders   Pneumococcal conjugate vaccine 20-valent (Prevnar 20) (Completed)     Cough-we will move forward with chest x-ray since she is had a persistent cough for 3 months at this point.  Consider further evaluation with spirometry as well to further assess the significance of her COPD.  It might be time to look at putting her on prophylactic inhaler. Consider further work up for if persists.    Abnormal weight loss-we will check for thyroid disorder, electrolyte  disturbance, check kidney and liver function.  We discussed strategies around trying to get her intake up.  I think it is directly related to decreased p.o. intake I am not as worried about an underlying disorder but again we will just make sure.  Is not having any other red flag systemic symptoms.  Her colonoscopy is up-to-date.  She is due now for her mammogram so just encouraged her to schedule she said she did get a reminder letter.   Meds ordered this encounter  Medications  . FLUoxetine 60 MG TABS    Sig: Take 60 mg by mouth daily.    Dispense:  90 tablet    Refill:  1  . albuterol (VENTOLIN HFA) 108 (90 Base) MCG/ACT inhaler    Sig: Inhale 2 puffs into the lungs every 6 (six) hours as needed for wheezing or shortness of breath.    Dispense:  8 g    Refill:  0    Follow-up: Return in about 4 weeks (around 07/05/2020) for weight check .    Beatrice Lecher, MD

## 2020-06-07 NOTE — Assessment & Plan Note (Signed)
Are little borderline today but will continue to keep an eye on it.  Continue current regimen.  Due for updated blood work.

## 2020-06-08 LAB — COMPLETE METABOLIC PANEL WITHOUT GFR
AG Ratio: 2 (calc) (ref 1.0–2.5)
ALT: 12 U/L (ref 6–29)
AST: 22 U/L (ref 10–35)
Albumin: 4.3 g/dL (ref 3.6–5.1)
Alkaline phosphatase (APISO): 87 U/L (ref 37–153)
BUN: 8 mg/dL (ref 7–25)
CO2: 31 mmol/L (ref 20–32)
Calcium: 9.5 mg/dL (ref 8.6–10.4)
Chloride: 104 mmol/L (ref 98–110)
Creat: 0.71 mg/dL (ref 0.50–0.99)
GFR, Est African American: 104 mL/min/{1.73_m2}
GFR, Est Non African American: 89 mL/min/{1.73_m2}
Globulin: 2.2 g/dL (ref 1.9–3.7)
Glucose, Bld: 105 mg/dL — ABNORMAL HIGH (ref 65–99)
Potassium: 5 mmol/L (ref 3.5–5.3)
Sodium: 141 mmol/L (ref 135–146)
Total Bilirubin: 0.4 mg/dL (ref 0.2–1.2)
Total Protein: 6.5 g/dL (ref 6.1–8.1)

## 2020-06-08 LAB — CBC
HCT: 45.5 % — ABNORMAL HIGH (ref 35.0–45.0)
Hemoglobin: 15.3 g/dL (ref 11.7–15.5)
MCH: 33.3 pg — ABNORMAL HIGH (ref 27.0–33.0)
MCHC: 33.6 g/dL (ref 32.0–36.0)
MCV: 99.1 fL (ref 80.0–100.0)
MPV: 10.2 fL (ref 7.5–12.5)
Platelets: 310 10*3/uL (ref 140–400)
RBC: 4.59 10*6/uL (ref 3.80–5.10)
RDW: 12.5 % (ref 11.0–15.0)
WBC: 5.4 10*3/uL (ref 3.8–10.8)

## 2020-06-08 LAB — LIPID PANEL
Cholesterol: 276 mg/dL — ABNORMAL HIGH (ref <200)
HDL: 66 mg/dL (ref >=50)
LDL Cholesterol (Calc): 187 mg/dL (calc) — ABNORMAL HIGH
Non-HDL Cholesterol (Calc): 210 mg/dL (calc) — ABNORMAL HIGH (ref <130)
Total CHOL/HDL Ratio: 4.2 (calc) (ref <5.0)
Triglycerides: 106 mg/dL (ref <150)

## 2020-06-08 LAB — SEDIMENTATION RATE: Sed Rate: 6 mm/h (ref 0–30)

## 2020-06-08 LAB — TSH: TSH: 2.45 m[IU]/L (ref 0.40–4.50)

## 2020-06-09 DIAGNOSIS — J439 Emphysema, unspecified: Secondary | ICD-10-CM | POA: Insufficient documentation

## 2020-06-09 MED ORDER — TIOTROPIUM BROMIDE-OLODATEROL 2.5-2.5 MCG/ACT IN AERS: 2.0000 | INHALATION_SPRAY | Freq: Every day | RESPIRATORY_TRACT | 3 refills | Status: DC

## 2020-06-14 ENCOUNTER — Other Ambulatory Visit: Payer: Self-pay | Admitting: *Deleted

## 2020-06-14 MED ORDER — ATORVASTATIN CALCIUM 40 MG PO TABS: 40.0000 mg | ORAL_TABLET | Freq: Every day | ORAL | 3 refills | Status: DC

## 2020-06-30 ENCOUNTER — Other Ambulatory Visit: Payer: Self-pay | Admitting: Family Medicine

## 2020-06-30 DIAGNOSIS — J441 Chronic obstructive pulmonary disease with (acute) exacerbation: Secondary | ICD-10-CM

## 2020-07-10 ENCOUNTER — Encounter: Payer: Self-pay | Admitting: Family Medicine

## 2020-07-10 ENCOUNTER — Other Ambulatory Visit: Payer: Self-pay | Admitting: Sports Medicine

## 2020-07-10 ENCOUNTER — Other Ambulatory Visit: Payer: Self-pay

## 2020-07-10 ENCOUNTER — Ambulatory Visit (INDEPENDENT_AMBULATORY_CARE_PROVIDER_SITE_OTHER): Payer: Self-pay | Admitting: Family Medicine

## 2020-07-10 VITALS — BP 138/81 | HR 59 | Ht 65.0 in | Wt 151.0 lb

## 2020-07-10 DIAGNOSIS — Q761 Klippel-Feil syndrome: Secondary | ICD-10-CM

## 2020-07-10 DIAGNOSIS — J439 Emphysema, unspecified: Secondary | ICD-10-CM

## 2020-07-10 DIAGNOSIS — E785 Hyperlipidemia, unspecified: Secondary | ICD-10-CM

## 2020-07-10 DIAGNOSIS — J441 Chronic obstructive pulmonary disease with (acute) exacerbation: Secondary | ICD-10-CM

## 2020-07-10 DIAGNOSIS — R059 Cough, unspecified: Secondary | ICD-10-CM

## 2020-07-10 DIAGNOSIS — I10 Essential (primary) hypertension: Secondary | ICD-10-CM

## 2020-07-10 DIAGNOSIS — R634 Abnormal weight loss: Secondary | ICD-10-CM

## 2020-07-10 DIAGNOSIS — Z780 Asymptomatic menopausal state: Secondary | ICD-10-CM

## 2020-07-10 DIAGNOSIS — F331 Major depressive disorder, recurrent, moderate: Secondary | ICD-10-CM

## 2020-07-10 DIAGNOSIS — F17200 Nicotine dependence, unspecified, uncomplicated: Secondary | ICD-10-CM

## 2020-07-10 MED ORDER — PREDNISONE 20 MG PO TABS: 40.0000 mg | ORAL_TABLET | Freq: Every day | ORAL | 0 refills | Status: DC

## 2020-07-10 NOTE — Assessment & Plan Note (Signed)
Do feel like she has a COPD exacerbation and increased albuterol use to 3 times a day for the next couple of days and then taper down.  We will give her a round of prednisone she is feeling some better than she was a few days ago so we will hold off on antibiotics.

## 2020-07-10 NOTE — Assessment & Plan Note (Signed)
Rating statin well without any side effects or problems.  We will plan to recheck lipids in 6 months.

## 2020-07-10 NOTE — Assessment & Plan Note (Signed)
At this point encouraged her to continue to work on trying to increase her daily caloric intake she is really struggled with this even since the last time I saw her.  She is also behind on her colon cancer and breast cancer screening so encouraged her to get those scheduled ASAP.

## 2020-07-10 NOTE — Assessment & Plan Note (Signed)
Does feel like she is resting better on the 60 mg fluoxetine.  She did move it to nighttime and that seems to be effective.

## 2020-07-10 NOTE — Progress Notes (Signed)
Established Patient Office Visit  Subjective:  Patient ID: Sherri [name], female    DOB: 1955-11-04  Age: 65 y.o. MRN: [id number]  CC: No chief complaint on file.   HPI Sherri [name] presents for   F/u cough - CXR showed emphysema.  Started her on Spiriva.  She says she never started the inhaler.  F/u abnormal weight loss -she had lost about 20 pounds over a year but was only eating about once a day. She has lost about 7 more lbs.    Follow-up major depressive disorder-we also increased her fluoxetine to 60 mg she had been on that dose previously at one-point.  She also reports that she was sick last week. She was congested in her chest with white phlegm. She had chills the first night on Thursday. She is feeling better but is still wheezing.   Also recent labs revealed significant hyperlipidemia, LDL 187.  Current 10-year cardiovascular risk score is 16% which is significant.  New prescription for atorvastatin sent to pharmacy. She did start the medication.    Past Medical History:  Diagnosis Date   Anxiety    Arthritis    Asthma    COPD (chronic obstructive pulmonary disease) ([place])    Depression    Pneumonia    Recurrent upper respiratory infection (URI)     Past Surgical History:  Procedure Laterality Date   ankle sx  1973   APPENDECTOMY     BREAST REDUCTION SURGERY  06/24/2011   Procedure: MAMMARY REDUCTION  (BREAST);  Surgeon: Cristine Polio, MD;  Location: [facility];  Service: Plastics;  Laterality: Bilateral;   CERVICAL DISC SURGERY     (? laminectomy?)   CESAREAN SECTION  1976   left groin hernia  1974   REDUCTION MAMMAPLASTY     TONSILLECTOMY      Family History  Problem Relation Age of Onset   Hypertension Mother    Depression Mother    Alzheimer's disease Mother    Diverticulitis Mother    Alcohol abuse Father    Diabetes Father    Cancer Other        pancreatic   Pancreatic cancer Maternal Grandmother    Colon cancer Neg  Hx    Colon polyps Neg Hx    Esophageal cancer Neg Hx    Rectal cancer Neg Hx    Stomach cancer Neg Hx     Social History   Socioeconomic History   Marital status: Married    Spouse name: Not on file   Number of children: Not on file   Years of education: Not on file   Highest education level: Not on file  Occupational History   Not on file  Tobacco Use   Smoking status: Every Day    Packs/day: 0.50    Years: 35.00    Pack years: 17.50    Types: Cigarettes   Smokeless tobacco: Never  Vaping Use   Vaping Use: Never used  Substance and Sexual Activity   Alcohol use: No    Alcohol/week: 0.0 standard drinks   Drug use: No   Sexual activity: Not on file  Other Topics Concern   Not on file  Social History Narrative   Not on file   Social Determinants of Health   Financial Resource Strain: Not on file  Food Insecurity: Not on file  Transportation Needs: Not on file  Physical Activity: Not on file  Stress: Not on file  Social Connections: Not on  file  Intimate Partner Violence: Not on file    Outpatient Medications Prior to Visit  Medication Sig Dispense Refill   albuterol (VENTOLIN HFA) 108 (90 Base) MCG/ACT inhaler TAKE 2 PUFFS BY MOUTH EVERY 6 HOURS AS NEEDED FOR WHEEZE OR SHORTNESS OF BREATH 8.5 each 6   atorvastatin (LIPITOR) 40 MG tablet Take 1 tablet (40 mg total) by mouth at bedtime. 90 tablet 3   FLUoxetine 60 MG TABS Take 60 mg by mouth daily. 90 tablet 1   gabapentin (NEURONTIN) 300 MG capsule 1 capsule in the morning, 2 capsules at night.  May increase to 2 capsules in the morning and 3 capsules at night if needed (Patient taking differently: 2 capsules in the morning and 3 capsules at night) 180 capsule 3   hydrochlorothiazide (HYDRODIURIL) 25 MG tablet TAKE 1 TABLET BY MOUTH EVERY DAY 90 tablet 1   Tiotropium Bromide-Olodaterol 2.5-2.5 MCG/ACT AERS Inhale 2 puffs into the lungs daily. 4 g 3   No facility-administered medications prior to visit.     Allergies  Allergen Reactions   Hydrocodone Nausea And Vomiting   Penicillins Hives    REACTION: hives    ROS Review of Systems    Objective:    Physical Exam Constitutional:      Appearance: She is well-developed.  HENT:     Head: Normocephalic and atraumatic.  Cardiovascular:     Rate and Rhythm: Normal rate and regular rhythm.     Heart sounds: Normal heart sounds.  Pulmonary:     Effort: Pulmonary effort is normal.     Breath sounds: Wheezing present.     Comments: Diffuse rhonchi and expiratory wheezing. Skin:    General: Skin is warm and dry.  Neurological:     Mental Status: She is alert and oriented to person, place, and time.  Psychiatric:        Behavior: Behavior normal.    BP 138/81   Pulse (!) 59   Ht 5\' 5"  (1.651 m)   Wt 151 lb (68.5 kg)   SpO2 97%   BMI 25.13 kg/m  Wt Readings from Last 3 Encounters:  07/10/20 151 lb (68.5 kg)  06/07/20 158 lb (71.7 kg)  04/10/20 155 lb (70.3 kg)     Health Maintenance Due  Topic Date Due   Hepatitis C Screening  Never done   Zoster Vaccines- Shingrix (1 of 2) Never done   DEXA SCAN  Never done   PNA vac Low Risk Adult (1 of 2 - PCV13) 07/07/2019   COVID-19 Vaccine (3 - Booster for Pfizer series) 10/14/2019    There are no preventive care reminders to display for this patient.  Lab Results  Component Value Date   TSH 2.45 06/07/2020   Lab Results  Component Value Date   WBC 5.4 06/07/2020   HGB 15.3 06/07/2020   HCT 45.5 (H) 06/07/2020   MCV 99.1 06/07/2020   PLT 310 06/07/2020   Lab Results  Component Value Date   NA 141 06/07/2020   K 5.0 06/07/2020   CO2 31 06/07/2020   GLUCOSE 105 (H) 06/07/2020   BUN 8 06/07/2020   CREATININE 0.71 06/07/2020   BILITOT 0.4 06/07/2020   ALKPHOS 120 10/18/2015   AST 22 06/07/2020   ALT 12 06/07/2020   PROT 6.5 06/07/2020   ALBUMIN 4.2 10/18/2015   CALCIUM 9.5 06/07/2020   Lab Results  Component Value Date   CHOL 276 (H) 06/07/2020   Lab  Results  Component Value  Date   HDL 66 06/07/2020   Lab Results  Component Value Date   LDLCALC 187 (H) 06/07/2020   Lab Results  Component Value Date   TRIG 106 06/07/2020   Lab Results  Component Value Date   CHOLHDL 4.2 06/07/2020   No results found for: HGBA1C    Assessment & Plan:   Problem List Items Addressed This Visit       Cardiovascular and Mediastinum   Essential hypertension - Primary     Respiratory   Pulmonary emphysema ([place])    We will schedule her for spirometry this summer.  For further work-up.       Relevant Medications   predniSONE (DELTASONE) 20 MG tablet   CHRONIC OBSTRUCTIVE PULMONARY DISEASE, ACUTE EXACERBATION    Do feel like she has a COPD exacerbation and increased albuterol use to 3 times a day for the next couple of days and then taper down.  We will give her a round of prednisone she is feeling some better than she was a few days ago so we will hold off on antibiotics.       Relevant Medications   predniSONE (DELTASONE) 20 MG tablet     Other   TOBACCO DEPENDENCE   MDD (major depressive disorder)    Does feel like she is resting better on the 60 mg fluoxetine.  She did move it to nighttime and that seems to be effective.       Hyperlipidemia with target LDL less than 100    Rating statin well without any side effects or problems.  We will plan to recheck lipids in 6 months.       Abnormal weight loss    At this point encouraged her to continue to work on trying to increase her daily caloric intake she is really struggled with this even since the last time I saw her.  She is also behind on her colon cancer and breast cancer screening so encouraged her to get those scheduled ASAP.       Other Visit Diagnoses     Cough       Post-menopausal       Relevant Orders   DG Bone Density       Meds ordered this encounter  Medications   predniSONE (DELTASONE) 20 MG tablet    Sig: Take 2 tablets (40 mg total) by mouth daily  with breakfast.    Dispense:  10 tablet    Refill:  0     Follow-up: Return in about 4 weeks (around 08/07/2020) for Spirometry .    Beatrice Lecher, MD

## 2020-07-10 NOTE — Assessment & Plan Note (Signed)
We will schedule her for spirometry this summer.  For further work-up.

## 2020-08-02 ENCOUNTER — Telehealth: Payer: Self-pay | Admitting: Family Medicine

## 2020-08-02 MED ORDER — AZITHROMYCIN 250 MG PO TABS: ORAL_TABLET | ORAL | 0 refills | Status: AC

## 2020-08-02 NOTE — Telephone Encounter (Signed)
I called pt and made her of aware of meds sent and if not feeling better next week, she will need a in person appt

## 2020-08-02 NOTE — Telephone Encounter (Signed)
ABX sent. If not better by next week, needs in person appt.   Meds ordered this encounter  Medications   azithromycin (ZITHROMAX) 250 MG tablet    Sig: 2 Ttabs PO on Day 1, then one a day x 4 days.    Dispense:  6 tablet    Refill:  0

## 2020-08-02 NOTE — Telephone Encounter (Signed)
Pt called to cancel her Spirometry due to her still not better... She states Dr.Metheney had her on Steroids for the wheezing and breathing issues and states it has now turned into a lot of congestion. She would like to know if you want to try something else such as antibiotics since steroid did not work? Please call patient with advice. Thank you

## 2020-08-07 ENCOUNTER — Ambulatory Visit: Payer: Self-pay

## 2020-08-18 ENCOUNTER — Encounter: Payer: Self-pay | Admitting: Physician Assistant

## 2020-08-18 ENCOUNTER — Other Ambulatory Visit: Payer: Self-pay

## 2020-08-18 ENCOUNTER — Ambulatory Visit (INDEPENDENT_AMBULATORY_CARE_PROVIDER_SITE_OTHER): Payer: Medicare Other | Admitting: Physician Assistant

## 2020-08-18 ENCOUNTER — Ambulatory Visit (INDEPENDENT_AMBULATORY_CARE_PROVIDER_SITE_OTHER): Payer: Medicare Other

## 2020-08-18 VITALS — BP 99/49 | HR 68 | Temp 98.6°F | Ht 65.0 in | Wt 148.0 lb

## 2020-08-18 DIAGNOSIS — J441 Chronic obstructive pulmonary disease with (acute) exacerbation: Secondary | ICD-10-CM

## 2020-08-18 DIAGNOSIS — R0602 Shortness of breath: Secondary | ICD-10-CM | POA: Diagnosis not present

## 2020-08-18 DIAGNOSIS — H8112 Benign paroxysmal vertigo, left ear: Secondary | ICD-10-CM | POA: Insufficient documentation

## 2020-08-18 DIAGNOSIS — R059 Cough, unspecified: Secondary | ICD-10-CM

## 2020-08-18 DIAGNOSIS — Z1159 Encounter for screening for other viral diseases: Secondary | ICD-10-CM

## 2020-08-18 DIAGNOSIS — Z9181 History of falling: Secondary | ICD-10-CM

## 2020-08-18 DIAGNOSIS — J439 Emphysema, unspecified: Secondary | ICD-10-CM | POA: Diagnosis not present

## 2020-08-18 DIAGNOSIS — Z13820 Encounter for screening for osteoporosis: Secondary | ICD-10-CM

## 2020-08-18 DIAGNOSIS — R42 Dizziness and giddiness: Secondary | ICD-10-CM

## 2020-08-18 MED ORDER — PREDNISONE 20 MG PO TABS
ORAL_TABLET | ORAL | 0 refills | Status: DC
Start: 2020-08-18 — End: 2020-09-05

## 2020-08-18 NOTE — Progress Notes (Signed)
Subjective:    Patient ID: Sherri [name], female    DOB: 26-Oct-1955, 65 y.o.   MRN: [id number]  HPI Pt is a 65 yo obese female with COPD who continues to smoke, HTN, HLD who presents to the clinic with continued problems with SOB, cough, and now dizziness.   Pt just finished zpak and last prednisone round was given 07/10/2020. She is using albuterol/nebulizer regularly. She was just able to get stiloto approved and started it this week. Not noticed any improvement with breathing yet.   She is concerned because she is getting more and more dizzy. She will get up to stand and feel "like she is falling over". Most of the time she does lean to the left. She is on gabapentin. She has falling over at home but not hurt anything. No vision changes, headaches. She does feel like she has some weakness in her upper extremity.   .. 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/29/2019   Pulmonary emphysema ([place]) 06/09/2020   Hyperlipidemia with target LDL less than 100 07/10/2020   Abnormal weight loss 07/10/2020   Benign paroxysmal positional vertigo, left 08/18/2020   Resolved Ambulatory Problems    Diagnosis Date Noted   Cellulitis of breast 06/09/2015   Atypical chest pain 10/18/2015   Acute bronchitis with COPD ([place]) 04/10/2020   Past Medical History:  Diagnosis Date   Anxiety    Arthritis    Asthma    COPD (chronic obstructive  pulmonary disease) ([facility])    Depression    Pneumonia    Recurrent upper respiratory infection (URI)     Review of Systems    See HPI.  Objective:   Physical Exam Vitals reviewed.  Constitutional:      Appearance: Normal appearance. She is obese.  HENT:     Head: Normocephalic.     Right Ear: Tympanic membrane, ear canal and external ear normal. There is no impacted cerumen.     Left Ear: Tympanic membrane, ear canal and external ear normal. There is no impacted cerumen.     Nose: Nose normal. No congestion.     Mouth/Throat:     Mouth: Mucous membranes are moist.  Eyes:     Extraocular Movements: Extraocular movements intact.     Conjunctiva/sclera: Conjunctivae normal.     Pupils: Pupils are equal, round, and reactive to light.  Cardiovascular:     Rate and Rhythm: Normal rate and regular rhythm.     Pulses: Normal pulses.     Heart sounds: Normal heart sounds.  Pulmonary:     Effort: Pulmonary effort is normal.     Breath sounds: Wheezing and rhonchi present.     Comments: Coarse breath sounds worse over right lungs.  Abdominal:     Palpations: Abdomen is soft.  Musculoskeletal:     Cervical back: Normal range of motion and neck supple.  Lymphadenopathy:     Cervical: No cervical adenopathy.  Neurological:     General:  No focal deficit present.     Mental Status: She is alert and oriented to person, place, and time.     Gait: Gait abnormal.     Comments: Leaning to the left when trying to stand and walk. Had to catch patient in the room.   Dix hallpike no nystagmus but dizziness to left with maneuver.   Upper extremity 4/5 noted some weakness.   Psychiatric:        Mood and Affect: Mood normal.          Assessment & Plan:  Marland KitchenMarland KitchenAlexia was seen today for bronchitis with copd.  Diagnoses and all orders for this visit:  COPD exacerbation ([place]) -     predniSONE (DELTASONE) 20 MG tablet; Take 3 tablets for 3 days, take 2 tablets for 3 days, take 1 tablet for 3  days, and then 1/2 tablet for 4 days.  Screening for osteoporosis  Need for hepatitis C screening test  Benign paroxysmal positional vertigo, left  Cough -     DG Chest 2 View; Future  SOB (shortness of breath) on exertion -     DG Chest 2 View; Future  Pulmonary emphysema, unspecified emphysema type ([place]) -     DG Chest 2 View; Future  Pt appears to continue to be in COPD exacerbation.  Just completed zpak. Get CXR if no sign of infection. Longer prednisone only taper given and will hold off on any more abx. Continue to use stiloto and rescue nebs/inhaler. Urged to stop smoking.   Concerned about dizziness/weakness/leaning/falls with age will get CT of head.

## 2020-08-18 NOTE — Patient Instructions (Signed)
Vertigo Vertigo is the feeling that you or the things around you are moving when they are not. This feeling can come and go at any time. Vertigo often goes away on its own. This condition can be dangerous if it happens when you are doingactivities like driving or working with machines. Your doctor will do tests to find the cause of your vertigo. These tests willalso help your doctor decide on the best treatment for you. Follow these instructions at home: Eating and drinking     Drink enough fluid to keep your pee (urine) pale yellow. Do not drink alcohol. Activity Return to your normal activities when your doctor says that it is safe. In the morning, first sit up on the side of the bed. When you feel okay, stand slowly while you hold onto something until you know that your balance is fine. Move slowly. Avoid sudden body or head movements or certain positions, as told by your doctor. Use a cane if you have trouble standing or walking. Sit down right away if you feel dizzy. Avoid doing any tasks or activities that can cause danger to you or others if you get dizzy. Avoid bending down if you feel dizzy. Place items in your home so that they are easy for you to reach without bending or leaning over. Do not drive or use machinery if you feel dizzy. General instructions Take over-the-counter and prescription medicines only as told by your doctor. Keep all follow-up visits. Contact a doctor if: Your medicine does not help your vertigo. Your problems get worse or you have new symptoms. You have a fever. You feel like you may vomit (nauseous), or this feeling gets worse. You start to vomit. Your family or friends see changes in how you act. You lose feeling (have numbness) in part of your body. You feel prickling and tingling in a part of your body. Get help right away if: You are always dizzy. You faint. You get very bad headaches. You get a stiff neck. Bright light starts to bother  you. You have trouble moving or talking. You feel weak in your hands, arms, or legs. You have changes in your hearing or in how you see (vision). These symptoms may be an emergency. Get help right away. Call your local emergency services (911 in the [place] Do not wait to see if the symptoms will go away. Do not drive yourself to the hospital. Summary Vertigo is the feeling that you or the things around you are moving when they are not. Your doctor will do tests to find the cause of your vertigo. You may be told to avoid some tasks, positions, or movements. Contact a doctor if your medicine is not helping, or if you have a fever, new symptoms, or a change in how you act. Get help right away if you get very bad headaches, or if you have changes in how you speak, hear, or see. This information is not intended to replace advice given to you by your health care provider. Make sure you discuss any questions you have with your healthcare provider. Document Revised: 12/01/2019 Document Reviewed: 12/01/2019 Elsevier Patient Education  2022 [employer] Inc.  

## 2020-08-18 NOTE — Progress Notes (Signed)
Sherri [name],   No active infection. Just take prednisone. No antibiotic needed.

## 2020-08-21 ENCOUNTER — Encounter: Payer: Self-pay | Admitting: Physician Assistant

## 2020-08-29 ENCOUNTER — Ambulatory Visit (INDEPENDENT_AMBULATORY_CARE_PROVIDER_SITE_OTHER): Payer: Medicare Other

## 2020-08-29 ENCOUNTER — Other Ambulatory Visit: Payer: Self-pay

## 2020-08-29 DIAGNOSIS — R42 Dizziness and giddiness: Secondary | ICD-10-CM

## 2020-08-30 NOTE — Progress Notes (Signed)
GREAT news. No acute intracranial process. Continue epley manuevers for dizziness. If continues consider vestibular rehab.

## 2020-09-03 ENCOUNTER — Other Ambulatory Visit: Payer: Self-pay | Admitting: Family Medicine

## 2020-09-03 DIAGNOSIS — F331 Major depressive disorder, recurrent, moderate: Secondary | ICD-10-CM

## 2020-09-05 ENCOUNTER — Encounter: Payer: Self-pay | Admitting: Physician Assistant

## 2020-09-05 ENCOUNTER — Telehealth (INDEPENDENT_AMBULATORY_CARE_PROVIDER_SITE_OTHER): Payer: Medicare Other | Admitting: Physician Assistant

## 2020-09-05 VITALS — BP 115/65 | HR 70 | Temp 98.1°F | Ht 65.0 in | Wt 145.0 lb

## 2020-09-05 DIAGNOSIS — J441 Chronic obstructive pulmonary disease with (acute) exacerbation: Secondary | ICD-10-CM

## 2020-09-05 MED ORDER — DOXYCYCLINE HYCLATE 100 MG PO TABS: 100.0000 mg | ORAL_TABLET | Freq: Two times a day (BID) | ORAL | 0 refills | Status: DC

## 2020-09-05 NOTE — Progress Notes (Signed)
..Virtual Visit via Video Note  I connected with Sherri [name] on 09/08/20 at  1:00 PM EDT by a video enabled telemedicine application and verified that I am speaking with the correct person using two identifiers.  Location: Patient: home Provider: clinic  .Marland KitchenParticipating in visit:  Patient: Sherri [name] Provider: Iran Planas PA-C   I discussed the limitations of evaluation and management by telemedicine and the availability of in person appointments. The patient expressed understanding and agreed to proceed.  History of Present Illness: Pt is a 65 yo female with COPD, HTN who calls into the clinic with worsening cough and congestion. Pt is on stilto and rescue inhaler for COPD. Last CXR was 8/5 and showed no active acute disease. She just finished prednisone taper with some improvement but worsens when she came off of it. She is not able to check pulse ox at home but in office she was 98 percent. She has not been able to mow the lawn and other household chores due to SOB. Denies any leg swelling or pain. No fever, chills, body aches. She continues to smoke. She felt the best on zpak.    .. 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/29/2019   Pulmonary emphysema ([place]) 06/09/2020   Hyperlipidemia with target LDL less than 100 07/10/2020   Abnormal weight loss 07/10/2020   Benign paroxysmal positional vertigo, left 08/18/2020   Resolved  Ambulatory Problems    Diagnosis Date Noted   Cellulitis of breast 06/09/2015   Atypical chest pain 10/18/2015   Acute bronchitis with COPD ([place]) 04/10/2020   Past Medical History:  Diagnosis Date   Anxiety    Arthritis    Asthma    COPD (chronic obstructive pulmonary disease) (HCC)    Depression    Pneumonia    Recurrent upper respiratory infection (URI)        Observations/Objective: No acute distress She is breathing hard but not gasping.  She has a productive cough.  No wheezing No JVD on video.  No leg swelling seen on video.   .. Today's Vitals   09/05/20 1302  BP: 115/65  Pulse: 70  Temp: 98.1 F (36.7 C)  Weight: 145 lb (65.8 kg)  Height: '5\' 5"'$  (1.651 m)   Body mass index is 24.13 kg/m.    Assessment and Plan: Marland KitchenMarland KitchenAlbie was seen today for cough.  Diagnoses and all orders for this visit:  COPD exacerbation ([place]) -     doxycycline (VIBRA-TABS) 100 MG tablet; Take 1 tablet (100 mg total) by mouth 2 (two) times daily.  Vitals are stable.  Encouraged to get pulse ox to manage O2 stats. Make sure staying above 92 percent.  Last CXR no infection or acute changes.  Continue on stilito. May need to change to triple therapy but she just changed to double therapy.  Continue on albuterol regularly every 4-6 hours. She just finished prednisone taper. She continues to blow and cough out green/brown sputum.  Added doxycycline.  Follow up  with PCP in 2-4 weeks.    Follow Up Instructions:    I discussed the assessment and treatment plan with the patient. The patient was provided an opportunity to ask questions and all were answered. The patient agreed with the plan and demonstrated an understanding of the instructions.   The patient was advised to call back or seek an in-person evaluation if the symptoms worsen or if the condition fails to improve as anticipated.      Iran Planas, PA-C

## 2020-09-05 NOTE — Progress Notes (Signed)
Patient wants to discuss lingering congestion and cough

## 2020-09-22 NOTE — Progress Notes (Addendum)
Acute Office Visit  Subjective:    Patient ID: Sherri [name], female    DOB: 1955-05-16, 65 y.o.   MRN: [id number]  Chief Complaint  Patient presents with   Cough   COPD    HPI Patient is in today for coughing.  Patient reports she is here to follow back up on her chronic coughing.  She was recently diagnosed with emphysema.  In June she saw PCP and was started on Stiolto inhaler and steroids.  In July a Z-Pak was added.  In early August she was seen again and started on prednisone.  Chest x-ray did not show anything acute at that time.  About 3 weeks ago she was still having symptoms and started a course of Doxy.  Patient reports she has had some improvement but is still having these ongoing symptoms that she is worried about.  States she is having a lot of chest congestion with occasional yellow phlegm, dyspnea with exertion at times, occasional wheezing.  States her symptoms are not all day long but seem to be when she first wakes up and then later at night.  For the most part during the day she does fairly well.  She has been using her daily inhaler as prescribed, and only using albuterol once a day at most.  Reports she ran out of her Stiolto inhaler last Thursday and has not noticed significant decline since being off of it for a few days.  She is a smoker but states she is trying to cut back.  She has not had any fevers, nausea, vomiting, diarrhea, pain with inspiration, bloody sputum, difficulty breathing at rest, wheezing at rest, chest pain.  Reports she does have seasonal allergies which seem to be worse in the Spring and Fall, starting to flare up now. She takes Zyrtec every day and uses occasional Flonase.        Past Medical History:  Diagnosis Date   Anxiety    Arthritis    Asthma    COPD (chronic obstructive pulmonary disease) ([place])    Depression    Pneumonia    Recurrent upper respiratory infection (URI)     Past Surgical History:  Procedure Laterality Date    ankle sx  1973   APPENDECTOMY     BREAST REDUCTION SURGERY  06/24/2011   Procedure: MAMMARY REDUCTION  (BREAST);  Surgeon: Cristine Polio, MD;  Location: [facility];  Service: Plastics;  Laterality: Bilateral;   CERVICAL DISC SURGERY     (? laminectomy?)   CESAREAN SECTION  1976   left groin hernia  1974   REDUCTION MAMMAPLASTY     TONSILLECTOMY      Family History  Problem Relation Age of Onset   Hypertension Mother    Depression Mother    Alzheimer's disease Mother    Diverticulitis Mother    Alcohol abuse Father    Diabetes Father    Cancer Other        pancreatic   Pancreatic cancer Maternal Grandmother    Colon cancer Neg Hx    Colon polyps Neg Hx    Esophageal cancer Neg Hx    Rectal cancer Neg Hx    Stomach cancer Neg Hx     Social History   Socioeconomic History   Marital status: Married    Spouse name: Not on file   Number of children: Not on file   Years of education: Not on file   Highest education level: Not on file  Occupational History   Not on file  Tobacco Use   Smoking status: Every Day    Packs/day: 0.50    Years: 35.00    Pack years: 17.50    Types: Cigarettes   Smokeless tobacco: Never  Vaping Use   Vaping Use: Never used  Substance and Sexual Activity   Alcohol use: No    Alcohol/week: 0.0 standard drinks   Drug use: No   Sexual activity: Not on file  Other Topics Concern   Not on file  Social History Narrative   Not on file   Social Determinants of Health   Financial Resource Strain: Not on file  Food Insecurity: Not on file  Transportation Needs: Not on file  Physical Activity: Not on file  Stress: Not on file  Social Connections: Not on file  Intimate Partner Violence: Not on file    Outpatient Medications Prior to Visit  Medication Sig Dispense Refill   albuterol (VENTOLIN HFA) 108 (90 Base) MCG/ACT inhaler TAKE 2 PUFFS BY MOUTH EVERY 6 HOURS AS NEEDED FOR WHEEZE OR SHORTNESS OF BREATH 8.5 each 6    atorvastatin (LIPITOR) 40 MG tablet Take 1 tablet (40 mg total) by mouth at bedtime. 90 tablet 3   FLUoxetine HCl 60 MG TABS TAKE 60 MG BY MOUTH DAILY. 90 tablet 1   gabapentin (NEURONTIN) 300 MG capsule 2 capsules in the morning and 3 capsules at night 450 capsule 3   hydrochlorothiazide (HYDRODIURIL) 25 MG tablet TAKE 1 TABLET BY MOUTH EVERY DAY 90 tablet 1   doxycycline (VIBRA-TABS) 100 MG tablet Take 1 tablet (100 mg total) by mouth 2 (two) times daily. 20 tablet 0   Tiotropium Bromide-Olodaterol 2.5-2.5 MCG/ACT AERS Inhale 2 puffs into the lungs daily. 4 g 3   No facility-administered medications prior to visit.    Allergies  Allergen Reactions   Hydrocodone Nausea And Vomiting   Penicillins Hives    REACTION: hives    Review of Systems All review of systems negative except what is listed in the HPI     Objective:    Physical Exam Vitals reviewed.  Constitutional:      Appearance: Normal appearance.  HENT:     Head: Normocephalic.     Right Ear: Tympanic membrane normal.     Left Ear: Tympanic membrane normal.     Nose: Congestion present. No rhinorrhea.     Mouth/Throat:     Mouth: Mucous membranes are moist.     Pharynx: Oropharynx is clear. No oropharyngeal exudate or posterior oropharyngeal erythema.  Cardiovascular:     Rate and Rhythm: Normal rate and regular rhythm.     Pulses: Normal pulses.     Heart sounds: Normal heart sounds.  Pulmonary:     Effort: Pulmonary effort is normal. No respiratory distress.     Breath sounds: Normal breath sounds. No wheezing, rhonchi or rales.  Musculoskeletal:     Cervical back: Normal range of motion and neck supple.  Lymphadenopathy:     Cervical: No cervical adenopathy.  Skin:    General: Skin is warm and dry.     Capillary Refill: Capillary refill takes less than 2 seconds.  Neurological:     General: No focal deficit present.     Mental Status: She is alert and oriented to person, place, and time. Mental status is  at baseline.  Psychiatric:        Mood and Affect: Mood normal.        Behavior: Behavior  normal.        Thought Content: Thought content normal.        Judgment: Judgment normal.    BP (!) 143/76   Pulse 61   Temp 98.1 F (36.7 C)   Resp 18   Wt 145 lb 11.2 oz (66.1 kg)   SpO2 97%   BMI 24.25 kg/m  Wt Readings from Last 3 Encounters:  09/25/20 145 lb 11.2 oz (66.1 kg)  09/05/20 145 lb (65.8 kg)  08/18/20 148 lb (67.1 kg)    Health Maintenance Due  Topic Date Due   DEXA SCAN  Never done   COVID-19 Vaccine (3 - Booster for Pfizer series) 10/14/2019   INFLUENZA VACCINE  08/14/2020    There are no preventive care reminders to display for this patient.   Lab Results  Component Value Date   TSH 2.45 06/07/2020   Lab Results  Component Value Date   WBC 5.4 06/07/2020   HGB 15.3 06/07/2020   HCT 45.5 (H) 06/07/2020   MCV 99.1 06/07/2020   PLT 310 06/07/2020   Lab Results  Component Value Date   NA 141 06/07/2020   K 5.0 06/07/2020   CO2 31 06/07/2020   GLUCOSE 105 (H) 06/07/2020   BUN 8 06/07/2020   CREATININE 0.71 06/07/2020   BILITOT 0.4 06/07/2020   ALKPHOS 120 10/18/2015   AST 22 06/07/2020   ALT 12 06/07/2020   PROT 6.5 06/07/2020   ALBUMIN 4.2 10/18/2015   CALCIUM 9.5 06/07/2020   Lab Results  Component Value Date   CHOL 276 (H) 06/07/2020   Lab Results  Component Value Date   HDL 66 06/07/2020   Lab Results  Component Value Date   LDLCALC 187 (H) 06/07/2020   Lab Results  Component Value Date   TRIG 106 06/07/2020   Lab Results  Component Value Date   CHOLHDL 4.2 06/07/2020   No results found for: HGBA1C     Assessment & Plan:   1. Pulmonary emphysema, unspecified emphysema type ([place]) 2. Seasonal allergies  Patient appears well overall, occasional congested cough heard in office, but lungs are clear, no distress, O2 97% today. Given that she was just on several courses of steroids and antibiotics and CXR last month was normal,  will keep more conservative for now. Since we are going into her allergy season, would like her taking Zyrtec and Flonase every day. Adding in Singulair at bedtime as another agent for allergy/asthma to help prevent COPD flares. Refilling Stiolto inhaler today. Discussed significance of smoking cessation. Would like her to see PCP in about a month, this will give [place] at least 3 months on Stiolto inhaler and one month with adding Singulair in case changes need to be made at that time. She will likely need spirometry also. Patient aware of signs/symptoms requiring further/urgent evaluation.   BP is elevated today, but she reports she has not yet taken her medications this morning. Reports readings are better at home. Encouraged her to keep monitoring and let us know if they are elevated. Will recheck at next visit.  - Tiotropium Bromide-Olodaterol 2.5-2.5 MCG/ACT AERS; Inhale 2 puffs into the lungs daily.  Dispense: 4 g; Refill: 3 - montelukast (SINGULAIR) 10 MG tablet; Take 1 tablet (10 mg total) by mouth at bedtime.  Dispense: 90 tablet; Refill: 3  Follow-up with PCP in one month.   Purcell Nails Olevia Bowens, DNP, FNP-C

## 2020-09-25 ENCOUNTER — Ambulatory Visit (INDEPENDENT_AMBULATORY_CARE_PROVIDER_SITE_OTHER): Payer: Medicare Other | Admitting: Family Medicine

## 2020-09-25 ENCOUNTER — Encounter: Payer: Self-pay | Admitting: Family Medicine

## 2020-09-25 VITALS — BP 143/76 | HR 61 | Temp 98.1°F | Resp 18 | Wt 145.7 lb

## 2020-09-25 DIAGNOSIS — J302 Other seasonal allergic rhinitis: Secondary | ICD-10-CM

## 2020-09-25 DIAGNOSIS — J439 Emphysema, unspecified: Secondary | ICD-10-CM

## 2020-09-25 MED ORDER — TIOTROPIUM BROMIDE-OLODATEROL 2.5-2.5 MCG/ACT IN AERS: 2.0000 | INHALATION_SPRAY | Freq: Every day | RESPIRATORY_TRACT | 3 refills | Status: DC

## 2020-09-25 MED ORDER — MONTELUKAST SODIUM 10 MG PO TABS: 10.0000 mg | ORAL_TABLET | Freq: Every day | ORAL | 3 refills | Status: DC

## 2020-10-17 ENCOUNTER — Other Ambulatory Visit: Payer: Medicare Other

## 2020-10-23 ENCOUNTER — Ambulatory Visit (INDEPENDENT_AMBULATORY_CARE_PROVIDER_SITE_OTHER): Payer: Medicare Other | Admitting: Family Medicine

## 2020-10-23 ENCOUNTER — Encounter: Payer: Self-pay | Admitting: Family Medicine

## 2020-10-23 ENCOUNTER — Other Ambulatory Visit: Payer: Self-pay

## 2020-10-23 VITALS — BP 178/78 | HR 61 | Ht 65.0 in | Wt 152.0 lb

## 2020-10-23 DIAGNOSIS — F331 Major depressive disorder, recurrent, moderate: Secondary | ICD-10-CM | POA: Diagnosis not present

## 2020-10-23 DIAGNOSIS — R053 Chronic cough: Secondary | ICD-10-CM | POA: Diagnosis not present

## 2020-10-23 DIAGNOSIS — Z23 Encounter for immunization: Secondary | ICD-10-CM | POA: Diagnosis not present

## 2020-10-23 DIAGNOSIS — J441 Chronic obstructive pulmonary disease with (acute) exacerbation: Secondary | ICD-10-CM | POA: Diagnosis not present

## 2020-10-23 DIAGNOSIS — I10 Essential (primary) hypertension: Secondary | ICD-10-CM

## 2020-10-23 DIAGNOSIS — F439 Reaction to severe stress, unspecified: Secondary | ICD-10-CM

## 2020-10-23 MED ORDER — FLUOXETINE HCL 20 MG PO TABS: 60.0000 mg | ORAL_TABLET | Freq: Every day | ORAL | 3 refills | Status: DC

## 2020-10-23 NOTE — Assessment & Plan Note (Addendum)
We discussed options I think she would do great with working with a therapist or counselor a lot of this is caregiver stress.  And also just setting some limits with family members.  We will go ahead and place referral for behavioral health today.  Continue fluoxetine we will try writing for 3 of the 20 mg tabs and see if the insurance will cover it that way.

## 2020-10-23 NOTE — Assessment & Plan Note (Signed)
Still have a persistent chronic cough at this point I would like to move forward with CT of the chest just to rule out any other underlying issue or lesions.
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
## 2020-10-23 NOTE — Patient Instructions (Signed)
Please call to schedule spirometry when you can.

## 2020-10-23 NOTE — Assessment & Plan Note (Signed)
Pressure was significantly elevated today.  We will try to have her come back in a couple weeks for nurse visit.

## 2020-10-24 ENCOUNTER — Ambulatory Visit (INDEPENDENT_AMBULATORY_CARE_PROVIDER_SITE_OTHER): Payer: Medicare Other

## 2020-10-24 DIAGNOSIS — J449 Chronic obstructive pulmonary disease, unspecified: Secondary | ICD-10-CM | POA: Diagnosis not present

## 2020-10-24 DIAGNOSIS — I700 Atherosclerosis of aorta: Secondary | ICD-10-CM | POA: Diagnosis not present

## 2020-10-24 DIAGNOSIS — J441 Chronic obstructive pulmonary disease with (acute) exacerbation: Secondary | ICD-10-CM

## 2020-10-24 DIAGNOSIS — R053 Chronic cough: Secondary | ICD-10-CM | POA: Diagnosis not present

## 2020-10-25 NOTE — Progress Notes (Signed)
HI Sherri [name], Sherri [name] news!  Your chest CT looks good.  No acute findings such as a mass or swollen lymph nodes which is fantastic.  You do have an old compression thoracic deformity in one of the vertebra.  Of course this is unrelated to your cough or COPD but may have caused some back pain at some point.

## 2020-11-02 ENCOUNTER — Other Ambulatory Visit: Payer: Self-pay

## 2020-11-02 ENCOUNTER — Encounter: Payer: Self-pay | Admitting: Family Medicine

## 2020-11-02 ENCOUNTER — Ambulatory Visit (INDEPENDENT_AMBULATORY_CARE_PROVIDER_SITE_OTHER): Payer: Medicare Other | Admitting: Family Medicine

## 2020-11-02 VITALS — BP 134/67 | HR 52 | Ht 65.0 in | Wt 146.0 lb

## 2020-11-02 DIAGNOSIS — J439 Emphysema, unspecified: Secondary | ICD-10-CM

## 2020-11-02 DIAGNOSIS — J984 Other disorders of lung: Secondary | ICD-10-CM

## 2020-11-02 DIAGNOSIS — J449 Chronic obstructive pulmonary disease, unspecified: Secondary | ICD-10-CM

## 2020-11-02 DIAGNOSIS — F17200 Nicotine dependence, unspecified, uncomplicated: Secondary | ICD-10-CM | POA: Diagnosis not present

## 2020-11-02 LAB — PULMONARY FUNCTION TEST

## 2020-11-02 MED ORDER — FLUOXETINE HCL 20 MG PO CAPS: 60.0000 mg | ORAL_CAPSULE | Freq: Every day | ORAL | 3 refills | Status: DC

## 2020-11-02 MED ORDER — ALBUTEROL SULFATE HFA 108 (90 BASE) MCG/ACT IN AERS
2.0000 | INHALATION_SPRAY | Freq: Once | RESPIRATORY_TRACT | Status: AC
  Administered 2020-11-02: 2 via RESPIRATORY_TRACT

## 2020-11-02 NOTE — Assessment & Plan Note (Signed)
Encouraged her to continue to just work on cessation I am really proud of her for cutting back to 6 cigarettes/day and just really encouraged her to keep the focus on quitting.

## 2020-11-02 NOTE — Assessment & Plan Note (Signed)
She has a prior diagnosis of pulmonary emphysema.  But today on her spirometry it is most consistent with restriction.  She certainly may have some element of obstruction as well.  FVC of 67%, FEV1 of 65% and a ratio of 73.  No significant improvement in FEV1 after albuterol.  We did discuss her efforts to cut back on smoking and just encouraged her to continue to work at it.  Also continue to use her inhaler regularly.  We also discussed maybe a trial of a PPI to see if this might also help with her chronic cough.  She could try this for 2 weeks to see if it helps.  But we did not see anything underlying or worrisome to cause her chronic cough besides her cigarettes and lung disease.

## 2020-11-02 NOTE — Progress Notes (Signed)
Established Patient Office Visit  Subjective:  Patient ID: Sherri [name], female    DOB: Jun 11, 1955  Age: 65 y.o. MRN: [id number]  CC:  Chief Complaint  Patient presents with   COPD    HPI Sherri [name] presents for spirometry for COPD.  She complained of a chronic cough when I last saw her.  We decided to move forward with CT of the chest as well as spirometry for further evaluation.  CT of chest with no worrisome findings.  She still has a chronic cough she says it is always worse first thing in the morning and then but gets better as the day goes on.  She is tried really hard to cut back so she is down to 6 cigarettes/day.  She also reports that she has been more consistent with using her inhalers and so has really tried to improve on that.  She does feel like the cough is a little better but it still persistent.  Usually a lot of phlegm and mucus associated with it  Past Medical History:  Diagnosis Date   Anxiety    Arthritis    Asthma    COPD (chronic obstructive pulmonary disease) ([place])    Depression    Pneumonia    Recurrent upper respiratory infection (URI)     Past Surgical History:  Procedure Laterality Date   ankle sx  1973   APPENDECTOMY     BREAST REDUCTION SURGERY  06/24/2011   Procedure: MAMMARY REDUCTION  (BREAST);  Surgeon: Cristine Polio, MD;  Location: [facility];  Service: Plastics;  Laterality: Bilateral;   CERVICAL DISC SURGERY     (? laminectomy?)   CESAREAN SECTION  1976   left groin hernia  1974   REDUCTION MAMMAPLASTY     TONSILLECTOMY      Family History  Problem Relation Age of Onset   Hypertension Mother    Depression Mother    Alzheimer's disease Mother    Diverticulitis Mother    Alcohol abuse Father    Diabetes Father    Cancer Other        pancreatic   Pancreatic cancer Maternal Grandmother    Colon cancer Neg Hx    Colon polyps Neg Hx    Esophageal cancer Neg Hx    Rectal cancer Neg Hx    Stomach  cancer Neg Hx     Social History   Socioeconomic History   Marital status: Married    Spouse name: Not on file   Number of children: Not on file   Years of education: Not on file   Highest education level: Not on file  Occupational History   Not on file  Tobacco Use   Smoking status: Every Day    Packs/day: 0.50    Years: 35.00    Pack years: 17.50    Types: Cigarettes   Smokeless tobacco: Never  Vaping Use   Vaping Use: Never used  Substance and Sexual Activity   Alcohol use: No    Alcohol/week: 0.0 standard drinks   Drug use: No   Sexual activity: Not on file  Other Topics Concern   Not on file  Social History Narrative   Not on file   Social Determinants of Health   Financial Resource Strain: Not on file  Food Insecurity: Not on file  Transportation Needs: Not on file  Physical Activity: Not on file  Stress: Not on file  Social Connections: Not on file  Intimate Partner  Violence: Not on file    Outpatient Medications Prior to Visit  Medication Sig Dispense Refill   albuterol (VENTOLIN HFA) 108 (90 Base) MCG/ACT inhaler TAKE 2 PUFFS BY MOUTH EVERY 6 HOURS AS NEEDED FOR WHEEZE OR SHORTNESS OF BREATH 8.5 each 6   atorvastatin (LIPITOR) 40 MG tablet Take 1 tablet (40 mg total) by mouth at bedtime. 90 tablet 3   gabapentin (NEURONTIN) 300 MG capsule 2 capsules in the morning and 3 capsules at night 450 capsule 3   hydrochlorothiazide (HYDRODIURIL) 25 MG tablet TAKE 1 TABLET BY MOUTH EVERY DAY 90 tablet 1   montelukast (SINGULAIR) 10 MG tablet Take 1 tablet (10 mg total) by mouth at bedtime. 90 tablet 3   Tiotropium Bromide-Olodaterol 2.5-2.5 MCG/ACT AERS Inhale 2 puffs into the lungs daily. 4 g 3   FLUoxetine (PROZAC) 20 MG tablet Take 3 tablets (60 mg total) by mouth daily. 270 tablet 3   No facility-administered medications prior to visit.    Allergies  Allergen Reactions   Hydrocodone Nausea And Vomiting   Penicillins Hives    REACTION: hives     ROS Review of Systems    Objective:    Physical Exam Constitutional:      Appearance: Normal appearance. She is well-developed.  HENT:     Head: Normocephalic and atraumatic.  Cardiovascular:     Rate and Rhythm: Normal rate and regular rhythm.     Heart sounds: Normal heart sounds.  Pulmonary:     Effort: Pulmonary effort is normal.     Breath sounds: Normal breath sounds.  Skin:    General: Skin is warm and dry.  Neurological:     Mental Status: She is alert and oriented to person, place, and time.  Psychiatric:        Behavior: Behavior normal.    BP 134/67   Pulse (!) 52   Ht 5\' 5"  (1.651 m)   Wt 146 lb (66.2 kg)   SpO2 96%   BMI 24.30 kg/m  Wt Readings from Last 3 Encounters:  11/02/20 146 lb (66.2 kg)  10/23/20 152 lb (68.9 kg)  09/25/20 145 lb 11.2 oz (66.1 kg)     Health Maintenance Due  Topic Date Due   DEXA SCAN  Never done   COVID-19 Vaccine (3 - Booster for Pfizer series) 07/09/2019    There are no preventive care reminders to display for this patient.  Lab Results  Component Value Date   TSH 2.45 06/07/2020   Lab Results  Component Value Date   WBC 5.4 06/07/2020   HGB 15.3 06/07/2020   HCT 45.5 (H) 06/07/2020   MCV 99.1 06/07/2020   PLT 310 06/07/2020   Lab Results  Component Value Date   NA 141 06/07/2020   K 5.0 06/07/2020   CO2 31 06/07/2020   GLUCOSE 105 (H) 06/07/2020   BUN 8 06/07/2020   CREATININE 0.71 06/07/2020   BILITOT 0.4 06/07/2020   ALKPHOS 120 10/18/2015   AST 22 06/07/2020   ALT 12 06/07/2020   PROT 6.5 06/07/2020   ALBUMIN 4.2 10/18/2015   CALCIUM 9.5 06/07/2020   Lab Results  Component Value Date   CHOL 276 (H) 06/07/2020   Lab Results  Component Value Date   HDL 66 06/07/2020   Lab Results  Component Value Date   LDLCALC 187 (H) 06/07/2020   Lab Results  Component Value Date   TRIG 106 06/07/2020   Lab Results  Component Value Date   CHOLHDL  4.2 06/07/2020   No results found for:  HGBA1C    Assessment & Plan:   Problem List Items Addressed This Visit       Respiratory   Restrictive lung disease   Pulmonary emphysema ([place])    She has a prior diagnosis of pulmonary emphysema.  But today on her spirometry it is most consistent with restriction.  She certainly may have some element of obstruction as well.  FVC of 67%, FEV1 of 65% and a ratio of 73.  No significant improvement in FEV1 after albuterol.  We did discuss her efforts to cut back on smoking and just encouraged her to continue to work at it.  Also continue to use her inhaler regularly.  We also discussed maybe a trial of a PPI to see if this might also help with her chronic cough.  She could try this for 2 weeks to see if it helps.  But we did not see anything underlying or worrisome to cause her chronic cough besides her cigarettes and lung disease.        Other   TOBACCO DEPENDENCE    Encouraged her to continue to just work on cessation I am really proud of her for cutting back to 6 cigarettes/day and just really encouraged her to keep the focus on quitting.      Other Visit Diagnoses     Chronic obstructive pulmonary disease, unspecified COPD type ([place])    -  Primary   Relevant Medications   albuterol (VENTOLIN HFA) 108 (90 Base) MCG/ACT inhaler 2 puff (Completed)   Other Relevant Orders   PR EVAL OF BRONCHOSPASM       Meds ordered this encounter  Medications   albuterol (VENTOLIN HFA) 108 (90 Base) MCG/ACT inhaler 2 puff   FLUoxetine (PROZAC) 20 MG capsule    Sig: Take 3 capsules (60 mg total) by mouth daily.    Dispense:  90 capsule    Refill:  3    Follow-up: No follow-ups on file.    Beatrice Lecher, MD

## 2020-12-01 ENCOUNTER — Other Ambulatory Visit: Payer: Self-pay | Admitting: Family Medicine

## 2020-12-15 ENCOUNTER — Other Ambulatory Visit: Payer: Self-pay | Admitting: Family Medicine

## 2020-12-25 ENCOUNTER — Other Ambulatory Visit: Payer: Self-pay | Admitting: Sports Medicine

## 2020-12-25 ENCOUNTER — Telehealth: Payer: Self-pay

## 2020-12-25 DIAGNOSIS — Q761 Klippel-Feil syndrome: Secondary | ICD-10-CM

## 2020-12-25 NOTE — Telephone Encounter (Signed)
Per Vivia Ewing: [id number] [name], Sherri is needing Gabpentin verified with pharmacy so she can get a refill  Called [facility] on [address] and they said a PA was not needed, just refill authorizations. Per pts chart, a refill was sent in today. Pt aware

## 2021-01-09 ENCOUNTER — Ambulatory Visit (INDEPENDENT_AMBULATORY_CARE_PROVIDER_SITE_OTHER): Payer: Medicare Other | Admitting: Family Medicine

## 2021-01-09 ENCOUNTER — Other Ambulatory Visit: Payer: Self-pay

## 2021-01-09 ENCOUNTER — Encounter: Payer: Self-pay | Admitting: Family Medicine

## 2021-01-09 VITALS — BP 126/87 | HR 64 | Temp 97.8°F | Resp 18 | Ht 65.0 in

## 2021-01-09 DIAGNOSIS — J189 Pneumonia, unspecified organism: Secondary | ICD-10-CM | POA: Diagnosis not present

## 2021-01-09 DIAGNOSIS — J441 Chronic obstructive pulmonary disease with (acute) exacerbation: Secondary | ICD-10-CM

## 2021-01-09 DIAGNOSIS — R051 Acute cough: Secondary | ICD-10-CM | POA: Diagnosis not present

## 2021-01-09 LAB — POCT INFLUENZA A/B
Influenza A, POC: NEGATIVE
Influenza B, POC: NEGATIVE

## 2021-01-09 MED ORDER — PREDNISONE 20 MG PO TABS: 40.0000 mg | ORAL_TABLET | Freq: Every day | ORAL | 0 refills | Status: DC

## 2021-01-09 MED ORDER — CEFUROXIME AXETIL 500 MG PO TABS: 500.0000 mg | ORAL_TABLET | Freq: Two times a day (BID) | ORAL | 0 refills | Status: AC

## 2021-01-09 MED ORDER — AZITHROMYCIN 250 MG PO TABS: ORAL_TABLET | ORAL | 0 refills | Status: AC

## 2021-01-09 NOTE — Progress Notes (Signed)
Acute Office Visit  Subjective:    Patient ID: Sherri [name], female    DOB: 03-28-1955, 65 y.o.   MRN: [id number]  Chief Complaint  Patient presents with   nasal congestion    Productive cough, Chest congestion and ear fullness, 3 days. Covid test negative on 01/08/21    HPI Patient is in today for cough and chest congestion x3 days.  Started with some bilateral ear fullness.  Then became congested and then moved into her chest.  She did have a negative COVID test yesterday on the 26th.  No GI symptoms.  She has had a mild sore throat.  No fevers chills or sweats. Ears are ringing now.  Right posterior back hurts.  No GI symptoms.  Past Medical History:  Diagnosis Date   Anxiety    Arthritis    Asthma    COPD (chronic obstructive pulmonary disease) ([place])    Depression    Pneumonia    Recurrent upper respiratory infection (URI)     Past Surgical History:  Procedure Laterality Date   ankle sx  1973   APPENDECTOMY     BREAST REDUCTION SURGERY  06/24/2011   Procedure: MAMMARY REDUCTION  (BREAST);  Surgeon: Cristine Polio, MD;  Location: [facility];  Service: Plastics;  Laterality: Bilateral;   CERVICAL DISC SURGERY     (? laminectomy?)   CESAREAN SECTION  1976   left groin hernia  1974   REDUCTION MAMMAPLASTY     TONSILLECTOMY      Family History  Problem Relation Age of Onset   Hypertension Mother    Depression Mother    Alzheimer's disease Mother    Diverticulitis Mother    Alcohol abuse Father    Diabetes Father    Cancer Other        pancreatic   Pancreatic cancer Maternal Grandmother    Colon cancer Neg Hx    Colon polyps Neg Hx    Esophageal cancer Neg Hx    Rectal cancer Neg Hx    Stomach cancer Neg Hx     Social History   Socioeconomic History   Marital status: Married    Spouse name: Not on file   Number of children: Not on file   Years of education: Not on file   Highest education level: Not on file  Occupational History    Not on file  Tobacco Use   Smoking status: Every Day    Packs/day: 0.50    Years: 35.00    Pack years: 17.50    Types: Cigarettes   Smokeless tobacco: Current   Tobacco comments:    5 cigarettes day. Vape as well  Vaping Use   Vaping Use: Never used  Substance and Sexual Activity   Alcohol use: No    Alcohol/week: 0.0 standard drinks   Drug use: No   Sexual activity: Not on file  Other Topics Concern   Not on file  Social History Narrative   Not on file   Social Determinants of Health   Financial Resource Strain: Not on file  Food Insecurity: Not on file  Transportation Needs: Not on file  Physical Activity: Not on file  Stress: Not on file  Social Connections: Not on file  Intimate Partner Violence: Not on file    Outpatient Medications Prior to Visit  Medication Sig Dispense Refill   albuterol (VENTOLIN HFA) 108 (90 Base) MCG/ACT inhaler TAKE 2 PUFFS BY MOUTH EVERY 6 HOURS AS NEEDED FOR  WHEEZE OR SHORTNESS OF BREATH 8.5 each 6   atorvastatin (LIPITOR) 40 MG tablet Take 1 tablet (40 mg total) by mouth at bedtime. 90 tablet 3   FLUoxetine (PROZAC) 20 MG capsule Take 3 capsules (60 mg total) by mouth daily. 90 capsule 3   gabapentin (NEURONTIN) 300 MG capsule 2 CAPSULES IN THE MORNING AND 3 CAPSULES AT NIGHT 450 capsule 3   hydrochlorothiazide (HYDRODIURIL) 25 MG tablet TAKE 1 TABLET BY MOUTH EVERY DAY 90 tablet 1   montelukast (SINGULAIR) 10 MG tablet Take 1 tablet (10 mg total) by mouth at bedtime. 90 tablet 3   Tiotropium Bromide-Olodaterol 2.5-2.5 MCG/ACT AERS Inhale 2 puffs into the lungs daily. 4 g 3   No facility-administered medications prior to visit.    Allergies  Allergen Reactions   Hydrocodone Nausea And Vomiting   Penicillins Hives    REACTION: hives    Review of Systems     Objective:    Physical Exam Constitutional:      Appearance: She is well-developed.  HENT:     Head: Normocephalic and atraumatic.     Right Ear: External ear normal.      Left Ear: External ear normal.     Nose: Nose normal.  Eyes:     Conjunctiva/sclera: Conjunctivae normal.     Pupils: Pupils are equal, round, and reactive to light.  Neck:     Thyroid: No thyromegaly.  Cardiovascular:     Rate and Rhythm: Normal rate and regular rhythm.     Heart sounds: Normal heart sounds.  Pulmonary:     Effort: Pulmonary effort is normal.     Breath sounds: No wheezing.     Comments: Expiratory wheezing in the left upper lung, prolonged expiration diffusely, significant rhonchi much louder on the right compared to the left. Musculoskeletal:     Cervical back: Neck supple.  Lymphadenopathy:     Cervical: No cervical adenopathy.  Skin:    General: Skin is warm and dry.  Neurological:     Mental Status: She is alert and oriented to person, place, and time.    BP 126/87    Pulse 64    Temp 97.8 F (36.6 C)    Resp 18    Ht 5\' 5"  (1.651 m)    SpO2 98%    BMI 24.30 kg/m  Wt Readings from Last 3 Encounters:  11/02/20 146 lb (66.2 kg)  10/23/20 152 lb (68.9 kg)  09/25/20 145 lb 11.2 oz (66.1 kg)    Health Maintenance Due  Topic Date Due   Zoster Vaccines- Shingrix (1 of 2) Never done   DEXA SCAN  Never done   COVID-19 Vaccine (3 - Booster for Pfizer series) 07/09/2019    There are no preventive care reminders to display for this patient.   Lab Results  Component Value Date   TSH 2.45 06/07/2020   Lab Results  Component Value Date   WBC 5.4 06/07/2020   HGB 15.3 06/07/2020   HCT 45.5 (H) 06/07/2020   MCV 99.1 06/07/2020   PLT 310 06/07/2020   Lab Results  Component Value Date   NA 141 06/07/2020   K 5.0 06/07/2020   CO2 31 06/07/2020   GLUCOSE 105 (H) 06/07/2020   BUN 8 06/07/2020   CREATININE 0.71 06/07/2020   BILITOT 0.4 06/07/2020   ALKPHOS 120 10/18/2015   AST 22 06/07/2020   ALT 12 06/07/2020   PROT 6.5 06/07/2020   ALBUMIN 4.2 10/18/2015   CALCIUM  9.5 06/07/2020   Lab Results  Component Value Date   CHOL 276 (H)  06/07/2020   Lab Results  Component Value Date   HDL 66 06/07/2020   Lab Results  Component Value Date   LDLCALC 187 (H) 06/07/2020   Lab Results  Component Value Date   TRIG 106 06/07/2020   Lab Results  Component Value Date   CHOLHDL 4.2 06/07/2020   No results found for: HGBA1C     Assessment & Plan:   Problem List Items Addressed This Visit   None Visit Diagnoses     COPD exacerbation ([place])    -  Primary   Relevant Medications   azithromycin (ZITHROMAX) 250 MG tablet   predniSONE (DELTASONE) 20 MG tablet   Acute cough       Relevant Orders   POCT Influenza A/B (Completed)   Pneumonia of right lower lobe due to infectious organism       Relevant Medications   azithromycin (ZITHROMAX) 250 MG tablet   cefUROXime (CEFTIN) 500 MG tablet      Based on clinical exam today I strongly suspect that she has right lower lobe pneumonia.  We will go ahead and treat with azithromycin and cefuroxime in addition to prednisone since she also has underlying COPD and I do feel like it is triggering a COPD exacerbation for her.  Oxygen level is okay today.  If she is not improving in the next 2 to 3 days I would like to get a chest x-ray for further work-up she can give [place] a call back.  Negative for flu.  Meds ordered this encounter  Medications   azithromycin (ZITHROMAX) 250 MG tablet    Sig: 2 Ttabs PO on Day 1, then one a day x 4 days.    Dispense:  6 tablet    Refill:  0   cefUROXime (CEFTIN) 500 MG tablet    Sig: Take 1 tablet (500 mg total) by mouth 2 (two) times daily with a meal for 10 days.    Dispense:  20 tablet    Refill:  0   predniSONE (DELTASONE) 20 MG tablet    Sig: Take 2 tablets (40 mg total) by mouth daily with breakfast.    Dispense:  10 tablet    Refill:  0     Beatrice Lecher, MD

## 2021-03-09 ENCOUNTER — Ambulatory Visit (INDEPENDENT_AMBULATORY_CARE_PROVIDER_SITE_OTHER): Payer: Medicare Other | Admitting: Sports Medicine

## 2021-03-09 ENCOUNTER — Other Ambulatory Visit: Payer: Self-pay

## 2021-03-09 ENCOUNTER — Ambulatory Visit (INDEPENDENT_AMBULATORY_CARE_PROVIDER_SITE_OTHER): Payer: Medicare Other

## 2021-03-09 DIAGNOSIS — Q761 Klippel-Feil syndrome: Secondary | ICD-10-CM

## 2021-03-09 DIAGNOSIS — M545 Low back pain, unspecified: Secondary | ICD-10-CM

## 2021-03-09 DIAGNOSIS — M542 Cervicalgia: Secondary | ICD-10-CM | POA: Diagnosis not present

## 2021-03-09 DIAGNOSIS — M5136 Other intervertebral disc degeneration, lumbar region: Secondary | ICD-10-CM | POA: Diagnosis not present

## 2021-03-09 DIAGNOSIS — R200 Anesthesia of skin: Secondary | ICD-10-CM | POA: Insufficient documentation

## 2021-03-09 DIAGNOSIS — M51369 Other intervertebral disc degeneration, lumbar region without mention of lumbar back pain or lower extremity pain: Secondary | ICD-10-CM

## 2021-03-09 MED ORDER — CYCLOBENZAPRINE HCL 10 MG PO TABS
ORAL_TABLET | ORAL | 0 refills | Status: DC
Start: 2021-03-09 — End: 2021-04-23

## 2021-03-09 MED ORDER — PREDNISONE 50 MG PO TABS: ORAL_TABLET | ORAL | 0 refills | Status: DC

## 2021-03-09 NOTE — Assessment & Plan Note (Addendum)
Likely multifactorial though her exam is more suggestive of carpal tunnel syndrome, she also has symptoms predominantly at night which is classic with carpal tunnel syndrome. Positive Tinel's and Phalen signs on exam today, she is dropping things. She will do bilateral nighttime splinting, she has splints at home already, adding some therapy for this, bilateral median nerve hydrodissections if not better in 6 weeks.

## 2021-03-09 NOTE — Progress Notes (Signed)
° ° °  Procedures performed today:    None.  Independent interpretation of notes and tests performed by another provider:   None.  Brief History, Exam, Impression, and Recommendations:    Lumbar degenerative disc disease This is a very pleasant 66 year old female, she is long history of axial low back pain, more recently increased bilaterally, low back with radiation down the back of the left leg to the side of the knee but not past. Nothing overtly radicular all the way down to the foot or toes. Back pain is worse with sitting, flexion, Valsalva. No red flag symptoms, started conservatively with prednisone, Flexeril at night, formal PT. X-rays. Return to see me in 6 weeks, MRI for interventional planning if no better.  Cervical fusion syndrome She also has a history of a C4-C6 fusion by Dr. Trenton Gammon with adjacent level disease, historically gabapentin at 900 mg twice daily has helped. Getting updated x-rays, adding some additional therapy, if insufficient improvement we will proceed with additional MRI and epidural.  Bilateral hand numbness Likely multifactorial though her exam is more suggestive of carpal tunnel syndrome, she also has symptoms predominantly at night which is classic with carpal tunnel syndrome. Positive Tinel's and Phalen signs on exam today, she is dropping things. She will do bilateral nighttime splinting, she has splints at home already, adding some therapy for this, bilateral median nerve hydrodissections if not better in 6 weeks.    ___________________________________________ Gwen Her. Dianah Field, M.D., ABFM., CAQSM. Primary Care and [place] Instructor of [place] [employer] [facility]

## 2021-03-09 NOTE — Assessment & Plan Note (Signed)
This is a very pleasant 66 year old female, she is long history of axial low back pain, more recently increased bilaterally, low back with radiation down the back of the left leg to the side of the knee but not past. Nothing overtly radicular all the way down to the foot or toes. Back pain is worse with sitting, flexion, Valsalva. No red flag symptoms, started conservatively with prednisone, Flexeril at night, formal PT. X-rays. Return to see me in 6 weeks, MRI for interventional planning if no better.

## 2021-03-09 NOTE — Assessment & Plan Note (Signed)
She also has a history of a C4-C6 fusion by Dr. Trenton Gammon with adjacent level disease, historically gabapentin at 900 mg twice daily has helped. Getting updated x-rays, adding some additional therapy, if insufficient improvement we will proceed with additional MRI and epidural.

## 2021-03-15 ENCOUNTER — Encounter: Payer: Self-pay | Admitting: Rehabilitative and Restorative Service Providers"

## 2021-03-15 ENCOUNTER — Ambulatory Visit: Payer: Medicare Other | Attending: Sports Medicine | Admitting: Rehabilitative and Restorative Service Providers"

## 2021-03-15 ENCOUNTER — Other Ambulatory Visit: Payer: Self-pay

## 2021-03-15 DIAGNOSIS — R29898 Other symptoms and signs involving the musculoskeletal system: Secondary | ICD-10-CM | POA: Diagnosis present

## 2021-03-15 DIAGNOSIS — M539 Dorsopathy, unspecified: Secondary | ICD-10-CM | POA: Insufficient documentation

## 2021-03-15 DIAGNOSIS — M5442 Lumbago with sciatica, left side: Secondary | ICD-10-CM | POA: Diagnosis present

## 2021-03-15 DIAGNOSIS — M6281 Muscle weakness (generalized): Secondary | ICD-10-CM | POA: Diagnosis present

## 2021-03-15 DIAGNOSIS — M5136 Other intervertebral disc degeneration, lumbar region: Secondary | ICD-10-CM | POA: Insufficient documentation

## 2021-03-15 NOTE — Therapy (Signed)
[facility] [address] [place], [place], [address] Phone: [phone]   Fax:  [phone]  Physical Therapy Evaluation  Patient Details  Name: Sherri [name] MRN: [id number] Date of Birth: 1955/06/14 Referring Provider (PT): Dr Dianah Field   Encounter Date: 03/15/2021   PT End of Session - 03/15/21 1459     Visit Number 1    Number of Visits 12    Date for PT Re-Evaluation 04/26/21    PT Start Time 1017    PT Stop Time 1110    PT Time Calculation (min) 53 min    Activity Tolerance Patient tolerated treatment well;Patient limited by pain             Past Medical History:  Diagnosis Date   Anxiety    Arthritis    Asthma    COPD (chronic obstructive pulmonary disease) ([place])    Depression    Pneumonia    Recurrent upper respiratory infection (URI)     Past Surgical History:  Procedure Laterality Date   ankle sx  1973   APPENDECTOMY     BREAST REDUCTION SURGERY  06/24/2011   Procedure: MAMMARY REDUCTION  (BREAST);  Surgeon: Cristine Polio, MD;  Location: [facility];  Service: Plastics;  Laterality: Bilateral;   CERVICAL DISC SURGERY     (? laminectomy?)   CESAREAN SECTION  1976   left groin hernia  1974   REDUCTION MAMMAPLASTY     TONSILLECTOMY      There were no vitals filed for this visit.    Subjective Assessment - 03/15/21 1022     Subjective Patient reports that she has had LBP at age 6 and she has had trouble on and off over the years. She has had increased LBP and pain into the posterior Lt LE over the past 2-3 weeks. She had cervical disc surgery ~ 10 years ago and has had increased pain in the past two years in the neck with popping often.    Patient Stated Goals help decrease pain so she can be more mobile    Currently in Pain? Yes    Pain Score 4     Pain Location Back    Pain Orientation Left;Right    Pain Descriptors / Indicators Sharp    Pain Type Acute  pain;Chronic pain    Pain Radiating Towards posterior Lt hip into thigh    Pain Onset 1 to 4 weeks ago    Pain Frequency Intermittent    Aggravating Factors  sitting; reaching; bending; lifting    Pain Relieving Factors standing; meds                [facility] PT Assessment - 03/15/21 0001       Assessment   Medical Diagnosis Lumbar DDD; cervical dysfunction    Referring Provider (PT) Dr Dianah Field    Onset Date/Surgical Date 02/23/21    Hand Dominance Right    Next MD Visit 04/23/21    Prior Therapy after cervical disc surgery x 2 weeks      Precautions   Precautions None      Restrictions   Weight Bearing Restrictions No      Balance Screen   Has the patient fallen in the past 6 months Yes    How many times? 3    Has the patient had a decrease in activity level because of a fear of falling?  No    Is the patient reluctant to leave  their home because of a fear of falling?  No      Prior Function   Level of Independence Independent    Vocation Retired    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, counselling    Leisure household chores; cooking; sedentary; cares for mom in nursing home      Observation/Other Assessments   Focus on Therapeutic Outcomes (FOTO)  40      Sensation   Additional Comments numbness and tingling in bilat hands when lying flat on her back      Posture/Postural Control   Posture Comments head forward; shoulders rounded; valgus knees      AROM   Cervical Flexion 50    Cervical Extension 50 discomfort in lower neck area    Cervical - Right Side Bend 32    Cervical - Left Side Bend 32    Cervical - Right Rotation 52 tight    Cervical - Left Rotation 47 tight    Lumbar Flexion 50% increased LBP    Lumbar Extension 40%    Lumbar - Right Side Bend 80%    Lumbar - Left Side Bend 70% pulling in LB    Lumbar - Right Rotation 30%    Lumbar - Left Rotation 35% pulling LB      Strength   Overall Strength Comments  functional strength - not assessed resistively      Flexibility   Hamstrings tight and painful Lt ~ 60 deg; tight Rt ~ 75 deg    Quadriceps WFL's bilat    ITB tight Lt    Piriformis tight and painful Lt; WFL's Rt      Palpation   Spinal mobility hypomobile SI; L5; L4    Palpation comment tight and painful Lt psoas; posterior hip through the piriformis and gluts      Special Tests   Other special tests (+) SLR Rt ~ 45 deg with increased pain in the Lt posterior thigh                        Objective measurements completed on examination: See above findings.       [facility] Adult PT Treatment/Exercise - 03/15/21 0001       Lumbar Exercises: Stretches   Passive Hamstring Stretch Left;2 reps;30 seconds    Passive Hamstring Stretch Limitations supine with strap    Press Ups 5 reps    Press Ups Limitations 2-3 sec hold - to pt tolerance no pain    Piriformis Stretch Left;2 reps;30 seconds    Piriformis Stretch Limitations supine with strap travell      Cryotherapy   Number Minutes Cryotherapy 15 Minutes    Cryotherapy Location Lumbar Spine;Hip    Type of Cryotherapy Ice pack      Electrical Stimulation   Electrical Stimulation Location Lt LB/posterior hip    Electrical Stimulation Action TENs    Electrical Stimulation Parameters to tolerance    Electrical Stimulation Goals Pain;Tone                     PT Education - 03/15/21 1455     Education Details POC; HEP; TENS    Person(s) Educated Patient    Methods Explanation;Demonstration;Tactile cues;Verbal cues;Handout    Comprehension Verbalized understanding;Returned demonstration;Verbal cues required;Tactile cues required                 PT Long Term Goals - 03/15/21 1507  PT LONG TERM GOAL #1   Title Decrease LBP ad Lt LE pain by 50-70% allowing patient to increase activity level    Time 6    Period Weeks    Status New    Target Date 04/26/21      PT LONG TERM GOAL #2    Title Increase trunk mobility and ROM allowing patient to move from sit to stand and perform mini squat to participate in functional activities and ADL's with greater ease    Time 6    Period Weeks    Status New    Target Date 04/26/21      PT LONG TERM GOAL #3   Title Patient to demonstrate and verbalize proper transitional and transfer techniques for back care    Time 6    Period Weeks    Status New    Target Date 04/26/21      PT LONG TERM GOAL #4   Title Independent in HEP    Time 6    Period Weeks    Status New    Target Date 04/26/21      PT LONG TERM GOAL #5   Title Improve functional limitation score to 61    Time 6    Period Weeks    Status New    Target Date 04/26/21                    Plan - 03/15/21 1500     Clinical Impression Statement Patient presents wtih 2-3 week history of increased LBP with no known cause for exacerbation. She has a history of LBP over the past 50 years with intermittent flare ups. Patient has a history of cervical dysfunction for several years undergoing a cervical disc fusion ~ 10 years ago. She has experienced increased symptoms in the cervical area including "popping" and discomfort. Patient has poor posture and alignment; limited lumbar and cervical ROM/mobility; decreased core and LE strength; decreased functional activity tolerance; pain limiting functional activity and ADLs. Patient will benefit from PT to address problems identified.    Personal Factors and Comorbidities Age;Fitness;Comorbidity 1;Past/Current Experience;Time since onset of injury/illness/exacerbation    Comorbidities arthritis; prior surgery; HTN; back and neck pain; sedentary lifestyle    Examination-Activity Limitations Locomotion Level;Transfers;Reach Overhead;Bend;Carry;Squat;Sleep;Stand;Lift    Examination-Participation Restrictions Cleaning;Laundry;Shop    Stability/Clinical Decision Making Evolving/Moderate complexity    Clinical Decision Making  Moderate    Rehab Potential Good    PT Frequency 2x / week    PT Duration 6 weeks    PT Treatment/Interventions ADLs/Self Care Home Management;Aquatic Therapy;Cryotherapy;Electrical Stimulation;Iontophoresis 4mg /ml Dexamethasone;Moist Heat;Traction;Ultrasound;Gait training;Stair training;Functional mobility training;Therapeutic activities;Therapeutic exercise;Balance training;Neuromuscular re-education;Patient/family education;Manual techniques;Dry needling;Taping    PT Next Visit Plan review and progress HEP - add core stabilization; myofacial ball release work; postural correction; chin tuck/scap squeeze; posterior shoudler girdle strengthening; manual work/DN and modalities as indicated    PT Home Exercise Plan [email or web address]    Consulted and Agree with Plan of Care Patient             Patient will benefit from skilled therapeutic intervention in order to improve the following deficits and impairments:  Abnormal gait, Decreased range of motion, Decreased activity tolerance, Pain, Hypomobility, Impaired flexibility, Improper body mechanics, Decreased balance, Decreased mobility, Decreased strength, Postural dysfunction  Visit Diagnosis: Acute left-sided low back pain with left-sided sciatica  Cervical dysfunction  Other symptoms and signs involving the musculoskeletal system  Muscle weakness (generalized)     Problem List Patient  Active Problem List   Diagnosis Date Noted   Bilateral hand numbness 03/09/2021   Restrictive lung disease 11/02/2020   Benign paroxysmal positional vertigo, left 08/18/2020   Hyperlipidemia with target LDL less than 100 07/10/2020   Abnormal weight loss 07/10/2020   Pulmonary emphysema ([place]) 06/09/2020   Cervical fusion syndrome 09/29/2019   Cyst of finger of right hand 07/12/2019   Stress 06/28/2019   Essential hypertension 02/25/2019   Chronic diarrhea 01/21/2019   Chondromalacia of patella, left 03/09/2013   UNSPECIFIED VENOUS INSUFFICIENCY  09/13/2009   Lumbar degenerative disc disease 09/13/2009   CHRONIC OBSTRUCTIVE PULMONARY DISEASE, ACUTE EXACERBATION 06/15/2009   FATIGUE 10/11/2008   ALLERGIC RHINITIS 03/21/2008   MDD (major depressive disorder) 10/22/2005   Panic attack 10/22/2005   TOBACCO DEPENDENCE 10/22/2005   OSTEOARTHRITIS, UNSPECIFIED 10/22/2005    Jaylei Fuerte Nilda Simmer, PT, MPH 03/15/2021, 3:12 PM  [facility] [address] [place], [place], [address] Phone: [phone]   Fax:  [phone]  Name: VERONDA GABOR MRN: [id number] Date of Birth: 07-Feb-1955

## 2021-03-15 NOTE — Patient Instructions (Signed)
Access Code: [email or web address] ?URL: [email or web address] ?Date: 03/15/2021 ?Prepared by: Gillermo Murdoch ? ?Exercises ?Prone Press Up - 2 x daily - 7 x weekly - 1 sets - 10 reps - 2-3 sec hold ?Supine Piriformis Stretch with Leg Straight - 2 x daily - 7 x weekly - 1 sets - 3 reps - 30 sec hold ?Hooklying Hamstring Stretch with Strap - 2 x daily - 7 x weekly - 1 sets - 3 reps - 30 sec hold ?Seated Hamstring Stretch - 2 x daily - 7 x weekly - 1 sets - 3 reps - 30 sec hold ? ?Patient Education ?Posture and Body Mechanics ?TENS Unit ?Trigger Point Dry Needling ?

## 2021-03-19 ENCOUNTER — Ambulatory Visit: Payer: Medicare Other | Admitting: Physical Therapy

## 2021-03-21 ENCOUNTER — Ambulatory Visit: Payer: Medicare Other | Admitting: Rehabilitative and Restorative Service Providers"

## 2021-03-28 ENCOUNTER — Other Ambulatory Visit: Payer: Self-pay

## 2021-03-28 ENCOUNTER — Encounter: Payer: Self-pay | Admitting: Rehabilitative and Restorative Service Providers"

## 2021-03-28 ENCOUNTER — Ambulatory Visit: Payer: Medicare Other | Admitting: Rehabilitative and Restorative Service Providers"

## 2021-03-28 DIAGNOSIS — M5442 Lumbago with sciatica, left side: Secondary | ICD-10-CM

## 2021-03-28 DIAGNOSIS — R29898 Other symptoms and signs involving the musculoskeletal system: Secondary | ICD-10-CM

## 2021-03-28 DIAGNOSIS — M539 Dorsopathy, unspecified: Secondary | ICD-10-CM

## 2021-03-28 DIAGNOSIS — M5136 Other intervertebral disc degeneration, lumbar region: Secondary | ICD-10-CM | POA: Diagnosis not present

## 2021-03-28 DIAGNOSIS — M6281 Muscle weakness (generalized): Secondary | ICD-10-CM | POA: Diagnosis not present

## 2021-03-28 NOTE — Patient Instructions (Signed)
Access Code: [email or web address] ?URL: [email or web address] ?Date: 03/28/2021 ?Prepared by: Gillermo Murdoch ? ?Exercises ?Prone Press Up - 2 x daily - 7 x weekly - 1 sets - 10 reps - 2-3 sec hold ?Supine Piriformis Stretch with Leg Straight - 2 x daily - 7 x weekly - 1 sets - 3 reps - 30 sec hold ?Hooklying Hamstring Stretch with Strap - 2 x daily - 7 x weekly - 1 sets - 3 reps - 30 sec hold ?Seated Hamstring Stretch - 2 x daily - 7 x weekly - 1 sets - 3 reps - 30 sec hold ?Sit to Stand - 2 x daily - 7 x weekly - 1 sets - 10 reps - 3-5 sec hold ?Standing Bilateral Low Shoulder Row with Anchored Resistance - 2 x daily - 7 x weekly - 1-3 sets - 10 reps - 2-3 sec hold ?Shoulder Extension with Resistance - 2 x daily - 7 x weekly - 1-3 sets - 10 reps - 2-3 sec hold ?Wall Quarter Squat - 2 x daily - 7 x weekly - 1-2 sets - 10 reps - 5-10 sec hold ? ?

## 2021-03-28 NOTE — Therapy (Signed)
[facility] [place] ?[facility] ?[place] ?[place], [place], [address] ?Phone: [phone]   Fax:  [phone] ? ?Physical Therapy Treatment ? ?Patient Details  ?Name: Sherri [name] ?MRN: [id number] ?Date of Birth: July 20, 1955 ?Referring Provider (PT): Dr Dianah Field ? ? ?Encounter Date: 03/28/2021 ? ? PT End of Session - 03/28/21 1100   ? ? Visit Number 2   ? Number of Visits 12   ? Date for PT Re-Evaluation 04/26/21   ? PT Start Time 1100   ? PT Stop Time 1148   ? PT Time Calculation (min) 48 min   ? Activity Tolerance Patient tolerated treatment well;Patient limited by pain   ? ?  ?  ? ?  ? ? ?Past Medical History:  ?Diagnosis Date  ? Anxiety   ? Arthritis   ? Asthma   ? COPD (chronic obstructive pulmonary disease) ([place])   ? Depression   ? Pneumonia   ? Recurrent upper respiratory infection (URI)   ? ? ?Past Surgical History:  ?Procedure Laterality Date  ? ankle sx  1973  ? APPENDECTOMY    ? BREAST REDUCTION SURGERY  06/24/2011  ? Procedure: MAMMARY REDUCTION  (BREAST);  Surgeon: Cristine Polio, MD;  Location: [facility];  Service: Plastics;  Laterality: Bilateral;  ? CERVICAL DISC SURGERY    ? (? laminectomy?)  ? [place]  ? left groin hernia  1974  ? REDUCTION MAMMAPLASTY    ? TONSILLECTOMY    ? ? ?There were no vitals filed for this visit. ? ? Subjective Assessment - 03/28/21 1104   ? ? Subjective Working on her exercises and may feel a little better. Pain may be on the Rt now more than the Lt. She has less pain but still has pain with increased sitting and especially if she coughs while sitting.   ? Currently in Pain? Yes   ? Pain Score 5    ? Pain Location Back   ? Pain Orientation Left;Right   ? Pain Descriptors / Indicators Sharp   ? Pain Type Acute pain;Chronic pain   ? ?  ?  ? ?  ? ? ? ? ? [facility] PT Assessment - 03/28/21 0001   ? ?  ? Assessment  ? Medical Diagnosis Lumbar DDD; cervical dysfunction   ? Referring Provider (PT) Dr  Dianah Field   ? Onset Date/Surgical Date 02/23/21   ? Hand Dominance Right   ? Next MD Visit 04/23/21   ? Prior Therapy after cervical disc surgery x 2 weeks   ?  ? Palpation  ? Spinal mobility hypomobile SI; L5; L4   ? Palpation comment tight and painful Lt psoas; posterior hip through the piriformis and gluts   ?  ? Special Tests  ? Other special tests (-) SLR   ? ?  ?  ? ?  ? ? ? ? ? ? ? ? ? ? ? ? ? ? ? ? [facility] Adult PT Treatment/Exercise - 03/28/21 0001   ? ?  ? Lumbar Exercises: Stretches  ? Passive Hamstring Stretch Left;2 reps;30 seconds   ? Passive Hamstring Stretch Limitations supine with strap and sitting   ? Press Ups --   6 reps  ? Press Ups Limitations 2-3 sec hold - to pt tolerance no pain   ? Piriformis Stretch Left;2 reps;30 seconds   ? Piriformis Stretch Limitations supine with strap travell   ?  ? Lumbar Exercises: Aerobic  ? Nustep L5 x  5 min UE/LE's   ?  ? Lumbar Exercises: Standing  ? Wall Slides 10 reps;5 seconds   ? Wall Slides Limitations VC to engage core and keep head upright   ? Row Strengthening;Both;10 reps;Theraband   ? Theraband Level (Row) Level 3 (Green)   ? Shoulder Extension Strengthening;Both;10 reps;Theraband   ? Theraband Level (Shoulder Extension) Level 3 (Green)   ?  ? Lumbar Exercises: Seated  ? Sit to Stand 5 reps   ? Sit to Stand Limitations VC for core engaged chest up - some difficulty but no pain   ?  ? Cryotherapy  ? Number Minutes Cryotherapy 15 Minutes   ? Cryotherapy Location Lumbar Spine;Hip   ? Type of Cryotherapy Ice pack   ?  ? Electrical Stimulation  ? Electrical Stimulation Location Lt LB/posterior hip   ? Electrical Stimulation Action TENs   ? Electrical Stimulation Parameters to tolerance   ? Electrical Stimulation Goals Pain;Tone   ?  ? Manual Therapy  ? Manual therapy comments deep tissue work through the posterior hip/buttock Lt > Rt   ? Joint Mobilization PA mobs lumbar spine Grade II   ? ?  ?  ? ?  ? ? ? ? ? ? ? ? ? ? PT Education - 03/28/21 1121   ? ?  Education Details HEP   ? Person(s) Educated Patient   ? Methods Explanation;Demonstration;Tactile cues;Verbal cues;Handout   ? Comprehension Verbalized understanding;Returned demonstration;Verbal cues required;Tactile cues required   ? ?  ?  ? ?  ? ? ? ? ? ? PT Long Term Goals - 03/15/21 1507   ? ?  ? PT LONG TERM GOAL #1  ? Title Decrease LBP ad Lt LE pain by 50-70% allowing patient to increase activity level   ? Time 6   ? Period Weeks   ? Status New   ? Target Date 04/26/21   ?  ? PT LONG TERM GOAL #2  ? Title Increase trunk mobility and ROM allowing patient to move from sit to stand and perform mini squat to participate in functional activities and ADL's with greater ease   ? Time 6   ? Period Weeks   ? Status New   ? Target Date 04/26/21   ?  ? PT LONG TERM GOAL #3  ? Title Patient to demonstrate and verbalize proper transitional and transfer techniques for back care   ? Time 6   ? Period Weeks   ? Status New   ? Target Date 04/26/21   ?  ? PT LONG TERM GOAL #4  ? Title Independent in [place]   ? Time 6   ? Period Weeks   ? Status New   ? Target Date 04/26/21   ?  ? PT LONG TERM GOAL #5  ? Title Improve functional limitation score to 61   ? Time 6   ? Period Weeks   ? Status New   ? Target Date 04/26/21   ? ?  ?  ? ?  ? ? ? ? ? ? ? ? Plan - 03/28/21 1100   ? ? Clinical Impression Statement Positive response to initial exercises with patient reporting less pain in the Lt LE. She continues to have pain in the LB and Lt LE on an intermittent basis. Added core stabilization exercises without difficulty and no increase in pain. Palpable tightness noted Lt > Rt posterior hip piriformis and glut min/med. Trial of DN at next visit   ?  Rehab Potential Good   ? PT Frequency 2x / week   ? PT Duration 6 weeks   ? PT Treatment/Interventions ADLs/Self Care Home Management;Aquatic Therapy;Cryotherapy;Electrical Stimulation;Iontophoresis '4mg'$ /ml Dexamethasone;Moist Heat;Traction;Ultrasound;Gait training;Stair training;Functional  mobility training;Therapeutic activities;Therapeutic exercise;Balance training;Neuromuscular re-education;Patient/family education;Manual techniques;Dry needling;Taping   ? PT Next Visit Plan review and progress HEP - add core stabilization; myofacial ball release work; postural correction; chin tuck/scap squeeze; posterior shoudler girdle strengthening; manual work/DN and modalities as indicated - trial of DN at next visit Lt posterior hip   ? PT Home Exercise Plan [email or web address]   ? Consulted and Agree with Plan of Care Patient   ? ?  ?  ? ?  ? ? ?Patient will benefit from skilled therapeutic intervention in order to improve the following deficits and impairments:    ? ?Visit Diagnosis: ?Acute left-sided low back pain with left-sided sciatica ? ?Cervical dysfunction ? ?Other symptoms and signs involving the musculoskeletal system ? ?Muscle weakness (generalized) ? ? ? ? ?Problem List ?Patient Active Problem List  ? Diagnosis Date Noted  ? Bilateral hand numbness 03/09/2021  ? Restrictive lung disease 11/02/2020  ? Benign paroxysmal positional vertigo, left 08/18/2020  ? Hyperlipidemia with target LDL less than 100 07/10/2020  ? Abnormal weight loss 07/10/2020  ? Pulmonary emphysema ([place]) 06/09/2020  ? Cervical fusion syndrome 09/29/2019  ? Cyst of finger of right hand 07/12/2019  ? Stress 06/28/2019  ? Essential hypertension 02/25/2019  ? Chronic diarrhea 01/21/2019  ? 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 UNSPECIFIED VENOUS INSUFFICIENCY 09/13/2009  ? Lumbar degenerative disc disease 09/13/2009  ? CHRONIC OBSTRUCTIVE PULMONARY DISEASE, ACUTE EXACERBATION 06/15/2009  ? FATIGUE 10/11/2008  ? ALLERGIC RHINITIS 03/21/2008  ? MDD (major depressive disorder) 10/22/2005  ? Panic attack 10/22/2005  ? TOBACCO DEPENDENCE 10/22/2005  ? OSTEOARTHRITIS, UNSPECIFIED 10/22/2005  ? ? ?Everardo All, PT, MPH  ?03/28/2021, 11:46 AM ? ?[facility] ?[facility] ?[place] ?[place], [place], [address] ?Phone: [phone]   Fax:  [phone] ? ?Name: Sherri [name] ?MRN: [id number] ?Date of Birth: 1955/01/20 ? ? ? ?

## 2021-03-30 ENCOUNTER — Encounter: Payer: Self-pay | Admitting: Rehabilitative and Restorative Service Providers"

## 2021-03-30 ENCOUNTER — Other Ambulatory Visit: Payer: Self-pay

## 2021-03-30 ENCOUNTER — Ambulatory Visit: Payer: Medicare Other | Admitting: Rehabilitative and Restorative Service Providers"

## 2021-03-30 DIAGNOSIS — M5136 Other intervertebral disc degeneration, lumbar region: Secondary | ICD-10-CM | POA: Diagnosis not present

## 2021-03-30 DIAGNOSIS — M539 Dorsopathy, unspecified: Secondary | ICD-10-CM | POA: Diagnosis not present

## 2021-03-30 DIAGNOSIS — R29898 Other symptoms and signs involving the musculoskeletal system: Secondary | ICD-10-CM | POA: Diagnosis not present

## 2021-03-30 DIAGNOSIS — M6281 Muscle weakness (generalized): Secondary | ICD-10-CM | POA: Diagnosis not present

## 2021-03-30 DIAGNOSIS — M5442 Lumbago with sciatica, left side: Secondary | ICD-10-CM

## 2021-03-30 NOTE — Therapy (Signed)
[facility] ?[facility] ?[place] ?[place], [place], [address] ?Phone: [phone]   Fax:  [phone] ? ?Physical Therapy Treatment ? ?Patient Details  ?Name: Sherri [name] ?MRN: [id number] ?Date of Birth: Sep 06, 1955 ?Referring Provider (PT): Dr Dianah Field ? ? ?Encounter Date: 03/30/2021 ? ? PT End of Session - 03/30/21 1150   ? ? Visit Number 3   ? Number of Visits 12   ? Date for PT Re-Evaluation 04/26/21   ? PT Start Time 1150   ? PT Stop Time 1230   ? PT Time Calculation (min) 40 min   ? Activity Tolerance Patient tolerated treatment well;Patient limited by pain   ? ?  ?  ? ?  ? ? ?Past Medical History:  ?Diagnosis Date  ? Anxiety   ? Arthritis   ? Asthma   ? COPD (chronic obstructive pulmonary disease) ([place])   ? Depression   ? Pneumonia   ? Recurrent upper respiratory infection (URI)   ? ? ?Past Surgical History:  ?Procedure Laterality Date  ? ankle sx  1973  ? APPENDECTOMY    ? BREAST REDUCTION SURGERY  06/24/2011  ? Procedure: MAMMARY REDUCTION  (BREAST);  Surgeon: Cristine Polio, MD;  Location: [facility];  Service: Plastics;  Laterality: Bilateral;  ? CERVICAL DISC SURGERY    ? (? laminectomy?)  ? [place]  ? left groin hernia  1974  ? REDUCTION MAMMAPLASTY    ? TONSILLECTOMY    ? ? ?There were no vitals filed for this visit. ? ? Subjective Assessment - 03/30/21 1154   ? ? Subjective The patient reports slower moving today b/c of rainy weather. She purchased a TENS unit for home from [employer] -- used insurance.   ? Patient Stated Goals help decrease pain so she can be more mobile   ? Currently in Pain? Yes   ? Pain Score 5    ? Pain Location Back   ? Pain Orientation Left;Right   ? Pain Descriptors / Indicators Aching   ? Pain Type Chronic pain;Acute pain   ? Pain Onset 1 to 4 weeks ago   ? Pain Frequency Intermittent   ? Aggravating Factors  sitting, bending, lifting   ? Pain Relieving Factors standing, meds   ? ?  ?  ? ?   ? ? ? ? ? [facility] PT Assessment - 03/30/21 1156   ? ?  ? Assessment  ? Medical Diagnosis Lumbar DDD; cervical dysfunction   ? Referring Provider (PT) Dr Dianah Field   ? Onset Date/Surgical Date 02/23/21   ? Hand Dominance Right   ? ?  ?  ? ?  ? ? ? ? ? ? ? ? ? ? ? ? ? ? ? ? [facility] Adult PT Treatment/Exercise - 03/30/21 1156   ? ?  ? Self-Care  ? Self-Care Other Self-Care Comments   ? Other Self-Care Comments  myofascial release with ball against the wall   ?  ? Exercises  ? Exercises Lumbar;Neck   ?  ? Lumbar Exercises: Stretches  ? Passive Hamstring Stretch Left;Right;2 reps;30 seconds   ? Press Ups 5 reps   ? Press Ups Limitations 3 second holds-- no pain   ? Piriformis Stretch Left;Right;2 reps;30 seconds   ? Piriformis Stretch Limitations supine with knee to opposite shoulder   ? Other Lumbar Stretch Exercise bilat knee to chest   ?  ? Lumbar Exercises: Aerobic  ? Nustep L5 x 5 minutes  UEs/LEs   ?  ? Manual Therapy  ? Manual Therapy Soft tissue mobilization;Myofascial release;Joint mobilization   ? Manual therapy comments skilled palpation to assess response to STM and DN   ? Joint Mobilization PA mobs lumbar spine Grade II-III   ? Soft tissue mobilization STM paraspinals lumbar region, L piformis, L glut med   ? Myofascial Release piriformis release   ? ?  ?  ? ?  ? ? ? Trigger Point Dry Needling - 03/30/21 1223   ? ? Consent Given? Yes   ? Education Handout Provided Yes   ? Muscles Treated Back/Hip Gluteus medius;Piriformis;Lumbar multifidi   ? Dry Needling Comments L side only   ? Gluteus Medius Response Twitch response elicited;Palpable increased muscle length   ? Piriformis Response Palpable increased muscle length   ? Lumbar multifidi Response Palpable increased muscle length   ? ?  ?  ? ?  ? ? ? ? ? ? ? ? ? ? ? ? ? PT Long Term Goals - 03/15/21 1507   ? ?  ? PT LONG TERM GOAL #1  ? Title Decrease LBP ad Lt LE pain by 50-70% allowing patient to increase activity level   ? Time 6   ? Period Weeks   ? Status  New   ? Target Date 04/26/21   ?  ? PT LONG TERM GOAL #2  ? Title Increase trunk mobility and ROM allowing patient to move from sit to stand and perform mini squat to participate in functional activities and ADL's with greater ease   ? Time 6   ? Period Weeks   ? Status New   ? Target Date 04/26/21   ?  ? PT LONG TERM GOAL #3  ? Title Patient to demonstrate and verbalize proper transitional and transfer techniques for back care   ? Time 6   ? Period Weeks   ? Status New   ? Target Date 04/26/21   ?  ? PT LONG TERM GOAL #4  ? Title Independent in [place]   ? Time 6   ? Period Weeks   ? Status New   ? Target Date 04/26/21   ?  ? PT LONG TERM GOAL #5  ? Title Improve functional limitation score to 61   ? Time 6   ? Period Weeks   ? Status New   ? Target Date 04/26/21   ? ?  ?  ? ?  ? ? ? ? ? ? ? ? Plan - 03/30/21 1237   ? ? Clinical Impression Statement The patient tolerated lumbar multifidi, glut med, and piriformis DN well today.  She also has myofascial, ball release for home to work on continuing to reduce myofascial tightness.  PT to continue to progress strengthening and mobility to patient tolerance.  Patient felt reduced pain at end of session today.   ? PT Frequency 2x / week   ? PT Duration 6 weeks   ? PT Treatment/Interventions ADLs/Self Care Home Management;Aquatic Therapy;Cryotherapy;Electrical Stimulation;Iontophoresis '4mg'$ /ml Dexamethasone;Moist Heat;Traction;Ultrasound;Gait training;Stair training;Functional mobility training;Therapeutic activities;Therapeutic exercise;Balance training;Neuromuscular re-education;Patient/family education;Manual techniques;Dry needling;Taping   ? PT Next Visit Plan review and progress HEP - add core stabilization; myofacial ball release work; postural correction; chin tuck/scap squeeze; posterior shoudler girdle strengthening; manual work/DN and modalities as indicated - how did she tolerate DN?   ? PT Home Exercise Plan [email or web address]   ? Consulted and Agree with Plan of Care  Patient   ? ?  ?  ? ?  ? ? ?  Patient will benefit from skilled therapeutic intervention in order to improve the following deficits and impairments:  Abnormal gait, Decreased range of motion, Decreased activity tolerance, Pain, Hypomobility, Impaired flexibility, Improper body mechanics, Decreased balance, Decreased mobility, Decreased strength, Postural dysfunction ? ?Visit Diagnosis: ?Acute left-sided low back pain with left-sided sciatica ? ?Cervical dysfunction ? ?Other symptoms and signs involving the musculoskeletal system ? ?Muscle weakness (generalized) ? ? ? ? ?Problem List ?Patient Active Problem List  ? Diagnosis Date Noted  ? Bilateral hand numbness 03/09/2021  ? Restrictive lung disease 11/02/2020  ? Benign paroxysmal positional vertigo, left 08/18/2020  ? Hyperlipidemia with target LDL less than 100 07/10/2020  ? Abnormal weight loss 07/10/2020  ? Pulmonary emphysema ([place]) 06/09/2020  ? Cervical fusion syndrome 09/29/2019  ? Cyst of finger of right hand 07/12/2019  ? Stress 06/28/2019  ? Essential hypertension 02/25/2019  ? Chronic diarrhea 01/21/2019  ? 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 UNSPECIFIED VENOUS INSUFFICIENCY 09/13/2009  ? Lumbar degenerative disc disease 09/13/2009  ? CHRONIC OBSTRUCTIVE PULMONARY DISEASE, ACUTE EXACERBATION 06/15/2009  ? FATIGUE 10/11/2008  ? ALLERGIC RHINITIS 03/21/2008  ? MDD (major depressive disorder) 10/22/2005  ? Panic attack 10/22/2005  ? TOBACCO DEPENDENCE 10/22/2005  ? OSTEOARTHRITIS, UNSPECIFIED 10/22/2005  ? ? ?Klee Kolek, [place] ?03/30/2021, 12:39 PM ? ?[facility] ?[place], [place], [address] ?Phone: [phone]   Fax:  [phone] ? ?Name: Sherri [name] ?MRN: [id number] ?Date of Birth: April 02, 1955 ? ? ? ?

## 2021-04-03 ENCOUNTER — Ambulatory Visit: Payer: Medicare Other | Admitting: Rehabilitative and Restorative Service Providers"

## 2021-04-05 ENCOUNTER — Ambulatory Visit: Payer: Medicare Other | Admitting: Rehabilitative and Restorative Service Providers"

## 2021-04-05 ENCOUNTER — Encounter: Payer: Self-pay | Admitting: Rehabilitative and Restorative Service Providers"

## 2021-04-05 ENCOUNTER — Other Ambulatory Visit: Payer: Self-pay

## 2021-04-05 DIAGNOSIS — R29898 Other symptoms and signs involving the musculoskeletal system: Secondary | ICD-10-CM

## 2021-04-05 DIAGNOSIS — M6281 Muscle weakness (generalized): Secondary | ICD-10-CM | POA: Diagnosis not present

## 2021-04-05 DIAGNOSIS — M5136 Other intervertebral disc degeneration, lumbar region: Secondary | ICD-10-CM | POA: Diagnosis not present

## 2021-04-05 DIAGNOSIS — M539 Dorsopathy, unspecified: Secondary | ICD-10-CM | POA: Diagnosis not present

## 2021-04-05 DIAGNOSIS — M5442 Lumbago with sciatica, left side: Secondary | ICD-10-CM

## 2021-04-05 NOTE — Therapy (Addendum)
[place], [place], [address] Phone: [phone]   Fax:  [phone]  Physical Therapy Treatment and Discharge Summary PHYSICAL THERAPY DISCHARGE SUMMARY  Visits from Start of Care: 4  Current functional level related to goals / functional outcomes: See progress note for discharge status    Remaining deficits: Unknown    Education / Equipment: HEP   Patient agrees to discharge. Patient goals were not met. Patient is being discharged due to not returning since the last visit.  Sherri [name] P. [name] PT, MPH 06/12/21 8:53 AM  Patient Details  Name: Sherri [name] MRN: [id number] Date of Birth: 1955/03/19 Referring Provider (PT): Dr Dianah Field   Encounter Date: 04/05/2021   PT End of Session - 04/05/21 1141     Visit Number 4    Number of Visits 12    Date for PT Re-Evaluation 04/26/21    PT Start Time 1141    PT Stop Time 1230    PT Time Calculation (min) 49 min    Activity Tolerance Patient tolerated treatment well;Patient limited by pain             Past Medical History:  Diagnosis Date   Anxiety    Arthritis    Asthma    COPD (chronic obstructive pulmonary disease) ([place])    Depression    Pneumonia    Recurrent upper respiratory infection (URI)     Past Surgical History:  Procedure Laterality Date   ankle sx  1973   APPENDECTOMY     BREAST REDUCTION SURGERY  06/24/2011   Procedure: MAMMARY REDUCTION  (BREAST);  Surgeon: Cristine Polio, MD;  Location: [facility];  Service: Plastics;  Laterality: Bilateral;   CERVICAL DISC SURGERY     (? laminectomy?)   CESAREAN SECTION  1976   left groin hernia  1974   REDUCTION MAMMAPLASTY     TONSILLECTOMY      There were no vitals filed for this visit.   Subjective Assessment - 04/05/21 1142     Subjective Patient reports that she is not having LB and Lt hip pain. The DN worked wonders and TENS unit is working  well. She is doing exercises at least once a day. Wants to work on neck and shouders today. Patient reported nausea with DN today - resolved in 2-3 min with good improvement in muscular tightness reported.    Currently in Pain? No/denies    Pain Score 0-No pain    Pain Location Back                [facility] PT Assessment - 04/05/21 0001       Assessment   Medical Diagnosis Lumbar DDD; cervical dysfunction    Referring Provider (PT) Dr Dianah Field    Onset Date/Surgical Date 02/23/21    Hand Dominance Right    Next MD Visit 04/23/21    Prior Therapy after cervical disc surgery x 2 weeks      AROM   Cervical Flexion 50 sore    Cervical Extension 50 sore    Cervical - Right Side Bend 26 tight    Cervical - Left Side Bend 35 tight    Cervical - Right Rotation 54 tight    Cervical - Left Rotation 62 tight      Palpation   Palpation comment tight bilat ant/lat/posterior cervical musculature; pecs;upper traps  [facility] Adult PT Treatment/Exercise - 04/05/21 0001       Neck Exercises: Seated   Neck Retraction Limitations axial extension 5 sec x 5 reps repeated with finger on chin to assist with technique for stretch      Neck Exercises: Stretches   Other Neck Stretches doorway stretch 3 positions 30 sec hold x 2 reps each position      Lumbar Exercises: Aerobic   Nustep L5 x 6 minutes UEs/LEs      Manual Therapy   Manual therapy comments skilled palpation to assess response to STM and DN    Joint Mobilization cervical and upper thoracic PA mobs Grade II-III    Soft tissue mobilization STM posterior lateral cervical musculature; posterior thoracic paraspinals; upper traps    Myofascial Release bilat upper traps              Trigger Point Dry Needling - 04/05/21 0001     Consent Given? Yes    Education Handout Provided Previously provided    Dry Needling Comments patient experienced nausea with DN - resolved in 2-3 min with cold  cloth to posterior neck and shoulders    Upper Trapezius Response Palpable increased muscle length;Twitch reponse elicited   Rt only   Suboccipitals Response Palpable increased muscle length    Cervical multifidi Response Palpable increased muscle length;Twitch reponse elicited                   PT Education - 04/05/21 1237     Education Details HEP    Person(s) Educated Patient    Methods Explanation;Demonstration;Tactile cues;Verbal cues;Handout    Comprehension Verbalized understanding;Returned demonstration;Verbal cues required;Tactile cues required                 PT Long Term Goals - 03/15/21 1507       PT LONG TERM GOAL #1   Title Decrease LBP ad Lt LE pain by 50-70% allowing patient to increase activity level    Time 6    Period Weeks    Status New    Target Date 04/26/21      PT LONG TERM GOAL #2   Title Increase trunk mobility and ROM allowing patient to move from sit to stand and perform mini squat to participate in functional activities and ADL's with greater ease    Time 6    Period Weeks    Status New    Target Date 04/26/21      PT LONG TERM GOAL #3   Title Patient to demonstrate and verbalize proper transitional and transfer techniques for back care    Time 6    Period Weeks    Status New    Target Date 04/26/21      PT LONG TERM GOAL #4   Title Independent in HEP    Time 6    Period Weeks    Status New    Target Date 04/26/21      PT LONG TERM GOAL #5   Title Improve functional limitation score to 61    Time 6    Period Weeks    Status New    Target Date 04/26/21                   Plan - 04/05/21 1216     Clinical Impression Statement Positive response to DN at last visit with report of resolutoinof back and Lt hip pain. She has persistent pain in the neck and shoulder area.  Trial of DN for posterior cervical and Rt upper trap resulted in patient report of nausea. Symptoms resolved in 2-3 minutes with noted decrease  in palpable tightness through the posterior cervical and upper trap musculature. Added pec stretch and axial extension without difficulty. Patient will benefit from continued treatment to progress with core stabilization and strengthening and additional DN/manual work; upper body stretching and posterior shoulder girdle strengthening to address cervical dysfunction.    Rehab Potential Good    PT Frequency 2x / week    PT Duration 6 weeks    PT Treatment/Interventions ADLs/Self Care Home Management;Aquatic Therapy;Cryotherapy;Electrical Stimulation;Iontophoresis 4mg /ml Dexamethasone;Moist Heat;Traction;Ultrasound;Gait training;Stair training;Functional mobility training;Therapeutic activities;Therapeutic exercise;Balance training;Neuromuscular re-education;Patient/family education;Manual techniques;Dry needling;Taping    PT Next Visit Plan review and progress HEP - progress with core stabilization; myofacial ball release work; postural correction; chin tuck/scap squeeze; posterior shoudler girdle strengthening; manual work/DN and modalities as indicated - continue DN lumbar/hip and cervical musculature as indicated    PT Home Exercise Plan [email or web address]    Consulted and Agree with Plan of Care Patient             Patient will benefit from skilled therapeutic intervention in order to improve the following deficits and impairments:     Visit Diagnosis: Acute left-sided low back pain with left-sided sciatica  Cervical dysfunction  Other symptoms and signs involving the musculoskeletal system  Muscle weakness (generalized)     Problem List Patient Active Problem List   Diagnosis Date Noted   Bilateral hand numbness 03/09/2021   Restrictive lung disease 11/02/2020   Benign paroxysmal positional vertigo, left 08/18/2020   Hyperlipidemia with target LDL less than 100 07/10/2020   Abnormal weight loss 07/10/2020   Pulmonary emphysema ([place]) 06/09/2020   Cervical fusion syndrome 09/29/2019    Cyst of finger of right hand 07/12/2019   Stress 06/28/2019   Essential hypertension 02/25/2019   Chronic diarrhea 01/21/2019   Chondromalacia of patella, left 03/09/2013   UNSPECIFIED VENOUS INSUFFICIENCY 09/13/2009   Lumbar degenerative disc disease 09/13/2009   CHRONIC OBSTRUCTIVE PULMONARY DISEASE, ACUTE EXACERBATION 06/15/2009   FATIGUE 10/11/2008   ALLERGIC RHINITIS 03/21/2008   MDD (major depressive disorder) 10/22/2005   Panic attack 10/22/2005   TOBACCO DEPENDENCE 10/22/2005   OSTEOARTHRITIS, UNSPECIFIED 10/22/2005    Dillinger Aston Nilda Simmer, PT, MPH  04/05/2021, 12:38 PM  [facility] [place] [address] [place], [place], [address] Phone: [phone]   Fax:  [phone]  Name: Sherri [name] MRN: [id number] Date of Birth: [birth year]

## 2021-04-05 NOTE — Patient Instructions (Signed)
?  Access Code: [email or web address] ?URL: [email or web address] ?Date: 04/05/2021 ?Prepared by: Gillermo Murdoch ? ?Exercises ?- Prone Press Up  - 2 x daily - 7 x weekly - 1 sets - 10 reps - 2-3 sec  hold ?- Supine Piriformis Stretch with Leg Straight  - 2 x daily - 7 x weekly - 1 sets - 3 reps - 30 sec  hold ?- Hooklying Hamstring Stretch with Strap  - 2 x daily - 7 x weekly - 1 sets - 3 reps - 30 sec  hold ?- Seated Hamstring Stretch  - 2 x daily - 7 x weekly - 1 sets - 3 reps - 30 sec  hold ?- Sit to Stand  - 2 x daily - 7 x weekly - 1 sets - 10 reps - 3-5 sec  hold ?- Standing Bilateral Low Shoulder Row with Anchored Resistance  - 2 x daily - 7 x weekly - 1-3 sets - 10 reps - 2-3 sec  hold ?- Shoulder Extension with Resistance  - 2 x daily - 7 x weekly - 1-3 sets - 10 reps - 2-3 sec  hold ?- Wall Quarter Squat  - 2 x daily - 7 x weekly - 1-2 sets - 10 reps - 5-10 sec  hold ?- Standing Glute Med Mobilization with Small Ball on Wall  - 2 x daily - 7 x weekly - 1 sets - 10 reps ?- Doorway Pec Stretch at 60 Degrees Abduction  - 3 x daily - 7 x weekly - 1 sets - 3 reps ?- Doorway Pec Stretch at 90 Degrees Abduction  - 3 x daily - 7 x weekly - 1 sets - 3 reps - 30 seconds  hold ?- Doorway Pec Stretch at 120 Degrees Abduction  - 3 x daily - 7 x weekly - 1 sets - 3 reps - 30 second hold  hold ?- Seated Cervical Retraction  - 2 x daily - 7 x weekly - 1-2 sets - 5-10 reps - 10 sec  hold ?- Cervical Retraction with Overpressure  - 2 x daily - 7 x weekly - 1 sets - 5 reps - 35 sec  hold ? ?

## 2021-04-09 ENCOUNTER — Ambulatory Visit: Payer: Medicare Other | Admitting: Rehabilitative and Restorative Service Providers"

## 2021-04-12 ENCOUNTER — Telehealth: Payer: Self-pay | Admitting: Rehabilitative and Restorative Service Providers"

## 2021-04-12 ENCOUNTER — Ambulatory Visit: Payer: Medicare Other | Admitting: Rehabilitative and Restorative Service Providers"

## 2021-04-12 NOTE — Telephone Encounter (Signed)
Sherri [name] failed to show for two scheduled appointments this week. Called and left message asking that she call to let us know if she is ready for discharge or would like to schedule additional appointments.  ? ?[name] P. [name] PT, MPH ?04/12/21 11:17 AM ? ?

## 2021-04-23 ENCOUNTER — Ambulatory Visit (INDEPENDENT_AMBULATORY_CARE_PROVIDER_SITE_OTHER): Payer: Medicare Other | Admitting: Sports Medicine

## 2021-04-23 DIAGNOSIS — M542 Cervicalgia: Secondary | ICD-10-CM | POA: Diagnosis not present

## 2021-04-23 DIAGNOSIS — R200 Anesthesia of skin: Secondary | ICD-10-CM

## 2021-04-23 DIAGNOSIS — M5136 Other intervertebral disc degeneration, lumbar region: Secondary | ICD-10-CM | POA: Diagnosis not present

## 2021-04-23 DIAGNOSIS — Q761 Klippel-Feil syndrome: Secondary | ICD-10-CM

## 2021-04-23 DIAGNOSIS — M51369 Other intervertebral disc degeneration, lumbar region without mention of lumbar back pain or lower extremity pain: Secondary | ICD-10-CM

## 2021-04-23 MED ORDER — TRIAZOLAM 0.25 MG PO TABS: ORAL_TABLET | ORAL | 0 refills | Status: DC

## 2021-04-23 NOTE — Assessment & Plan Note (Signed)
History of a C4-C6 fusion by Dr. Trenton Gammon, adjacent level disease, historically gabapentin 900 twice daily has helped. ?Unfortunately continues to have discomfort in spite of steroids, greater than 6 weeks of formal physical therapy, proceeding with MRI for epidural planning. ?Triazolam for preprocedural anxiolysis ?

## 2021-04-23 NOTE — Assessment & Plan Note (Signed)
This is a very pleasant 66 year old female, long history of axial low back pain, she does have radiation down the back of the left leg to the side of the knee but not past, back pain is worse with sitting, flexion, Valsalva and consistent with a discogenic pain generator. ?She is significantly better with prednisone, PT. ?Return as needed for this. ?

## 2021-04-23 NOTE — Assessment & Plan Note (Addendum)
There was bilateral hand numbness, tingling, nocturnal, positive Tinel's and Phalen signs on exam, she has improved slightly with nighttime splinting, she would like me to address her cervical spine before considering further treatment of likely carpal tunnel syndrome with median nerve hydrodissections. ?

## 2021-04-23 NOTE — Progress Notes (Signed)
? ? ?  Procedures performed today:   ? ?None. ? ?Independent interpretation of notes and tests performed by another provider:  ? ?None. ? ?Brief History, Exam, Impression, and Recommendations:   ? ?Lumbar degenerative disc disease ?This is a very pleasant 66 year old female, long history of axial low back pain, she does have radiation down the back of the left leg to the side of the knee but not past, back pain is worse with sitting, flexion, Valsalva and consistent with a discogenic pain generator. ?She is significantly better with prednisone, PT. ?Return as needed for this. ? ?Cervical fusion syndrome ?History of a C4-C6 fusion by Dr. Trenton Gammon, adjacent level disease, historically gabapentin 900 twice daily has helped. ?Unfortunately continues to have discomfort in spite of steroids, greater than 6 weeks of formal physical therapy, proceeding with MRI for epidural planning. ?Triazolam for preprocedural anxiolysis ? ?Bilateral hand numbness ?There was bilateral hand numbness, tingling, nocturnal, positive Tinel's and Phalen signs on exam, she has improved slightly with nighttime splinting, she would like me to address her cervical spine before considering further treatment of likely carpal tunnel syndrome with median nerve hydrodissections. ? ? ? ?___________________________________________ ?Gwen Her. Dianah Field, M.D., ABFM., CAQSM. ?Primary Care and Sports Medicine ?[place] ? ?Adjunct Instructor of Family Medicine  ?[employer] ?

## 2021-04-26 ENCOUNTER — Emergency Department (INDEPENDENT_AMBULATORY_CARE_PROVIDER_SITE_OTHER): Payer: Medicare Other

## 2021-04-26 ENCOUNTER — Emergency Department
Admission: EM | Admit: 2021-04-26 | Discharge: 2021-04-26 | Disposition: A | Discharge status: Home / Self Care | Payer: Medicare Other | Source: Home / Self Care

## 2021-04-26 DIAGNOSIS — M25442 Effusion, left hand: Secondary | ICD-10-CM | POA: Diagnosis not present

## 2021-04-26 DIAGNOSIS — M79642 Pain in left hand: Secondary | ICD-10-CM

## 2021-04-26 DIAGNOSIS — S60222A Contusion of left hand, initial encounter: Secondary | ICD-10-CM

## 2021-04-26 DIAGNOSIS — M7989 Other specified soft tissue disorders: Secondary | ICD-10-CM | POA: Diagnosis not present

## 2021-04-26 MED ORDER — CELECOXIB 100 MG PO CAPS: 100.0000 mg | ORAL_CAPSULE | Freq: Two times a day (BID) | ORAL | 0 refills | Status: AC

## 2021-04-26 NOTE — ED Provider Notes (Signed)
?[facility] ? ? ? ?CSN: [id number] ?Arrival date & time: 04/26/21  1237 ? ? ?  ? ?History   ?Chief Complaint ?Chief Complaint  ?Patient presents with  ? Hand Injury  ? ? ?HPI ?Sherri [name] is a 66 y.o. female.  ? ?HPI 66 year old female presents with left hand injury that occurred yesterday morning while raising a window at her home.  Patient reports second and third fingers of left hand and were hyperextended accidentally while attempting to open this window at her home.  PMH significant for COPD, bilateral hand numbness, and restrictive lung disease. ? ?Past Medical History:  ?Diagnosis Date  ? Anxiety   ? Arthritis   ? Asthma   ? COPD (chronic obstructive pulmonary disease) ([place])   ? Depression   ? Pneumonia   ? Recurrent upper respiratory infection (URI)   ? ? ?Patient Active Problem List  ? Diagnosis Date Noted  ? Bilateral hand numbness 03/09/2021  ? Restrictive lung disease 11/02/2020  ? Benign paroxysmal positional vertigo, left 08/18/2020  ? Hyperlipidemia with target LDL less than 100 07/10/2020  ? Abnormal weight loss 07/10/2020  ? Pulmonary emphysema ([place]) 06/09/2020  ? Cervical fusion syndrome 09/29/2019  ? Cyst of finger of right hand 07/12/2019  ? Stress 06/28/2019  ? Essential hypertension 02/25/2019  ? Chronic diarrhea 01/21/2019  ? 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 UNSPECIFIED VENOUS INSUFFICIENCY 09/13/2009  ? Lumbar degenerative disc disease 09/13/2009  ? CHRONIC OBSTRUCTIVE PULMONARY DISEASE, ACUTE EXACERBATION 06/15/2009  ? FATIGUE 10/11/2008  ? ALLERGIC RHINITIS 03/21/2008  ? MDD (major depressive disorder) 10/22/2005  ? Panic attack 10/22/2005  ? TOBACCO DEPENDENCE 10/22/2005  ? OSTEOARTHRITIS, UNSPECIFIED 10/22/2005  ? ? ?Past Surgical History:  ?Procedure Laterality Date  ? ankle sx  1973  ? APPENDECTOMY    ? BREAST REDUCTION SURGERY  06/24/2011  ? Procedure: MAMMARY REDUCTION  (BREAST);  Surgeon: Cristine Polio, MD;  Location: [facility];   Service: Plastics;  Laterality: Bilateral;  ? CERVICAL DISC SURGERY    ? (? laminectomy?)  ? [place]  ? left groin hernia  1974  ? REDUCTION MAMMAPLASTY    ? TONSILLECTOMY    ? ? ?OB History   ?No obstetric history on file. ?  ? ? ? ?Home Medications   ? ?Prior to Admission medications   ?Medication Sig Start Date End Date Taking? Authorizing Provider  ?celecoxib (CELEBREX) 100 MG capsule Take 1 capsule (100 mg total) by mouth 2 (two) times daily for 15 days. 04/26/21 05/11/21 Yes Eliezer Lofts, FNP  ?albuterol (VENTOLIN HFA) 108 (90 Base) MCG/ACT inhaler TAKE 2 PUFFS BY MOUTH EVERY 6 HOURS AS NEEDED FOR WHEEZE OR SHORTNESS OF BREATH 06/30/20   Hali Marry, MD  ?atorvastatin (LIPITOR) 40 MG tablet Take 1 tablet (40 mg total) by mouth at bedtime. 06/14/20   Hali Marry, MD  ?FLUoxetine (PROZAC) 20 MG capsule Take 3 capsules (60 mg total) by mouth daily. 11/02/20   Hali Marry, MD  ?gabapentin (NEURONTIN) 300 MG capsule 2 CAPSULES IN THE MORNING AND 3 CAPSULES AT NIGHT 12/25/20   Silverio Decamp, MD  ?hydrochlorothiazide (HYDRODIURIL) 25 MG tablet TAKE 1 TABLET BY MOUTH EVERY DAY 12/01/20   Hali Marry, MD  ?montelukast (SINGULAIR) 10 MG tablet Take 1 tablet (10 mg total) by mouth at bedtime. 09/25/20   Terrilyn Saver, NP  ?Tiotropium Bromide-Olodaterol 2.5-2.5 MCG/ACT AERS Inhale 2 puffs into the lungs daily. 09/25/20  Terrilyn Saver, NP  ?triazolam (HALCION) 0.25 MG tablet 1-2 tabs PO 2 hours before procedure or imaging.  Do not drive with this medication. 04/23/21   Silverio Decamp, MD  ? ? ?Family History ?Family History  ?Problem Relation Age of Onset  ? Hypertension Mother   ? Depression Mother   ? Alzheimer's disease Mother   ? Diverticulitis Mother   ? Alcohol abuse Father   ? Diabetes Father   ? Cancer Other   ?     pancreatic  ? Pancreatic cancer Maternal Grandmother   ? Colon cancer Neg Hx   ? Colon polyps Neg Hx   ? Esophageal cancer Neg Hx   ?  Rectal cancer Neg Hx   ? Stomach cancer Neg Hx   ? ? ?Social History ?Social History  ? ?Tobacco Use  ? Smoking status: Every Day  ?  Packs/day: 0.50  ?  Years: 35.00  ?  Pack years: 17.50  ?  Types: Cigarettes  ? Smokeless tobacco: Current  ? Tobacco comments:  ?  5 cigarettes day. Vape as well  ?Vaping Use  ? Vaping Use: Never used  ?Substance Use Topics  ? Alcohol use: No  ?  Alcohol/week: 0.0 standard drinks  ? Drug use: No  ? ? ? ?Allergies   ?Hydrocodone and Penicillins ? ? ?Review of Systems ?Review of Systems  ?Musculoskeletal:   ?     Left hand pain x1 day secondary to left hand accident, second and third fingers hyperextended while attempting to open a window at her home.  ? ? ?Physical Exam ?Triage Vital Signs ?ED Triage Vitals  ?Enc Vitals Group  ?   BP 04/26/21 1247 136/87  ?   Pulse Rate 04/26/21 1247 75  ?   Resp 04/26/21 1247 14  ?   Temp 04/26/21 1247 98.2 ?F (36.8 ?C)  ?   Temp Source 04/26/21 1247 Oral  ?   SpO2 04/26/21 1247 96 %  ?   Weight --   ?   Height --   ?   Head Circumference --   ?   Peak Flow --   ?   Pain Score 04/26/21 1248 7  ?   Pain Loc --   ?   Pain Edu? --   ?   Excl. in [place]? --   ? ?No data found. ? ?Updated Vital Signs ?BP 136/87 (BP Location: Right Arm)   Pulse 75   Temp 98.2 ?F (36.8 ?C) (Oral)   Resp 14   SpO2 96%  ? ?Physical Exam ?Vitals and nursing note reviewed.  ?Constitutional:   ?   Appearance: Normal appearance. She is normal weight.  ?HENT:  ?   Head: Normocephalic and atraumatic.  ?   Mouth/Throat:  ?   Mouth: Mucous membranes are moist.  ?   Pharynx: Oropharynx is clear.  ?Eyes:  ?   Extraocular Movements: Extraocular movements intact.  ?   Conjunctiva/sclera: Conjunctivae normal.  ?   Pupils: Pupils are equal, round, and reactive to light.  ?Cardiovascular:  ?   Rate and Rhythm: Normal rate and regular rhythm.  ?   Pulses: Normal pulses.  ?   Heart sounds: Normal heart sounds. No murmur heard. ?Pulmonary:  ?   Effort: Pulmonary effort is normal.  ?   Breath  sounds: Normal breath sounds. No wheezing, rhonchi or rales.  ?Musculoskeletal:  ?   Cervical back: Normal range of motion and neck supple. No tenderness.  ?  Comments: Left hand (dorsum): TTP over distal 2nd, 3rd metacarpals with moderate soft tissue swelling noted; grip is 3/5 neurovascular intact, neurosensory intact, brisk cap refill  ?Lymphadenopathy:  ?   Cervical: No cervical adenopathy.  ?Skin: ?   General: Skin is warm and dry.  ?Neurological:  ?   General: No focal deficit present.  ?   Mental Status: She is alert and oriented to person, place, and time. Mental status is at baseline.  ? ? ? ?UC Treatments / Results  ?Labs ?(all labs ordered are listed, but only abnormal results are displayed) ?Labs Reviewed - No data to display ? ?EKG ? ? ?Radiology ?DG Hand Complete Left ? ?Result Date: 04/26/2021 ?CLINICAL DATA:  Pt presents with left hand injury that occurred yesterday morning while raising a window. Noted swelling @ 2nd, 3rd and 4th carpometacarpal joints EXAM: LEFT HAND - COMPLETE 3+ VIEW COMPARISON:  None. FINDINGS: Normal alignment no fracture. Mild degenerative change base of thumb. IMPRESSION: Negative for fracture. Electronically Signed   By: Franchot Gallo M.D.   On: 04/26/2021 13:05   ? ?Procedures ?Procedures (including critical care time) ? ?Medications Ordered in UC ?Medications - No data to display ? ?Initial Impression / Assessment and Plan / UC Course  ?I have reviewed the triage vital signs and the nursing notes. ? ?Pertinent labs & imaging results that were available during my care of the patient were reviewed by me and considered in my medical decision making (see chart for details). ? ?  ? ?MDM: 1.  Left hand pain-left hand x-ray revealed no osseous abnormality, Rx'd Celebrex. Advised patient of left hand x-ray results with hard copy provided to patient.  Advised patient to take medication as directed with food to completion.  Encouraged patient to increase daily water intake while  taking this medication.  2.  Contusion of left hand, initial encounter-Advised patient to RICE affected area of left hand for 25 minutes 2-3 times daily for the next 2 to 3 days. Advised patient if symptoms worse

## 2021-04-26 NOTE — ED Triage Notes (Signed)
Pt presents with left hand injury that occurred yesterday morning while raising a window. ?

## 2021-04-26 NOTE — Discharge Instructions (Addendum)
Advised patient of left hand x-ray results with hard copy provided to patient.  Advised patient to take medication as directed with food to completion.  Encouraged patient to increase daily water intake while taking this medication.  Advised patient to RICE affected area of left hand for 25 minutes 2-3 times daily for the next 2 to 3 days.  Advised patient if symptoms worsen and/or unresolved please follow-up with PCP or here for further evaluation. ?

## 2021-04-28 ENCOUNTER — Ambulatory Visit (INDEPENDENT_AMBULATORY_CARE_PROVIDER_SITE_OTHER): Payer: Medicare Other

## 2021-04-28 DIAGNOSIS — M4802 Spinal stenosis, cervical region: Secondary | ICD-10-CM | POA: Diagnosis not present

## 2021-04-28 DIAGNOSIS — M542 Cervicalgia: Secondary | ICD-10-CM | POA: Diagnosis not present

## 2021-04-28 DIAGNOSIS — M4312 Spondylolisthesis, cervical region: Secondary | ICD-10-CM | POA: Diagnosis not present

## 2021-04-28 DIAGNOSIS — M47812 Spondylosis without myelopathy or radiculopathy, cervical region: Secondary | ICD-10-CM | POA: Diagnosis not present

## 2021-04-28 DIAGNOSIS — Q761 Klippel-Feil syndrome: Secondary | ICD-10-CM | POA: Diagnosis not present

## 2021-04-28 DIAGNOSIS — M2578 Osteophyte, vertebrae: Secondary | ICD-10-CM | POA: Diagnosis not present

## 2021-05-07 ENCOUNTER — Encounter: Payer: Self-pay | Admitting: Family Medicine

## 2021-05-07 ENCOUNTER — Ambulatory Visit (INDEPENDENT_AMBULATORY_CARE_PROVIDER_SITE_OTHER): Payer: Medicare Other | Admitting: Family Medicine

## 2021-05-07 VITALS — BP 177/81 | HR 66 | Wt 146.0 lb

## 2021-05-07 DIAGNOSIS — J302 Other seasonal allergic rhinitis: Secondary | ICD-10-CM | POA: Diagnosis not present

## 2021-05-07 DIAGNOSIS — J439 Emphysema, unspecified: Secondary | ICD-10-CM | POA: Diagnosis not present

## 2021-05-07 DIAGNOSIS — K047 Periapical abscess without sinus: Secondary | ICD-10-CM

## 2021-05-07 MED ORDER — FLUOXETINE HCL 20 MG PO CAPS: 60.0000 mg | ORAL_CAPSULE | Freq: Every day | ORAL | 3 refills | Status: DC

## 2021-05-07 MED ORDER — CEFTRIAXONE SODIUM 1 G IJ SOLR
1.0000 g | Freq: Once | INTRAMUSCULAR | Status: AC
  Administered 2021-05-07: 1 g via INTRAMUSCULAR

## 2021-05-07 MED ORDER — OXYCODONE-ACETAMINOPHEN 5-325 MG PO TABS: 1.0000 | ORAL_TABLET | Freq: Four times a day (QID) | ORAL | 0 refills | Status: AC | PRN

## 2021-05-07 MED ORDER — MONTELUKAST SODIUM 10 MG PO TABS: 10.0000 mg | ORAL_TABLET | Freq: Every day | ORAL | 3 refills | Status: DC

## 2021-05-07 MED ORDER — AMOXICILLIN-POT CLAVULANATE 875-125 MG PO TABS: 1.0000 | ORAL_TABLET | Freq: Two times a day (BID) | ORAL | 0 refills | Status: DC

## 2021-05-07 NOTE — Progress Notes (Signed)
L side tooth abscess. She stated that this began  2 days ago. She has been taking Tylenol for the pain. L side of face extremely swollen.  ? ?Unable to get into the dentist until 6/13.  ?

## 2021-05-07 NOTE — Progress Notes (Signed)
? ?Acute Office Visit ? ?Subjective:  ? ?  ?Patient ID: Sherri [name], female    DOB: May 16, 1955, 66 y.o.   MRN: [id number] ? ?Chief Complaint  ?Patient presents with  ? tooth abscess  ? ? ?HPI ?Patient is in today for L side tooth abscess. She stated that this began  2 days ago. She has been taking Tylenol for the pain. L side of face extremely swollen.  She says when she woke up this morning it was much worse she has not run a fever.  She has been putting ice on it and that has helped some.  He notes she has 3 bad teeth on the posterior left jaw and in fact just got new insurance and was planning on scheduling an appointment so that she could have those extracted ?  ?Unable to get into the dentist until 6/13.  ? ? ? ?ROS ? ? ?   ?Objective:  ?  ?BP (!) 175/89   Pulse 62   Wt 146 lb (66.2 kg)   SpO2 94%   BMI 24.30 kg/m?  ? ? ?Physical Exam ?Vitals reviewed.  ?Constitutional:   ?   Appearance: She is well-developed.  ?HENT:  ?   Head: Normocephalic and atraumatic.  ?   Right Ear: Tympanic membrane, ear canal and external ear normal.  ?   Left Ear: External ear normal.  ?   Ears:  ?   Comments: Right canal blocked by cerumen. ?Eyes:  ?   Conjunctiva/sclera: Conjunctivae normal.  ?Cardiovascular:  ?   Rate and Rhythm: Normal rate.  ?Pulmonary:  ?   Effort: Pulmonary effort is normal.  ?Skin: ?   General: Skin is dry.  ?   Coloration: Skin is not pale.  ?   Comments: Left side of her face with significant swelling over the lower half of the jaw going down into her neck.  With some mild erythema.  Very tender to touch.  No palpable cervical lymphadenopathy.  Non-tender over the TMJ joint.  ?Neurological:  ?   Mental Status: She is alert and oriented to person, place, and time.  ?Psychiatric:     ?   Behavior: Behavior normal.  ? ? ?No results found for any visits on 05/07/21. ? ? ?   ?Assessment & Plan:  ? ?Problem List Items Addressed This Visit   ? ?  ? Respiratory  ? Pulmonary emphysema ([place])  ? Relevant  Medications  ? montelukast (SINGULAIR) 10 MG tablet  ? ?Other Visit Diagnoses   ? ? Dental abscess    -  Primary  ? Relevant Medications  ? oxyCODONE-acetaminophen (PERCOCET/ROXICET) 5-325 MG tablet  ? Seasonal allergies      ? Relevant Medications  ? montelukast (SINGULAIR) 10 MG tablet  ? ?  ? ?Dental abscess she has significant facial swelling and erythema over her left lower jaw going down into her neck.  No palpable lymphadenopathy.  Left TM is clear.  I am very concerned about the amount of swelling present.  She is extremely tender to touch.  Medical ahead and give her 1 g of Rocephin IM here and treat with Augmentin.  Also given 20 tabs of oxycodone to use sparingly over the next several days.  She is on a wait list for the dentist so just encouraged her to call every few days and bugged them to see if she can get a sooner appointment. ? ? ?Meds ordered this encounter  ?Medications  ? montelukast (SINGULAIR)  10 MG tablet  ?  Sig: Take 1 tablet (10 mg total) by mouth at bedtime.  ?  Dispense:  90 tablet  ?  Refill:  3  ? FLUoxetine (PROZAC) 20 MG capsule  ?  Sig: Take 3 capsules (60 mg total) by mouth daily.  ?  Dispense:  90 capsule  ?  Refill:  3  ? amoxicillin-clavulanate (AUGMENTIN) 875-125 MG tablet  ?  Sig: Take 1 tablet by mouth 2 (two) times daily.  ?  Dispense:  14 tablet  ?  Refill:  0  ? oxyCODONE-acetaminophen (PERCOCET/ROXICET) 5-325 MG tablet  ?  Sig: Take 1 tablet by mouth every 6 (six) hours as needed for up to 5 days for severe pain.  ?  Dispense:  20 tablet  ?  Refill:  0  ? ? ?No follow-ups on file. ? ?Beatrice Lecher, MD ? ? ?

## 2021-05-07 NOTE — Addendum Note (Signed)
Addended by: Teddy Spike on: 05/07/2021 04:10 PM ? ? Modules accepted: Orders ? ?

## 2021-05-16 ENCOUNTER — Encounter: Payer: Self-pay | Admitting: Family Medicine

## 2021-05-16 ENCOUNTER — Ambulatory Visit (INDEPENDENT_AMBULATORY_CARE_PROVIDER_SITE_OTHER): Payer: Medicare Other | Admitting: Family Medicine

## 2021-05-16 VITALS — BP 154/73 | HR 64 | Ht 65.0 in | Wt 143.0 lb

## 2021-05-16 DIAGNOSIS — H6121 Impacted cerumen, right ear: Secondary | ICD-10-CM | POA: Diagnosis not present

## 2021-05-16 DIAGNOSIS — K047 Periapical abscess without sinus: Secondary | ICD-10-CM | POA: Diagnosis not present

## 2021-05-16 DIAGNOSIS — R220 Localized swelling, mass and lump, head: Secondary | ICD-10-CM

## 2021-05-16 NOTE — Progress Notes (Signed)
? ?  Established Patient Office Visit ? ?Subjective   ?Patient ID: Sherri [name], female    DOB: 22-Apr-1955  Age: 66 y.o. MRN: [id number] ? ?Chief Complaint  ?Patient presents with  ? Follow-up  ? ? ?HPI ?She is here today to follow-up on her right ear cerumen impaction.  And also to follow-up on dental abscess and left facial swelling.  She had such significant swelling that was very concerning when she came in previously I did ask her to come back so we can make sure that she was improving.  She still just has a little bit of mild swelling a little bit of numbness and tingling at that left lower jaw area but it is significantly better.  She did finish her antibiotics.  She was able to get a dentist appointment for mid June.  But they said they would try to work her in sooner if they had a cancellation. ? ? ? ? ?ROS ? ?  ?Objective:  ?  ? ?BP (!) 154/73   Pulse 64   Ht '5\' 5"'$  (1.651 m)   Wt 143 lb (64.9 kg)   SpO2 94%   BMI 23.80 kg/m?  ? ? ?Physical Exam ? ? ?No results found for any visits on 05/16/21. ? ? ? ?The 10-year ASCVD risk score (Arnett DK, et al., 2019) is: 22.1% ? ?  ?Assessment & Plan:  ? ?Problem List Items Addressed This Visit   ?None ?Visit Diagnoses   ? ? Hearing loss due to cerumen impaction, right    -  Primary  ? Dental abscess      ? Facial swelling      ? ?  ? ?Dental abscess with facial swelling-she does have an appointment mid June.  So we discussed that if at any point she feels like the swelling pain or discomfort is coming back to please let me know immediately in case we need to put her on another round of antibiotics. ? ?Cerumen impaction of right ear-irrigation performed.  Patient tolerated well.  She still had a little bit of residual wax right at the eardrum so recommended that she do Debrox drops at home. ? ?Indication: Cerumen impaction of the ear(s) ?Medical necessity statement: On physical examination, cerumen impairs clinically significant portions of the external auditory  canal, and tympanic membrane. Noted obstructive, copious cerumen that cannot be removed without magnification and instrumentations ?Consent: Discussed benefits and risks of procedure and verbal consent obtained ?Procedure: Patient was prepped for the procedure. Utilized an otoscope to assess and take note of the ear canal, the tympanic membrane, and the presence, amount, and placement of the cerumen. Gentle water irrigation and soft plastic curette was utilized to remove cerumen.  ?Post procedure examination: shows cerumen was completely removed. Patient tolerated procedure well. The patient is made aware that they may experience temporary vertigo, temporary hearing loss, and temporary discomfort. If these symptom last for more than 24 hours to call the clinic or proceed to the ED.  ? ? ? ?No follow-ups on file.  ? ? ?Beatrice Lecher, MD ? ?

## 2021-05-16 NOTE — Patient Instructions (Signed)
Debrox drops.  Use twice a day for 4 days and then give your ear break for 3 days.  Then repeat the next week. ?

## 2021-05-31 ENCOUNTER — Other Ambulatory Visit: Payer: Self-pay | Admitting: Family Medicine

## 2021-06-01 ENCOUNTER — Ambulatory Visit (INDEPENDENT_AMBULATORY_CARE_PROVIDER_SITE_OTHER): Payer: Medicare Other | Admitting: Sports Medicine

## 2021-06-01 DIAGNOSIS — M19042 Primary osteoarthritis, left hand: Secondary | ICD-10-CM

## 2021-06-01 DIAGNOSIS — Q761 Klippel-Feil syndrome: Secondary | ICD-10-CM | POA: Diagnosis not present

## 2021-06-01 NOTE — Assessment & Plan Note (Signed)
Increasing pain after trying to open a window with her second and third fingers about 2 to 3 months ago. She has mild swelling, pain second and third MCPs, x-rays did show some osteoarthritis in the joints. Not bad enough to consider an injection today, we will see if she gets systemic effect from her epidural and if not we can do left second and third MCP injections.

## 2021-06-01 NOTE — Assessment & Plan Note (Signed)
Very pleasant 66 year old female, history of a C4-C6 ACDF by Dr. 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 Trenton Gammon for consideration of extension of the ACDF to T1 if not better.

## 2021-06-01 NOTE — Progress Notes (Signed)
    Procedures performed today:    None.  Independent interpretation of notes and tests performed by another provider:   None.  Brief History, Exam, Impression, and Recommendations:    Cervical fusion syndrome Very pleasant 66 year old female, history of a C4-C6 ACDF by Dr. 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 Trenton Gammon for consideration of extension of the ACDF to T1 if not better.  Primary osteoarthritis of left hand Increasing pain after trying to open a window with her second and third fingers about 2 to 3 months ago. She has mild swelling, pain second and third MCPs, x-rays did show some osteoarthritis in the joints. Not bad enough to consider an injection today, we will see if she gets systemic effect from her epidural and if not we can do left second and third MCP injections.    ___________________________________________ Gwen Her. Dianah Field, M.D., ABFM., CAQSM. Primary Care and [place] Instructor of [employer]

## 2021-06-21 ENCOUNTER — Ambulatory Visit
Admission: RE | Admit: 2021-06-21 | Discharge: 2021-06-21 | Disposition: A | Discharge status: Home / Self Care | Payer: Medicare Other | Source: Ambulatory Visit | Attending: Sports Medicine | Admitting: Sports Medicine

## 2021-06-21 DIAGNOSIS — Q761 Klippel-Feil syndrome: Secondary | ICD-10-CM

## 2021-06-21 DIAGNOSIS — M4802 Spinal stenosis, cervical region: Secondary | ICD-10-CM | POA: Diagnosis not present

## 2021-06-21 DIAGNOSIS — M4322 Fusion of spine, cervical region: Secondary | ICD-10-CM | POA: Diagnosis not present

## 2021-06-21 MED ORDER — IOPAMIDOL (ISOVUE-M 300) INJECTION 61%
10.0000 mL | Freq: Once | INTRAMUSCULAR | Status: AC
  Administered 2021-06-21: 10 mL via EPIDURAL

## 2021-06-21 MED ORDER — TRIAMCINOLONE ACETONIDE 40 MG/ML IJ SUSP (RADIOLOGY)
60.0000 mg | Freq: Once | INTRAMUSCULAR | Status: AC
  Administered 2021-06-21: 60 mg via EPIDURAL

## 2021-06-21 NOTE — Discharge Instructions (Signed)

## 2021-07-19 ENCOUNTER — Ambulatory Visit: Payer: Medicare Other | Admitting: Sports Medicine

## 2021-07-30 ENCOUNTER — Ambulatory Visit (INDEPENDENT_AMBULATORY_CARE_PROVIDER_SITE_OTHER): Payer: Medicare Other | Admitting: Sports Medicine

## 2021-07-30 DIAGNOSIS — Q761 Klippel-Feil syndrome: Secondary | ICD-10-CM | POA: Diagnosis not present

## 2021-07-30 NOTE — Assessment & Plan Note (Signed)
Pleasant 66 year old female, history of C4-C6 ACDF by Dr. 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 Left C6-C7 interlaminar epidural provided fairly good relief but she is having a recurrence of discomfort so we will proceed with epidural #2, she prefers Dr. Jeralyn Ruths. Return to see us/let me know if she would like epidural #3.

## 2021-07-30 NOTE — Progress Notes (Signed)
    Procedures performed today:    None.  Independent interpretation of notes and tests performed by another provider:   None.  Brief History, Exam, Impression, and Recommendations:    Cervical fusion syndrome Pleasant 66 year old female, history of C4-C6 ACDF by Dr. 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 Left C6-C7 interlaminar epidural provided fairly good relief but she is having a recurrence of discomfort so we will proceed with epidural #2, she prefers Dr. Jeralyn Ruths. Return to see us/let me know if she would like epidural #3.    ____________________________________________ Gwen Her. Dianah Field, M.D., ABFM., CAQSM., AME. Primary Care and Sports Medicine [facility]  Adjunct Professor of [place] [employer]  Risk manager

## 2021-08-09 ENCOUNTER — Ambulatory Visit
Admission: RE | Admit: 2021-08-09 | Discharge: 2021-08-09 | Disposition: A | Discharge status: Home / Self Care | Payer: Medicare Other | Source: Ambulatory Visit | Attending: Sports Medicine | Admitting: Sports Medicine

## 2021-08-09 DIAGNOSIS — M47812 Spondylosis without myelopathy or radiculopathy, cervical region: Secondary | ICD-10-CM | POA: Diagnosis not present

## 2021-08-09 DIAGNOSIS — Q761 Klippel-Feil syndrome: Secondary | ICD-10-CM

## 2021-08-09 MED ORDER — IOPAMIDOL (ISOVUE-M 300) INJECTION 61%
1.0000 mL | Freq: Once | INTRAMUSCULAR | Status: AC
  Administered 2021-08-09: 1 mL via EPIDURAL

## 2021-08-09 NOTE — Discharge Instructions (Signed)

## 2021-08-24 ENCOUNTER — Other Ambulatory Visit: Payer: Self-pay | Admitting: Family Medicine

## 2021-09-03 ENCOUNTER — Ambulatory Visit (INDEPENDENT_AMBULATORY_CARE_PROVIDER_SITE_OTHER): Payer: Medicare Other

## 2021-09-03 ENCOUNTER — Ambulatory Visit (INDEPENDENT_AMBULATORY_CARE_PROVIDER_SITE_OTHER): Payer: Medicare Other | Admitting: Sports Medicine

## 2021-09-03 DIAGNOSIS — G894 Chronic pain syndrome: Secondary | ICD-10-CM | POA: Diagnosis not present

## 2021-09-03 DIAGNOSIS — M19071 Primary osteoarthritis, right ankle and foot: Secondary | ICD-10-CM | POA: Diagnosis not present

## 2021-09-03 DIAGNOSIS — M19271 Secondary osteoarthritis, right ankle and foot: Secondary | ICD-10-CM

## 2021-09-03 DIAGNOSIS — Q761 Klippel-Feil syndrome: Secondary | ICD-10-CM

## 2021-09-03 MED ORDER — DICLOFENAC SODIUM 1 % EX GEL: 4.0000 g | Freq: Four times a day (QID) | CUTANEOUS | 11 refills | Status: DC

## 2021-09-03 NOTE — Progress Notes (Signed)
    Procedures performed today:    None.  Independent interpretation of notes and tests performed by another provider:   None.  Brief History, Exam, Impression, and Recommendations:    Secondary osteoarthritis of right ankle Pleasant 66 year old female, 50 years ago she had what sounds to be an ORIF of ankle fracture, now with pain, gelling, mostly medial and lateral tibiotalar. Adding x-rays, topical Voltaren, home conditioning. Return to see me in about a month, we will do an ankle joint injection if not better.  Cervical fusion syndrome Kendrick Fries also has chronic neck pain, she is post C4-C6 ACDF by Dr. 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 Historically left C6-C7 interlaminar epidural injections have provided good relief, she is continuing to have recurrence, she had 2 epidurals, proceeding with #3, patient prefers Dr. Jeralyn Ruths.  Chronic process with exacerbation and pharmacologic intervention  ____________________________________________ Gwen Her. Dianah Field, M.D., ABFM., CAQSM., AME. Primary Care and Sports Medicine [facility]  Adjunct Professor of [place] of [facility] of Medicine  Risk manager

## 2021-09-03 NOTE — Assessment & Plan Note (Addendum)
Sherri [name] also has chronic neck pain, she is post C4-C6 ACDF by Dr. 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 Historically left C6-C7 interlaminar epidural injections have provided good relief, she is continuing to have recurrence, she had 2 epidurals, proceeding with #3, patient prefers Dr. Jeralyn Ruths.

## 2021-09-03 NOTE — Assessment & Plan Note (Signed)
Pleasant 66 year old female, 50 years ago she had what sounds to be an ORIF of ankle fracture, now with pain, gelling, mostly medial and lateral tibiotalar. Adding x-rays, topical Voltaren, home conditioning. Return to see me in about a month, we will do an ankle joint injection if not better.

## 2021-09-05 ENCOUNTER — Encounter: Payer: Self-pay | Admitting: General Practice

## 2021-09-12 ENCOUNTER — Other Ambulatory Visit: Payer: Medicare Other

## 2021-09-13 ENCOUNTER — Ambulatory Visit
Admission: RE | Admit: 2021-09-13 | Discharge: 2021-09-13 | Disposition: A | Discharge status: Home / Self Care | Payer: Medicare Other | Source: Ambulatory Visit | Attending: Sports Medicine | Admitting: Sports Medicine

## 2021-09-13 DIAGNOSIS — Q761 Klippel-Feil syndrome: Secondary | ICD-10-CM

## 2021-09-13 DIAGNOSIS — M47812 Spondylosis without myelopathy or radiculopathy, cervical region: Secondary | ICD-10-CM | POA: Diagnosis not present

## 2021-09-13 MED ORDER — IOPAMIDOL (ISOVUE-M 300) INJECTION 61%
1.0000 mL | Freq: Once | INTRAMUSCULAR | Status: AC
  Administered 2021-09-13: 1 mL via EPIDURAL

## 2021-09-13 MED ORDER — TRIAMCINOLONE ACETONIDE 40 MG/ML IJ SUSP (RADIOLOGY)
60.0000 mg | Freq: Once | INTRAMUSCULAR | Status: AC
  Administered 2021-09-13: 60 mg via EPIDURAL

## 2021-09-13 NOTE — Discharge Instructions (Signed)

## 2021-10-01 ENCOUNTER — Ambulatory Visit (INDEPENDENT_AMBULATORY_CARE_PROVIDER_SITE_OTHER): Payer: Medicare Other | Admitting: Sports Medicine

## 2021-10-01 DIAGNOSIS — M19271 Secondary osteoarthritis, right ankle and foot: Secondary | ICD-10-CM | POA: Diagnosis not present

## 2021-10-01 MED ORDER — TRIAZOLAM 0.25 MG PO TABS: ORAL_TABLET | ORAL | 0 refills | Status: DC

## 2021-10-01 NOTE — Assessment & Plan Note (Signed)
Pleasant 66 year old female, severe ankle osteoarthritis, insufficient improvement with topical Voltaren. Needle phobic so we will give her some triazolam at a follow-up visit and then do the injection with sedation.

## 2021-10-01 NOTE — Progress Notes (Signed)
    Procedures performed today:    None.  Independent interpretation of notes and tests performed by another provider:   None.  Brief History, Exam, Impression, and Recommendations:    Secondary osteoarthritis of right ankle Pleasant 66 year old female, severe ankle osteoarthritis, insufficient improvement with topical Voltaren. Needle phobic so we will give her some triazolam at a follow-up visit and then do the injection with sedation.    ____________________________________________ Gwen Her. Dianah Field, M.D., ABFM., CAQSM., AME. Primary Care and Sports Medicine [place] [facility]  Adjunct Professor of [place] of [facility] of Medicine  Risk manager

## 2021-10-08 ENCOUNTER — Ambulatory Visit: Payer: Medicare Other | Admitting: Family Medicine

## 2021-10-15 ENCOUNTER — Ambulatory Visit (INDEPENDENT_AMBULATORY_CARE_PROVIDER_SITE_OTHER): Payer: Medicare Other

## 2021-10-15 ENCOUNTER — Ambulatory Visit (INDEPENDENT_AMBULATORY_CARE_PROVIDER_SITE_OTHER): Payer: Medicare Other | Admitting: Sports Medicine

## 2021-10-15 DIAGNOSIS — M19271 Secondary osteoarthritis, right ankle and foot: Secondary | ICD-10-CM

## 2021-10-15 NOTE — Assessment & Plan Note (Signed)
This is a very pleasant 66 year old female, severe ankle osteoarthritis, she is post ORIF of malleolar fractures. We injected her ankle today with triazolam sedation. She did really well, I really do not think she is going to need triazolam again if we do another ankle injection. Return to see me in 4 to 6 weeks.

## 2021-10-15 NOTE — Progress Notes (Signed)
    Procedures performed today:    Procedure: Real-time Ultrasound Guided injection of the right ankle joint Device: Samsung HS60  Verbal informed consent obtained.  Time-out conducted.  Noted no overlying erythema, induration, or other signs of local infection.  Skin prepped in a sterile fashion.  Local anesthesia: Topical Ethyl chloride.  With sterile technique and under real time ultrasound guidance: Noted severe osteoarthritis, I advanced a 25-gauge needle into the joint from a lateral approach and injected 1 cc lidocaine, 1 cc bupivacaine, 1 cc kenalog 40. Completed without difficulty  Advised to call if fevers/chills, erythema, induration, drainage, or persistent bleeding.  Images permanently stored and available for review in PACS.  Impression: Technically successful ultrasound guided injection.  Independent interpretation of notes and tests performed by another provider:   None.  Brief History, Exam, Impression, and Recommendations:    Secondary osteoarthritis of right ankle This is a very pleasant 66 year old female, severe ankle osteoarthritis, she is post ORIF of malleolar fractures. We injected her ankle today with triazolam sedation. She did really well, I really do not think she is going to need triazolam again if we do another ankle injection. Return to see me in 4 to 6 weeks.    ____________________________________________ Gwen Her. Dianah Field, M.D., ABFM., CAQSM., AME. Primary Care and Sports Medicine [place] [facility]  Adjunct Professor of [place] of [facility] of Medicine  Risk manager

## 2021-10-24 ENCOUNTER — Ambulatory Visit (INDEPENDENT_AMBULATORY_CARE_PROVIDER_SITE_OTHER): Payer: Medicare Other | Admitting: Family Medicine

## 2021-10-24 VITALS — BP 156/82 | HR 64 | Ht 65.0 in | Wt 142.0 lb

## 2021-10-24 DIAGNOSIS — Z1159 Encounter for screening for other viral diseases: Secondary | ICD-10-CM

## 2021-10-24 DIAGNOSIS — Z780 Asymptomatic menopausal state: Secondary | ICD-10-CM | POA: Diagnosis not present

## 2021-10-24 DIAGNOSIS — Z1231 Encounter for screening mammogram for malignant neoplasm of breast: Secondary | ICD-10-CM

## 2021-10-24 DIAGNOSIS — F331 Major depressive disorder, recurrent, moderate: Secondary | ICD-10-CM

## 2021-10-24 DIAGNOSIS — J439 Emphysema, unspecified: Secondary | ICD-10-CM | POA: Diagnosis not present

## 2021-10-24 DIAGNOSIS — D692 Other nonthrombocytopenic purpura: Secondary | ICD-10-CM | POA: Diagnosis not present

## 2021-10-24 DIAGNOSIS — R351 Nocturia: Secondary | ICD-10-CM

## 2021-10-24 DIAGNOSIS — I10 Essential (primary) hypertension: Secondary | ICD-10-CM

## 2021-10-24 DIAGNOSIS — Z23 Encounter for immunization: Secondary | ICD-10-CM

## 2021-10-24 MED ORDER — LOSARTAN POTASSIUM-HCTZ 100-25 MG PO TABS: 1.0000 | ORAL_TABLET | Freq: Every day | ORAL | 1 refills | Status: DC

## 2021-10-24 MED ORDER — UMECLIDINIUM-VILANTEROL 62.5-25 MCG/ACT IN AEPB: 1.0000 | INHALATION_SPRAY | Freq: Every day | RESPIRATORY_TRACT | 2 refills | Status: DC

## 2021-10-24 MED ORDER — PREDNISONE 20 MG PO TABS: 40.0000 mg | ORAL_TABLET | Freq: Every day | ORAL | 0 refills | Status: DC

## 2021-10-24 NOTE — Assessment & Plan Note (Signed)
Discussed benign nature of condition.

## 2021-10-24 NOTE — Assessment & Plan Note (Addendum)
Uncontrolled.  Will add losartan to her HCTZ.  Follow-up to see if improving I am hoping if we can get her blood pressure under better control that the headaches will improve as well.

## 2021-10-24 NOTE — Assessment & Plan Note (Addendum)
Stiolto not currently being covered.  We can see if Anoro might be covered better.  It looks like most of them are falling into the same category but we can certainly try and see if it is less expensive.  New prescription sent to pharmacy.  He also has some thing on exam today so we will treat with prednisone.

## 2021-10-24 NOTE — Patient Instructions (Signed)
I Sherri [name] put you on a little bit of prednisone for 5 days just to see if this helps with your sinuses.  And give you some temporary relief.

## 2021-10-24 NOTE — Assessment & Plan Note (Signed)
Continue current regimen with fluoxetine.

## 2021-10-24 NOTE — Progress Notes (Signed)
Established Patient Office Visit  Subjective   Patient ID: Sherri [name], female    DOB: [birth year]  Age: 66 y.o. MRN: [id number]  Chief Complaint  Patient presents with   Follow-up    HPI  Hypertension- Pt denies chest pain, SOB, dizziness, or heart palpitations.  Taking meds as directed w/o problems.  Denies medication side effects.    Follow-up major depressive disorder.  Currently on fluoxetine 60 mg total daily.  Hyperlipidemia - tolerating stating well with no myalgias or significant side effects.  Lab Results  Component Value Date   CHOL 276 (H) 06/07/2020   HDL 66 06/07/2020   LDLCALC 187 (H) 06/07/2020   TRIG 106 06/07/2020   CHOLHDL 4.2 06/07/2020   She also reports that she oftentimes wakes up 1-2 times at night to urinate.  She is always afraid she might have an accident in the bed.  COPD-she was given a prescription for Stiolto but her insurance does not seem to be covering it well it is quite expensive and she had a hard time getting it.  Also reports that she has been having some frequent headaches.  They are usually behind her eyes with a lot of pressure a lot of sinus congestion and clear nasal drainage.  She also gets easy bruising on her arms and would like me to look at that today.    ROS    Objective:     BP (!) 156/82   Pulse 64   Ht '5\' 5"'$  (1.651 m)   Wt 142 lb (64.4 kg)   SpO2 94%   BMI 23.63 kg/m    Physical Exam Vitals and nursing note reviewed.  Constitutional:      Appearance: She is well-developed.  HENT:     Head: Normocephalic and atraumatic.  Cardiovascular:     Rate and Rhythm: Normal rate and regular rhythm.     Heart sounds: Normal heart sounds.  Pulmonary:     Effort: Pulmonary effort is normal.     Breath sounds: Wheezing present.     Comments: Wheezing is worse on the right compared to the left.  Skin:    General: Skin is warm and dry.  Neurological:     Mental Status: She is alert and oriented to person,  place, and time.  Psychiatric:        Behavior: Behavior normal.      No results found for any visits on 10/24/21.    The 10-year ASCVD risk score (Arnett DK, et al., 2019) is: 24.2%    Assessment & Plan:   Problem List Items Addressed This Visit       Cardiovascular and Mediastinum   Senile purpura ([place])    Discussed benign nature of condition.      Relevant Medications   losartan-hydrochlorothiazide (HYZAAR) 100-25 MG tablet   Essential hypertension - Primary    Uncontrolled.  Will add losartan to her HCTZ.  Follow-up to see if improving I am hoping if we can get her blood pressure under better control that the headaches will improve as well.      Relevant Medications   losartan-hydrochlorothiazide (HYZAAR) 100-25 MG tablet   Other Relevant Orders   Lipid Panel w/reflex Direct LDL   COMPLETE METABOLIC PANEL WITH GFR   CBC     Respiratory   Pulmonary emphysema ([place])    Stiolto not currently being covered.  We can see if Anoro might be covered better.  It looks like most of them are  falling into the same category but we can certainly try and see if it is less expensive.  New prescription sent to pharmacy.      Relevant Medications   predniSONE (DELTASONE) 20 MG tablet   umeclidinium-vilanterol (ANORO ELLIPTA) 62.5-25 MCG/ACT AEPB     Other   MDD (major depressive disorder)    Continue current regimen with fluoxetine.      Other Visit Diagnoses     Need for hepatitis C screening test       Relevant Orders   Hepatitis C Antibody   Flu vaccine need       Relevant Orders   Flu Vaccine QUAD High Dose(Fluad) (Completed)   Screening mammogram for breast cancer       Relevant Orders   MM 3D SCREEN BREAST BILATERAL   Post-menopausal       Relevant Orders   DG Bone Density   Nocturia          Headaches-I definitely think it could be secondary to allergic rhinitis it sounds like she has had some pretty severe allergy symptoms and has been still taking her  allergy medication including her Singulair and nasal steroid spray.  We will do a round of prednisone to see if this helps.  Also will work on getting her blood pressure down.  This may help with headaches as well.   Return in about 2 weeks (around 11/07/2021) for Nurse visit for BP .    Beatrice Lecher, MD

## 2021-10-25 LAB — HEPATITIS C ANTIBODY: Hepatitis C Ab: NONREACTIVE

## 2021-10-25 LAB — COMPLETE METABOLIC PANEL WITH GFR
AG Ratio: 2 (calc) (ref 1.0–2.5)
ALT: 17 U/L (ref 6–29)
AST: 18 U/L (ref 10–35)
Albumin: 4.5 g/dL (ref 3.6–5.1)
Alkaline phosphatase (APISO): 74 U/L (ref 37–153)
BUN: 12 mg/dL (ref 7–25)
CO2: 32 mmol/L (ref 20–32)
Calcium: 9.7 mg/dL (ref 8.6–10.4)
Chloride: 101 mmol/L (ref 98–110)
Creat: 0.72 mg/dL (ref 0.50–1.05)
Globulin: 2.2 g/dL (calc) (ref 1.9–3.7)
Glucose, Bld: 103 mg/dL — ABNORMAL HIGH (ref 65–99)
Potassium: 3.9 mmol/L (ref 3.5–5.3)
Sodium: 140 mmol/L (ref 135–146)
Total Bilirubin: 0.7 mg/dL (ref 0.2–1.2)
Total Protein: 6.7 g/dL (ref 6.1–8.1)
eGFR: 92 mL/min/{1.73_m2} (ref >=60)

## 2021-10-25 LAB — CBC
HCT: 44.7 % (ref 35.0–45.0)
Hemoglobin: 15.6 g/dL — ABNORMAL HIGH (ref 11.7–15.5)
MCH: 34.6 pg — ABNORMAL HIGH (ref 27.0–33.0)
MCHC: 34.9 g/dL (ref 32.0–36.0)
MCV: 99.1 fL (ref 80.0–100.0)
MPV: 9.4 fL (ref 7.5–12.5)
Platelets: 381 10*3/uL (ref 140–400)
RBC: 4.51 10*6/uL (ref 3.80–5.10)
RDW: 11.4 % (ref 11.0–15.0)
WBC: 6.7 10*3/uL (ref 3.8–10.8)

## 2021-10-25 LAB — LIPID PANEL W/REFLEX DIRECT LDL
Cholesterol: 201 mg/dL — ABNORMAL HIGH (ref <200)
HDL: 113 mg/dL (ref >=50)
LDL Cholesterol (Calc): 73 mg/dL (calc)
Non-HDL Cholesterol (Calc): 88 mg/dL (calc) (ref <130)
Total CHOL/HDL Ratio: 1.8 (calc) (ref <5.0)
Triglycerides: 69 mg/dL (ref <150)

## 2021-10-25 NOTE — Progress Notes (Signed)
HI Sherri [name],  LDL cholesterol looks much better compared to last year.  Lets continue with current regimen.  Your metabolic panel looks great.  Hemoglobin is stable.  Negative for hepatitis C.

## 2021-11-07 ENCOUNTER — Ambulatory Visit (INDEPENDENT_AMBULATORY_CARE_PROVIDER_SITE_OTHER): Payer: Medicare Other | Admitting: Family Medicine

## 2021-11-07 VITALS — BP 147/72 | HR 60 | Ht 65.0 in

## 2021-11-07 DIAGNOSIS — I10 Essential (primary) hypertension: Secondary | ICD-10-CM

## 2021-11-07 MED ORDER — AMLODIPINE BESYLATE 2.5 MG PO TABS: 2.5000 mg | ORAL_TABLET | Freq: Every day | ORAL | 0 refills | Status: DC

## 2021-11-07 NOTE — Progress Notes (Signed)
   Established Patient Office Visit  Subjective   Patient ID: Sherri [name], female    DOB: [birth year]  Age: 66 y.o. MRN: [id number]  Chief Complaint  Patient presents with   Hypertension    Nurse visit - BP check after starting Losartan    HPI  Hypertension- nurse visit BP check . Pt denies chest pain, shortness of breath, or palpitations. She states she has chronic dizziness but no change in this since starting the new medication.   ROS    Objective:     BP (!) 147/72   Pulse 60   Ht '5\' 5"'$  (1.651 m)   SpO2 97%   BMI 23.63 kg/m    Physical Exam   No results found for any visits on 11/07/21.    The ASCVD Risk score (Arnett DK, et al., 2019) failed to calculate for the following reasons:   The valid HDL cholesterol range is 20 to 100 mg/dL    Assessment & Plan:  Hypertension- BP check initial reading 159/79. Second reading 147/72. Dr. Madilyn Fireman  added new medication. Amlodipine 2.'5mg'$   taken once daily - Return in 3 weeks for  nurse visit for BP check after starting the new medication.  Problem List Items Addressed This Visit       Cardiovascular and Mediastinum   Essential hypertension - Primary    Return in about 3 weeks (around 11/28/2021) for nurse visit for BP check .    Rae Lips, LPN

## 2021-11-07 NOTE — Progress Notes (Signed)
Agree with documentation as above.   Sherri Lecher, MD  Meds ordered this encounter  Medications   amLODipine (NORVASC) 2.5 MG tablet    Sig: Take 1 tablet (2.5 mg total) by mouth daily.    Dispense:  90 tablet    Refill:  0

## 2021-11-07 NOTE — Patient Instructions (Signed)
Dr. Madilyn Fireman states to start Amlodipine 2.'5mg'$  once daily. Return in 3 weeks for  nurse visit to recheck BP .

## 2021-11-13 ENCOUNTER — Other Ambulatory Visit: Payer: Self-pay | Admitting: Family Medicine

## 2021-11-21 ENCOUNTER — Other Ambulatory Visit: Payer: Self-pay | Admitting: Family Medicine

## 2021-11-28 ENCOUNTER — Ambulatory Visit (INDEPENDENT_AMBULATORY_CARE_PROVIDER_SITE_OTHER): Payer: Medicare Other | Admitting: Physician Assistant

## 2021-11-28 VITALS — BP 119/65 | HR 59 | Resp 20

## 2021-11-28 DIAGNOSIS — I10 Essential (primary) hypertension: Secondary | ICD-10-CM

## 2021-11-28 NOTE — Progress Notes (Signed)
Agree with above plan. 

## 2021-11-28 NOTE — Progress Notes (Signed)
   Subjective:    Patient ID: Sherri [name], female    DOB: Apr 30, 1955, 66 y.o.   MRN: [id number]  HPI  Patient is here today for blood pressure check. Denies trouble sleeping, palpitations or medication problems.  Review of Systems     Objective:   Physical Exam        Assessment & Plan:   Patient advised to schedule a follow up appointment with PCP in 6 months.

## 2021-11-30 ENCOUNTER — Other Ambulatory Visit: Payer: Self-pay | Admitting: Family Medicine

## 2021-12-10 ENCOUNTER — Ambulatory Visit (INDEPENDENT_AMBULATORY_CARE_PROVIDER_SITE_OTHER): Payer: Medicare Other | Admitting: Sports Medicine

## 2021-12-10 DIAGNOSIS — Q761 Klippel-Feil syndrome: Secondary | ICD-10-CM

## 2021-12-10 MED ORDER — TRAMADOL HCL 50 MG PO TABS: 50.0000 mg | ORAL_TABLET | Freq: Three times a day (TID) | ORAL | 0 refills | Status: DC | PRN

## 2021-12-10 NOTE — Assessment & Plan Note (Signed)
This is a very pleasant 66 year old female, she is post C4-C6 ACDF by Dr. 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 Prefers Dr. Jeralyn Ruths. She just came off prednisone, so we will add a course of tramadol to hold her over until her epidural.

## 2021-12-10 NOTE — Progress Notes (Signed)
    Procedures performed today:    None.  Independent interpretation of notes and tests performed by another provider:   None.  Brief History, Exam, Impression, and Recommendations:    Cervical fusion syndrome This is a very pleasant 66 year old female, she is post C4-C6 ACDF by Dr. 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 Prefers Dr. Jeralyn Ruths. She just came off prednisone, so we will add a course of tramadol to hold [name] over until [name] epidural.  Chronic process with exacerbation and pharmacologic intervention  ____________________________________________ Sherri [name]. Sherri [name], M.D., ABFM., CAQSM., AME. Primary Care and Sports Medicine [place] [facility]  Adjunct Professor of [place] of [facility] of Medicine  Risk manager

## 2021-12-13 ENCOUNTER — Telehealth: Payer: Self-pay

## 2021-12-13 NOTE — Telephone Encounter (Signed)
[  Patient left VM and states could you send something else in cause the tramadol isn't working please advise.

## 2021-12-14 NOTE — Telephone Encounter (Signed)
Called and spoke to patient and she agreed to Dr. Darene Lamer directives task complete.

## 2021-12-14 NOTE — Telephone Encounter (Signed)
Increase tramadol dosing to 100 mg (2 tabs) 3 times a day, nothing stronger for now until after the epidural.  If she needs a refill and happy to do it.

## 2021-12-25 ENCOUNTER — Ambulatory Visit
Admission: RE | Admit: 2021-12-25 | Discharge: 2021-12-25 | Disposition: A | Discharge status: Home / Self Care | Payer: Medicare Other | Source: Ambulatory Visit | Attending: Sports Medicine | Admitting: Sports Medicine

## 2021-12-25 DIAGNOSIS — Q761 Klippel-Feil syndrome: Secondary | ICD-10-CM

## 2021-12-25 DIAGNOSIS — M47812 Spondylosis without myelopathy or radiculopathy, cervical region: Secondary | ICD-10-CM | POA: Diagnosis not present

## 2021-12-25 MED ORDER — TRIAMCINOLONE ACETONIDE 40 MG/ML IJ SUSP (RADIOLOGY)
60.0000 mg | Freq: Once | INTRAMUSCULAR | Status: AC
  Administered 2021-12-25: 60 mg via EPIDURAL

## 2021-12-25 MED ORDER — IOPAMIDOL (ISOVUE-M 300) INJECTION 61%
1.0000 mL | Freq: Once | INTRAMUSCULAR | Status: AC | PRN
  Administered 2021-12-25: 1 mL via EPIDURAL

## 2021-12-25 NOTE — Discharge Instructions (Signed)

## 2021-12-26 ENCOUNTER — Telehealth: Payer: Self-pay

## 2021-12-26 DIAGNOSIS — Q761 Klippel-Feil syndrome: Secondary | ICD-10-CM

## 2021-12-26 NOTE — Telephone Encounter (Signed)
She has tramadol, half to 2 pills 2-3 times a day.

## 2021-12-26 NOTE — Telephone Encounter (Signed)
Patient states that she had spinal injection done yesterday and is having 10/10 pain and is requesting a medication that will help her manage it. Please send prescription to  [facility] on [address].

## 2021-12-27 MED ORDER — TRAMADOL HCL 50 MG PO TABS: 50.0000 mg | ORAL_TABLET | Freq: Three times a day (TID) | ORAL | 0 refills | Status: DC | PRN

## 2021-12-27 NOTE — Addendum Note (Signed)
Addended by: Silverio Decamp on: 12/27/2021 11:10 AM   Modules accepted: Orders

## 2021-12-27 NOTE — Telephone Encounter (Signed)
Refilling

## 2021-12-27 NOTE — Telephone Encounter (Signed)
Patient has been notified

## 2022-01-07 ENCOUNTER — Other Ambulatory Visit: Payer: Self-pay | Admitting: Family Medicine

## 2022-01-08 ENCOUNTER — Encounter: Payer: Self-pay | Admitting: Family Medicine

## 2022-01-08 ENCOUNTER — Telehealth (INDEPENDENT_AMBULATORY_CARE_PROVIDER_SITE_OTHER): Payer: Medicare Other | Admitting: Family Medicine

## 2022-01-08 DIAGNOSIS — J111 Influenza due to unidentified influenza virus with other respiratory manifestations: Secondary | ICD-10-CM | POA: Diagnosis not present

## 2022-01-08 MED ORDER — OSELTAMIVIR PHOSPHATE 75 MG PO CAPS: 75.0000 mg | ORAL_CAPSULE | Freq: Two times a day (BID) | ORAL | 0 refills | Status: DC

## 2022-01-08 MED ORDER — OSELTAMIVIR PHOSPHATE 6 MG/ML PO SUSR: 75.0000 mg | Freq: Two times a day (BID) | ORAL | 0 refills | Status: AC

## 2022-01-08 NOTE — Progress Notes (Signed)
Pharmacy didn't have capsules available.  Liquid sent.

## 2022-01-08 NOTE — Progress Notes (Signed)
Pt reports that she is unsure if she has flu. She took a Covid test Saturday  and it was negative. She stated that her sister was seen at the [facility] and was positive for flu. She said that this is the 4th day that she has felt bad. She reports her sxs sinus headache, productive cough, nasal congestion. She hasn't had a fever in the last 24 hours. Her temp was 102 at the highest on Saturday she said that was her worst day.  Took Alka-seltzer plus, IBU, inhaler, and hydrating.

## 2022-01-08 NOTE — Addendum Note (Signed)
Addended by: Beatrice Lecher D on: 01/08/2022 01:44 PM   Modules accepted: Orders

## 2022-01-08 NOTE — Progress Notes (Signed)
    Virtual Visit via Video Note  I connected with Sherri [name] on 01/08/22 at 11:30 AM EST by a video enabled telemedicine application and verified that I am speaking with the correct person using two identifiers.   I discussed the limitations of evaluation and management by telemedicine and the availability of in person appointments. The patient expressed understanding and agreed to proceed.  Patient location: at home Provider location: in office  Subjective:    CC:   Chief Complaint  Patient presents with   Influenza    HPI: Pt reports that she is unsure if she has flu. She took a Covid test Saturday  and it was negative. She stated that her sister was seen at the [facility] and was positive for flu. She said that this is the 4th day that she has felt bad. She reports her sxs sinus headache, productive cough, nasal congestion. She hasn't had a fever in the last 24 hours. Her temp was 102 at the highest on Saturday she said that was her worst day.   Took Alka-seltzer plus, IBU, inhaler, and hydrating.   Past medical history, Surgical history, Family history not pertinant except as noted below, Social history, Allergies, and medications have been entered into the medical record, reviewed, and corrections made.    Objective:    General: Speaking clearly in complete sentences without any shortness of breath.  Alert and oriented x3.  Normal judgment. No apparent acute distress.    Impression and Recommendations:    Problem List Items Addressed This Visit   None Visit Diagnoses     Influenza    -  Primary   Relevant Medications   oseltamivir (TAMIFLU) 75 MG capsule      Most likely fluid with sister testing positive.  She is already on day 4 so we discussed the efficacy of Tamiflu at this point may be less than if she had started earlier.  But she would like to try to take the medication to see if it helps improve her congestion and ear ringing and headache.  Prescription sent  to pharmacy.  Please let me know if any problems or concerns.  Please call back if not better in 1 week.  No orders of the defined types were placed in this encounter.   Meds ordered this encounter  Medications   oseltamivir (TAMIFLU) 75 MG capsule    Sig: Take 1 capsule (75 mg total) by mouth 2 (two) times daily.    Dispense:  10 capsule    Refill:  0     I discussed the assessment and treatment plan with the patient. The patient was provided an opportunity to ask questions and all were answered. The patient agreed with the plan and demonstrated an understanding of the instructions.   The patient was advised to call back or seek an in-person evaluation if the symptoms worsen or if the condition fails to improve as anticipated.   Beatrice Lecher, MD

## 2022-01-15 ENCOUNTER — Ambulatory Visit: Payer: Medicare Other | Admitting: Sports Medicine

## 2022-01-21 ENCOUNTER — Telehealth: Payer: Self-pay

## 2022-01-21 ENCOUNTER — Encounter: Payer: Self-pay | Admitting: Sports Medicine

## 2022-01-21 ENCOUNTER — Telehealth (INDEPENDENT_AMBULATORY_CARE_PROVIDER_SITE_OTHER): Payer: Medicare Other | Admitting: Sports Medicine

## 2022-01-21 DIAGNOSIS — Q761 Klippel-Feil syndrome: Secondary | ICD-10-CM

## 2022-01-21 MED ORDER — PREDNISONE 20 MG PO TABS: 40.0000 mg | ORAL_TABLET | Freq: Every day | ORAL | 0 refills | Status: DC

## 2022-01-21 MED ORDER — OXYCODONE-ACETAMINOPHEN 5-325 MG PO TABS: 1.0000 | ORAL_TABLET | Freq: Three times a day (TID) | ORAL | 0 refills | Status: DC | PRN

## 2022-01-21 NOTE — Telephone Encounter (Signed)
Med sent. If not better by end of week, needs appt

## 2022-01-21 NOTE — Progress Notes (Signed)
   Virtual Visit via Telephone   I connected with  [place]  on 01/21/22 by telephone/telehealth and verified that I am speaking with the correct person using two identifiers.   I discussed the limitations, risks, security and privacy concerns of performing an evaluation and management service by telephone, including the higher likelihood of inaccurate diagnosis and treatment, and the availability of in person appointments.  We also discussed the likely need of an additional face to face encounter for complete and high quality delivery of care.  I also discussed with the patient that there may be a patient responsible charge related to this service. The patient expressed understanding and wishes to proceed.  Provider location is in medical facility. Patient location is at their home, different from provider location. People involved in care of the patient during this telehealth encounter were myself, my nurse/medical assistant, and my front office/scheduling team member.  Review of Systems: No fevers, chills, night sweats, weight loss, chest pain, or shortness of breath.   Objective Findings:    General: Speaking full sentences, no audible heavy breathing.  Sounds alert and appropriately interactive.    Independent interpretation of tests performed by another provider:   None.  Brief History, Exam, Impression, and Recommendations:    Cervical fusion syndrome This is a very pleasant 67 year old female, she is post C4-C6 ACDF by Dr. Trenton Gammon, she does have some adjacent level disease C6-T1, historically C6-C7 interlaminar epidurals have provided good relief, unfortunately efficacy is waning, she had a series of 3 epidural starting in June 2023, she had another epidural in December, unfortunately did not get any relief. She continues gabapentin moderate dose with minimal relief. I do think we need an additional surgical consultation from Dr. Trenton Gammon, I am going to give her a bit of  oxycodone to hold her over in the meantime.   I discussed the above assessment and treatment plan with the patient. The patient was provided an opportunity to ask questions and all were answered. The patient agreed with the plan and demonstrated an understanding of the instructions.   The patient was advised to call back or seek an in-person evaluation if the symptoms worsen or if the condition fails to improve as anticipated.   I provided 30 minutes of verbal and non-verbal time during this encounter date, time was needed to gather information, review chart, records, communicate/coordinate with staff remotely, as well as complete documentation.   ____________________________________________ Gwen Her. Dianah Field, M.D., ABFM., CAQSM., AME. Primary Care and Sports Medicine [place] [facility]  Adjunct Professor of [place] of [facility] of Medicine  Risk manager

## 2022-01-21 NOTE — Telephone Encounter (Signed)
Pt advised. She voiced understanding and agreed.

## 2022-01-21 NOTE — Assessment & Plan Note (Signed)
This is a very pleasant 67 year old female, she is post C4-C6 ACDF by Dr. Trenton Gammon, she does have some adjacent level disease C6-T1, historically C6-C7 interlaminar epidurals have provided good relief, unfortunately efficacy is waning, she had a series of 3 epidural starting in June 2023, she had another epidural in December, unfortunately did not get any relief. She continues gabapentin moderate dose with minimal relief. I do think we need an additional surgical consultation from Dr. Trenton Gammon, I am going to give her a bit of oxycodone to hold her over in the meantime.

## 2022-01-21 NOTE — Telephone Encounter (Signed)
Sherri [name] called and left a message stating she is still sick and is requesting prednisone.

## 2022-01-25 ENCOUNTER — Ambulatory Visit (INDEPENDENT_AMBULATORY_CARE_PROVIDER_SITE_OTHER): Payer: Medicare Other | Admitting: Family Medicine

## 2022-01-25 ENCOUNTER — Encounter: Payer: Self-pay | Admitting: Family Medicine

## 2022-01-25 VITALS — BP 113/52 | HR 72

## 2022-01-25 DIAGNOSIS — J22 Unspecified acute lower respiratory infection: Secondary | ICD-10-CM | POA: Diagnosis not present

## 2022-01-25 MED ORDER — DOXYCYCLINE HYCLATE 100 MG PO TABS: 100.0000 mg | ORAL_TABLET | Freq: Two times a day (BID) | ORAL | 0 refills | Status: DC

## 2022-01-25 NOTE — Progress Notes (Signed)
   Acute Office Visit  Subjective:     Patient ID: Sherri [name], female    DOB: 1955-03-16, 67 y.o.   MRN: [id number]  Chief Complaint  Patient presents with   Cough    She has 1 day left on Prednisone   Fatigue    HPI Patient is in today for cough and chest congestion.  It started with the flu about 3 weeks ago she completed the Tamiflu but continued to cough and have chest congestion.  She is been having a lot of headaches.  No GI symptoms.  No recent fever but she just cannot shake it she says she is not able to rest at night because of the cough.  We also sent in prednisone to see if that would help and it has not infectious and was completed the prednisone show he has 1 tab left.  ROS      Objective:    BP (!) 113/52   Pulse 72   SpO2 95%    Physical Exam Constitutional:      Appearance: She is well-developed.  HENT:     Head: Normocephalic and atraumatic.     Right Ear: Tympanic membrane, ear canal and external ear normal.     Left Ear: Tympanic membrane, ear canal and external ear normal.     Nose: Nose normal.     Mouth/Throat:     Pharynx: Oropharynx is clear.  Eyes:     Conjunctiva/sclera: Conjunctivae normal.     Pupils: Pupils are equal, round, and reactive to light.  Neck:     Thyroid: No thyromegaly.  Cardiovascular:     Rate and Rhythm: Normal rate and regular rhythm.     Heart sounds: Normal heart sounds.  Pulmonary:     Effort: Pulmonary effort is normal.     Breath sounds: Wheezing present.     Comments: Diffuse rhonchi and expiratory wheezing.  No crackles. Musculoskeletal:     Cervical back: Neck supple.  Lymphadenopathy:     Cervical: No cervical adenopathy.  Skin:    General: Skin is warm and dry.  Neurological:     Mental Status: She is alert and oriented to person, place, and time.     No results found for any visits on 01/25/22.      Assessment & Plan:   Problem List Items Addressed This Visit   None Visit Diagnoses      Lower respiratory infection    -  Primary      Lower respiratory tract infection secondary to flu.  She has been persistently sick for 3 weeks at this point and not responding to prednisone.  She has been using her Norco and her regular albuterol.  Will treat with doxycycline.  If not better in 1 week please let us know.  Meds ordered this encounter  Medications   doxycycline (VIBRA-TABS) 100 MG tablet    Sig: Take 1 tablet (100 mg total) by mouth 2 (two) times daily.    Dispense:  14 tablet    Refill:  0    No follow-ups on file.  Beatrice Lecher, MD

## 2022-02-03 ENCOUNTER — Other Ambulatory Visit: Payer: Self-pay | Admitting: Family Medicine

## 2022-02-03 DIAGNOSIS — I10 Essential (primary) hypertension: Secondary | ICD-10-CM

## 2022-02-18 ENCOUNTER — Other Ambulatory Visit: Payer: Self-pay | Admitting: Sports Medicine

## 2022-02-18 DIAGNOSIS — Q761 Klippel-Feil syndrome: Secondary | ICD-10-CM

## 2022-02-19 DIAGNOSIS — Z79899 Other long term (current) drug therapy: Secondary | ICD-10-CM | POA: Diagnosis not present

## 2022-02-19 DIAGNOSIS — S7002XA Contusion of left hip, initial encounter: Secondary | ICD-10-CM | POA: Diagnosis not present

## 2022-02-19 DIAGNOSIS — S0003XA Contusion of scalp, initial encounter: Secondary | ICD-10-CM | POA: Diagnosis not present

## 2022-02-19 DIAGNOSIS — M25552 Pain in left hip: Secondary | ICD-10-CM | POA: Diagnosis not present

## 2022-02-19 DIAGNOSIS — S0083XA Contusion of other part of head, initial encounter: Secondary | ICD-10-CM | POA: Diagnosis not present

## 2022-02-19 DIAGNOSIS — R296 Repeated falls: Secondary | ICD-10-CM | POA: Diagnosis not present

## 2022-02-19 DIAGNOSIS — F17210 Nicotine dependence, cigarettes, uncomplicated: Secondary | ICD-10-CM | POA: Diagnosis not present

## 2022-02-19 DIAGNOSIS — J449 Chronic obstructive pulmonary disease, unspecified: Secondary | ICD-10-CM | POA: Diagnosis not present

## 2022-02-19 DIAGNOSIS — Z880 Allergy status to penicillin: Secondary | ICD-10-CM | POA: Diagnosis not present

## 2022-02-19 DIAGNOSIS — Z7951 Long term (current) use of inhaled steroids: Secondary | ICD-10-CM | POA: Diagnosis not present

## 2022-02-19 DIAGNOSIS — Z791 Long term (current) use of non-steroidal anti-inflammatories (NSAID): Secondary | ICD-10-CM | POA: Diagnosis not present

## 2022-02-20 ENCOUNTER — Other Ambulatory Visit: Payer: Self-pay | Admitting: *Deleted

## 2022-02-20 DIAGNOSIS — Q761 Klippel-Feil syndrome: Secondary | ICD-10-CM

## 2022-02-20 DIAGNOSIS — M4692 Unspecified inflammatory spondylopathy, cervical region: Secondary | ICD-10-CM | POA: Diagnosis not present

## 2022-02-20 DIAGNOSIS — M5412 Radiculopathy, cervical region: Secondary | ICD-10-CM | POA: Diagnosis not present

## 2022-02-20 MED ORDER — GABAPENTIN 300 MG PO CAPS: ORAL_CAPSULE | ORAL | 3 refills | Status: DC

## 2022-02-25 ENCOUNTER — Ambulatory Visit (INDEPENDENT_AMBULATORY_CARE_PROVIDER_SITE_OTHER): Payer: Medicare Other

## 2022-02-25 ENCOUNTER — Ambulatory Visit (INDEPENDENT_AMBULATORY_CARE_PROVIDER_SITE_OTHER): Payer: Medicare Other | Admitting: Sports Medicine

## 2022-02-25 DIAGNOSIS — S0003XA Contusion of scalp, initial encounter: Secondary | ICD-10-CM

## 2022-02-25 DIAGNOSIS — M79645 Pain in left finger(s): Secondary | ICD-10-CM

## 2022-02-25 DIAGNOSIS — Q761 Klippel-Feil syndrome: Secondary | ICD-10-CM | POA: Diagnosis not present

## 2022-02-25 DIAGNOSIS — M19042 Primary osteoarthritis, left hand: Secondary | ICD-10-CM | POA: Diagnosis not present

## 2022-02-25 MED ORDER — OXYCODONE-ACETAMINOPHEN 5-325 MG PO TABS: 1.0000 | ORAL_TABLET | Freq: Three times a day (TID) | ORAL | 0 refills | Status: DC | PRN

## 2022-02-25 NOTE — Progress Notes (Signed)
    Procedures performed today:    None.  Independent interpretation of notes and tests performed by another provider:   None.  Brief History, Exam, Impression, and Recommendations:    Thumb pain, left This pleasant 67 year old female had a recent fall, she hit her head and was seen in the ED, CT scan showed a subgaleal hematoma but no skull fractures or intracranial bleeding. She also had x-rays of her pelvis which were negative for fracture. Principal complaint today is pain at the left first MCP, she has swelling, but good motion and good strength, I would like her to wear a thumb spica brace, we will do oxycodone for pain and x-rays. Return to see me in 3 weeks.  Scalp hematoma Seen in the ED, scalp hematoma noted, no intracranial bleeding. This will resolve on its own.    ____________________________________________ Gwen Her. Dianah Field, M.D., ABFM., CAQSM., AME. Primary Care and Sports Medicine [facility]  Adjunct Professor of [employer] [facility] [employer]  Risk manager

## 2022-02-25 NOTE — Assessment & Plan Note (Signed)
Seen in the ED, scalp hematoma noted, no intracranial bleeding. This will resolve on its own.

## 2022-02-25 NOTE — Assessment & Plan Note (Signed)
This pleasant 67 year old female had a recent fall, she hit her head and was seen in the ED, CT scan showed a subgaleal hematoma but no skull fractures or intracranial bleeding. She also had x-rays of her pelvis which were negative for fracture. Principal complaint today is pain at the left first MCP, she has swelling, but good motion and good strength, I would like her to wear a thumb spica brace, we will do oxycodone for pain and x-rays. Return to see me in 3 weeks.

## 2022-02-27 ENCOUNTER — Ambulatory Visit: Payer: Medicare Other | Admitting: Physical Therapy

## 2022-03-04 NOTE — Therapy (Unsigned)
OUTPATIENT PHYSICAL THERAPY CERVICAL EVALUATION   Patient Name: Sherri [name] MRN: [id number] DOB:03-Jan-1956, 67 y.o., female Today's Date: 03/05/2022  END OF SESSION:  PT End of Session - 03/05/22 0917     Visit Number 1    Number of Visits 16    Date for PT Re-Evaluation 04/30/22    Authorization Type [employer]    PT Start Time 0920    PT Stop Time 1005    PT Time Calculation (min) 45 min    Activity Tolerance Patient tolerated treatment well    Behavior During Therapy WFL for tasks assessed/performed             Past Medical History:  Diagnosis Date   Anxiety    Arthritis    Asthma    COPD (chronic obstructive pulmonary disease) ([place])    Depression    Pneumonia    Recurrent upper respiratory infection (URI)    Past Surgical History:  Procedure Laterality Date   ankle sx  1973   APPENDECTOMY     BREAST REDUCTION SURGERY  06/24/2011   Procedure: MAMMARY REDUCTION  (BREAST);  Surgeon: Cristine Polio, MD;  Location: [facility];  Service: Plastics;  Laterality: Bilateral;   CERVICAL DISC SURGERY     (? laminectomy?)   CESAREAN SECTION  1976   left groin hernia  1974   REDUCTION MAMMAPLASTY     TONSILLECTOMY     Patient Active Problem List   Diagnosis Date Noted   Scalp hematoma 02/25/2022   Thumb pain, left 02/25/2022   Senile purpura ([place]) 10/24/2021   Secondary osteoarthritis of right ankle 09/03/2021   Primary osteoarthritis of left hand 06/01/2021   Bilateral hand numbness 03/09/2021   Restrictive lung disease 11/02/2020   Benign paroxysmal positional vertigo, left 08/18/2020   Hyperlipidemia with target LDL less than 100 07/10/2020   Abnormal weight loss 07/10/2020   Pulmonary emphysema ([place]) 06/09/2020   Cervical fusion syndrome 09/29/2019   Cyst of finger of right hand 07/12/2019   Stress 06/28/2019   Essential hypertension 02/25/2019   Chronic diarrhea 01/21/2019   Chondromalacia of patella, left 03/09/2013   UNSPECIFIED VENOUS  INSUFFICIENCY 09/13/2009   Lumbar degenerative disc disease 09/13/2009   CHRONIC OBSTRUCTIVE PULMONARY DISEASE, ACUTE EXACERBATION 06/15/2009   FATIGUE 10/11/2008   ALLERGIC RHINITIS 03/21/2008   MDD (major depressive disorder) 10/22/2005   Panic attack 10/22/2005   TOBACCO DEPENDENCE 10/22/2005   OSTEOARTHRITIS, UNSPECIFIED 10/22/2005    PCP: Dr Beatrice Lecher   REFERRING PROVIDER: Dr Earnie Larsson  REFERRING DIAG: Cervical fusion syndrome; unspecified inflammatory spondylopathy cervical region   THERAPY DIAG:  Thoracic spine pain  Cervicalgia  Muscle weakness (generalized)  Abnormal posture  Other abnormalities of gait and mobility  Rationale for Evaluation and Treatment: Rehabilitation  ONSET DATE: "Couple of years"  SUBJECTIVE:  SUBJECTIVE STATEMENT: Pt reports Dr. Annette Stable wanted her to get PT for her spine. Pt had repair work on a bulging disc years ago but the disc below that has started to have issues. Wants to try more PT before getting any more surgery. Pt is noting numbing in both hands when laying down. Reports N/T going into midback. Pt has tried epidural and blocks.   PERTINENT HISTORY:  Prior C4-C6 ACDF  PAIN:  Are you having pain? Yes: NPRS scale: currently 3-4, at worst 7/10 Pain location: Most of pain around C7 to T4 Pain description: N/T, sharp at times Aggravating factors: Gets worse later in the day, every day activities  Relieving factors: "The least I do the better I am"  PRECAUTIONS: Fall  WEIGHT BEARING RESTRICTIONS: No  FALLS:  Has patient fallen in last 6 months? Yes. Number of falls 1 -- reports dizzy spells and did hit her head  LIVING ENVIRONMENT: Lives with: lives with their family and lives with their spouse and grandson Lives in:  House/apartment Stairs: Yes: External: 4 steps; on left going up Has following equipment at home: None  OCCUPATION: Retired  PLOF: Independent  PATIENT GOALS: Improve pain/NT for increased endurance with home tasks  NEXT MD VISIT: April 25, 2022  OBJECTIVE:   DIAGNOSTIC FINDINGS:  MRI - 04/28/21: 1. Prior ACDF at C4-C6 without residual stenosis. 2. Multilevel cervical spondylosis with resultant mild spinal stenosis at C3-4, C6-7, and C7-T1. 3. Multifactorial degenerative changes with resultant multilevel foraminal narrowing as above. Notable findings include moderate right worse than left C4 foraminal stenosis, moderate to severe left C7 foraminal narrowing, with severe right and moderate left C8 and T1 foraminal stenosis. 4. Tiny focus of chronic myelomalacia within the left hemi cord at the level of C5-6.  PATIENT SURVEYS:  FOTO 43; predicted 54  COGNITION: Overall cognitive status: Within functional limits for tasks assessed  SENSATION: Notes bilat UE N/T  POSTURE: Patient presents with head forward posture with increased thoracic kyphosis; shoulders rounded and elevated; scapulae abducted and rotated along the thoracic spine; head of the humerus anterior in orientation.    PALPATION: Mild TTP R suboccipital and bilat UT (pt notes she is not sure how much of that is from her recent fall) Most tender along cervicothoracic paraspinals and bilat periscapular muscles (L worse than R), hypomobile with gross PA mobs along T-spine with tenderness  CERVICAL ROM:   AROM A/PROM (deg) eval  Flexion 24  Extension 40  Right lateral flexion 35  Left lateral flexion 27  Right rotation 35  Left rotation 44   (Blank rows = not tested)  UPPER EXTREMITY ROM:  Active ROM Right eval Left eval  Shoulder flexion ~130 ~130  Shoulder extension    Shoulder abduction ~120 ~120  Shoulder adduction    Shoulder internal rotation To L3 To L3  Shoulder external rotation To T2 To T2  Elbow  flexion    Elbow extension    Wrist flexion    Wrist extension    Wrist ulnar deviation    Wrist radial deviation    Wrist pronation    Wrist supination     (Blank rows = not tested)  UPPER EXTREMITY MMT:  MMT Right eval Left eval  Shoulder flexion 4+ 4  Shoulder extension 3+ 3+  Shoulder abduction 4+ 4  Shoulder adduction    Shoulder internal rotation 5 5  Shoulder external rotation 3+ 3+  Middle trapezius 3- 2+  Lower trapezius 3- 2+  Elbow flexion  Elbow extension    Wrist flexion    Wrist extension    Wrist ulnar deviation    Wrist radial deviation    Wrist pronation    Wrist supination    Grip strength     (Blank rows = not tested)  [facility] Adult PT Treatment:                                                DATE: 03/05/22 Therapeutic Exercise: See HEP below   PATIENT EDUCATION:  Education details: POC; HEP  Person educated: Patient Education method: Explanation, Demonstration, Tactile cues, Verbal cues, and Handouts Education comprehension: verbalized understanding, returned demonstration, verbal cues required, tactile cues required, and needs further education  HOME EXERCISE PROGRAM: Access Code: [email or web address] URL: [email or web address] Date: 03/05/2022 Prepared by: Estill Bamberg April Thurnell Garbe  Exercises - Shoulder External Rotation and Scapular Retraction with Resistance  - 1 x daily - 7 x weekly - 2 sets - 10 reps - 3 sec hold - Shoulder External Rotation in 45 Degrees Abduction  - 1 x daily - 7 x weekly - 2 sets - 10 reps - 3 sec hold - Shoulder Horizontal Abduction - Thumbs Up  - 1 x daily - 7 x weekly - 2 sets - 10 reps - Standing Scapular Retraction in Abduction  - 1 x daily - 7 x weekly - 2 sets - 10 reps - Standing Cervical Retraction  - 1 x daily - 7 x weekly - 2 sets - 10 reps - 3 sec hold  ASSESSMENT:  CLINICAL IMPRESSION: Patient is a 67 y.o. female who was seen today for physical therapy evaluation and treatment for chronic cervical  dysfunction. Assessment significant for gross midback weakness, decreased cervical ROM with concurrent pain during movement, limited bilat shoulder ROM and weakness affecting ADLs and IADLs. Pt would benefit from PT to address these deficits for improved mobility. Goals of PT to trial PT prior to consideration of new surgery.   OBJECTIVE IMPAIRMENTS: decreased activity tolerance, decreased endurance, decreased mobility, decreased ROM, decreased strength, hypomobility, increased muscle spasms, impaired flexibility, impaired UE functional use, improper body mechanics, postural dysfunction, and pain.   ACTIVITY LIMITATIONS: carrying, lifting, bending, sitting, and sleeping  PARTICIPATION LIMITATIONS: meal prep, cleaning, laundry, driving, shopping, and community activity  PERSONAL FACTORS: Age, Fitness, Past/current experiences, Time since onset of injury/illness/exacerbation, and comorbidities including: are also affecting patient's functional outcome.   REHAB POTENTIAL: Good  CLINICAL DECISION MAKING: Stable/uncomplicated  EVALUATION COMPLEXITY: Low   GOALS: Goals reviewed with patient? Yes  SHORT TERM GOALS: Target date: 04/02/2022   Pt will be ind with initial HEP Baseline:  Goal status: INITIAL  2.  Pt will be able to perform all prone mid back exercises against gravity Baseline:  Goal status: INITIAL    LONG TERM GOALS: Target date: 04/30/2022   Pt will be ind with progression and maintenance of HEP Baseline:  Goal status: INITIAL  2.  Pt will report reduced pain by >/=50% Baseline:  Goal status: INITIAL  3.  Pt will improve shoulder elevation to >/=150 deg to demo improving scapular stability for lifting and overhead tasks Baseline:  Goal status: INITIAL  4.  Pt will be able to lift and carry at least 15# for ADLs and IADLs such as groceries Baseline:  Goal status: INITIAL  5.  Pt will have improved FOTO  score to >/=53 Baseline:  Goal status: INITIAL  6.  Pt  will note reduction in midback N/T by >/=50% Baseline:  Goal status: INITIAL   PLAN:  PT FREQUENCY: 2x/week  PT DURATION: 8 weeks  PLANNED INTERVENTIONS: Therapeutic exercises, Therapeutic activity, Neuromuscular re-education, Balance training, Gait training, Patient/Family education, Self Care, Joint mobilization, Aquatic Therapy, Dry Needling, Electrical stimulation, Spinal mobilization, Cryotherapy, Moist heat, Taping, Ultrasound, Ionotophoresis 48m/ml Dexamethasone, Manual therapy, and Re-evaluation  PLAN FOR NEXT SESSION: review and progress exercises; continue with postural correction and education; manual work, DN, modalities as indicated   Gellen April Ma L Nonato, PT 03/05/2022, 10:11 AM

## 2022-03-05 ENCOUNTER — Ambulatory Visit: Payer: Medicare Other | Attending: Neurosurgery | Admitting: Physical Therapy

## 2022-03-05 ENCOUNTER — Other Ambulatory Visit: Payer: Self-pay

## 2022-03-05 ENCOUNTER — Encounter: Payer: Self-pay | Admitting: Physical Therapy

## 2022-03-05 DIAGNOSIS — R293 Abnormal posture: Secondary | ICD-10-CM | POA: Insufficient documentation

## 2022-03-05 DIAGNOSIS — R2689 Other abnormalities of gait and mobility: Secondary | ICD-10-CM | POA: Diagnosis not present

## 2022-03-05 DIAGNOSIS — M542 Cervicalgia: Secondary | ICD-10-CM | POA: Diagnosis not present

## 2022-03-05 DIAGNOSIS — M6281 Muscle weakness (generalized): Secondary | ICD-10-CM | POA: Diagnosis not present

## 2022-03-05 DIAGNOSIS — M546 Pain in thoracic spine: Secondary | ICD-10-CM | POA: Diagnosis not present

## 2022-03-11 ENCOUNTER — Telehealth: Payer: Self-pay | Admitting: Family Medicine

## 2022-03-11 NOTE — Telephone Encounter (Signed)
Called patient to schedule Medicare Annual Wellness Visit (AWV). Left message for patient to call back and schedule Medicare Annual Wellness Visit (AWV).  Last date of AWV: Never  Please schedule an appointment at any time with Nurse Health Advisor.  If any questions, please contact me at [phone].  Thank you ,  Lin Givens Patient Access Advocate II Direct Dial: [phone]

## 2022-03-12 ENCOUNTER — Ambulatory Visit: Payer: Medicare Other | Admitting: Physical Therapy

## 2022-03-12 ENCOUNTER — Encounter: Payer: Self-pay | Admitting: Physical Therapy

## 2022-03-12 DIAGNOSIS — R293 Abnormal posture: Secondary | ICD-10-CM

## 2022-03-12 DIAGNOSIS — M546 Pain in thoracic spine: Secondary | ICD-10-CM

## 2022-03-12 DIAGNOSIS — M6281 Muscle weakness (generalized): Secondary | ICD-10-CM

## 2022-03-12 DIAGNOSIS — R2689 Other abnormalities of gait and mobility: Secondary | ICD-10-CM | POA: Diagnosis not present

## 2022-03-12 DIAGNOSIS — M542 Cervicalgia: Secondary | ICD-10-CM | POA: Diagnosis not present

## 2022-03-12 NOTE — Therapy (Signed)
OUTPATIENT PHYSICAL THERAPY TREATMENT   Patient Name: Sherri [name] MRN: [id number] DOB:11-03-1955, 67 y.o., female Today's Date: 03/12/2022  END OF SESSION:  PT End of Session - 03/12/22 0925     Visit Number 2    Number of Visits 16    Date for PT Re-Evaluation 04/30/22    Authorization Type UHC    PT Start Time 0930    PT Stop Time 1010    PT Time Calculation (min) 40 min    Activity Tolerance Patient tolerated treatment well    Behavior During Therapy WFL for tasks assessed/performed              Past Medical History:  Diagnosis Date   Anxiety    Arthritis    Asthma    COPD (chronic obstructive pulmonary disease) ([place])    Depression    Pneumonia    Recurrent upper respiratory infection (URI)    Past Surgical History:  Procedure Laterality Date   ankle sx  1973   APPENDECTOMY     BREAST REDUCTION SURGERY  06/24/2011   Procedure: MAMMARY REDUCTION  (BREAST);  Surgeon: Cristine Polio, MD;  Location: [place];  Service: Plastics;  Laterality: Bilateral;   CERVICAL DISC SURGERY     (? laminectomy?)   CESAREAN SECTION  1976   left groin hernia  1974   REDUCTION MAMMAPLASTY     TONSILLECTOMY     Patient Active Problem List   Diagnosis Date Noted   Scalp hematoma 02/25/2022   Thumb pain, left 02/25/2022   Senile purpura ([place]) 10/24/2021   Secondary osteoarthritis of right ankle 09/03/2021   Primary osteoarthritis of left hand 06/01/2021   Bilateral hand numbness 03/09/2021   Restrictive lung disease 11/02/2020   Benign paroxysmal positional vertigo, left 08/18/2020   Hyperlipidemia with target LDL less than 100 07/10/2020   Abnormal weight loss 07/10/2020   Pulmonary emphysema ([place]) 06/09/2020   Cervical fusion syndrome 09/29/2019   Cyst of finger of right hand 07/12/2019   Stress 06/28/2019   Essential hypertension 02/25/2019   Chronic diarrhea 01/21/2019   Chondromalacia of patella, left 03/09/2013   UNSPECIFIED VENOUS  INSUFFICIENCY 09/13/2009   Lumbar degenerative disc disease 09/13/2009   CHRONIC OBSTRUCTIVE PULMONARY DISEASE, ACUTE EXACERBATION 06/15/2009   FATIGUE 10/11/2008   ALLERGIC RHINITIS 03/21/2008   MDD (major depressive disorder) 10/22/2005   Panic attack 10/22/2005   TOBACCO DEPENDENCE 10/22/2005   OSTEOARTHRITIS, UNSPECIFIED 10/22/2005    PCP: Dr Beatrice Lecher   REFERRING PROVIDER: Dr Earnie Larsson  REFERRING DIAG: Cervical fusion syndrome; unspecified inflammatory spondylopathy cervical region   THERAPY DIAG:  Thoracic spine pain  Cervicalgia  Muscle weakness (generalized)  Abnormal posture  Rationale for Evaluation and Treatment: Rehabilitation  ONSET DATE: "Couple of years"  SUBJECTIVE:  SUBJECTIVE STATEMENT: Pt reports she's been getting dizzy since the fall. Pt will follow up on the 5th with her doctor. Pt reports she's been trying to do the exercises at least every day. Pt reports the exercises do make her a little sore.   PERTINENT HISTORY:  Prior C4-C6 ACDF  PAIN:  Are you having pain? Yes: NPRS scale: currently 3 or 4/10 Pain location: Most of pain around C7 to T4 Pain description: Stiffness Aggravating factors: Gets worse later in the day, every day activities  Relieving factors: "The least I do the better I am"  PRECAUTIONS: Fall  WEIGHT BEARING RESTRICTIONS: No  FALLS:  Has patient fallen in last 6 months? Yes. Number of falls 1 -- reports dizzy spells and did hit her head  LIVING ENVIRONMENT: Lives with: lives with their family and lives with their spouse and grandson Lives in: House/apartment Stairs: Yes: External: 4 steps; on left going up Has following equipment at home: None  OCCUPATION: Retired  PLOF: Independent  PATIENT GOALS: Improve  pain/NT for increased endurance with home tasks  NEXT MD VISIT: April 25, 2022  OBJECTIVE:   DIAGNOSTIC FINDINGS:  MRI - 04/28/21: 1. Prior ACDF at C4-C6 without residual stenosis. 2. Multilevel cervical spondylosis with resultant mild spinal stenosis at C3-4, C6-7, and C7-T1. 3. Multifactorial degenerative changes with resultant multilevel foraminal narrowing as above. Notable findings include moderate right worse than left C4 foraminal stenosis, moderate to severe left C7 foraminal narrowing, with severe right and moderate left C8 and T1 foraminal stenosis. 4. Tiny focus of chronic myelomalacia within the left hemi cord at the level of C5-6.  PATIENT SURVEYS:  FOTO 20; predicted 95  COGNITION: Overall cognitive status: Within functional limits for tasks assessed  SENSATION: Notes bilat UE N/T  POSTURE: Patient presents with head forward posture with increased thoracic kyphosis; shoulders rounded and elevated; scapulae abducted and rotated along the thoracic spine; head of the humerus anterior in orientation.    PALPATION: Mild TTP R suboccipital and bilat UT (pt notes she is not sure how much of that is from her recent fall) Most tender along cervicothoracic paraspinals and bilat periscapular muscles (L worse than R), hypomobile with gross PA mobs along T-spine with tenderness  CERVICAL ROM:   AROM A/PROM (deg) eval  Flexion 24  Extension 40  Right lateral flexion 35  Left lateral flexion 27  Right rotation 35  Left rotation 44   (Blank rows = not tested)  UPPER EXTREMITY ROM:  Active ROM Right eval Left eval  Shoulder flexion ~130 ~130  Shoulder extension    Shoulder abduction ~120 ~120  Shoulder adduction    Shoulder internal rotation To L3 To L3  Shoulder external rotation To T2 To T2  Elbow flexion    Elbow extension    Wrist flexion    Wrist extension    Wrist ulnar deviation    Wrist radial deviation    Wrist pronation    Wrist supination     (Blank rows  = not tested)  UPPER EXTREMITY MMT:  MMT Right eval Left eval  Shoulder flexion 4+ 4  Shoulder extension 3+ 3+  Shoulder abduction 4+ 4  Shoulder adduction    Shoulder internal rotation 5 5  Shoulder external rotation 3+ 3+  Middle trapezius 3- 2+  Lower trapezius 3- 2+  Elbow flexion    Elbow extension    Wrist flexion    Wrist extension    Wrist ulnar deviation    Wrist  radial deviation    Wrist pronation    Wrist supination    Grip strength     (Blank rows = not tested)   [facility] Adult PT Treatment:                                                DATE: 03/12/22 Therapeutic Exercise: UBE L2, 4 min fwd/bwd Supine Chin tuck 10x 3 sec Shoulder flexion AAROM x10 with cane Shoulder ER AAROM x10 with cane Shoulder abd AAROM x10 with cane Sitting Shoulder ER red TB 2x10 W x10, red TB x10 Shoulder horizontal abd 2x10 focusing on scapular retraction Manual Therapy: STM, MFR & TPR suboccipitals, cervical paraspinals, posterior scalenes, thoracic paraspinals K tape to provide scapular stability to encourage posterior tilt: 2 "I" strips. 1 strip from acromion following lateral border of scapula. 1 strip following coracoid process and pulling medial border of scapula into posterior tilt   [facility] Adult PT Treatment:                                                DATE: 03/05/22 Therapeutic Exercise: See HEP below   PATIENT EDUCATION:  Education details: POC; HEP  Person educated: Patient Education method: Explanation, Demonstration, Tactile cues, Verbal cues, and Handouts Education comprehension: verbalized understanding, returned demonstration, verbal cues required, tactile cues required, and needs further education  HOME EXERCISE PROGRAM: Access Code: [email or web address] URL: [email or web address] Date: 03/05/2022 Prepared by: Estill Bamberg April Thurnell Garbe  Exercises - Shoulder External Rotation and Scapular Retraction with Resistance  - 1 x daily - 7 x weekly - 2 sets - 10  reps - 3 sec hold - Shoulder External Rotation in 45 Degrees Abduction  - 1 x daily - 7 x weekly - 2 sets - 10 reps - 3 sec hold - Shoulder Horizontal Abduction - Thumbs Up  - 1 x daily - 7 x weekly - 2 sets - 10 reps - Standing Scapular Retraction in Abduction  - 1 x daily - 7 x weekly - 2 sets - 10 reps - Standing Cervical Retraction  - 1 x daily - 7 x weekly - 2 sets - 10 reps - 3 sec hold  ASSESSMENT:  CLINICAL IMPRESSION: Treatment focused on gentle manual work through pt's neck muscular tightness and trigger points. Initiated shoulder AAROM to get stretch/movement in [facility] joint. Reviewed HEP and updated accordingly. Pt tolerated well with no increase in pain. Trial of k-tape provided today for improved postural stability.   OBJECTIVE IMPAIRMENTS: decreased activity tolerance, decreased endurance, decreased mobility, decreased ROM, decreased strength, hypomobility, increased muscle spasms, impaired flexibility, impaired UE functional use, improper body mechanics, postural dysfunction, and pain.    GOALS: Goals reviewed with patient? Yes  SHORT TERM GOALS: Target date: 04/02/2022   Pt will be ind with initial HEP Baseline:  Goal status: INITIAL  2.  Pt will be able to perform all prone mid back exercises against gravity Baseline:  Goal status: INITIAL    LONG TERM GOALS: Target date: 04/30/2022   Pt will be ind with progression and maintenance of HEP Baseline:  Goal status: INITIAL  2.  Pt will report reduced pain by >/=50% Baseline:  Goal status: INITIAL  3.  Pt will improve  shoulder elevation to >/=150 deg to demo improving scapular stability for lifting and overhead tasks Baseline:  Goal status: INITIAL  4.  Pt will be able to lift and carry at least 15# for ADLs and IADLs such as groceries Baseline:  Goal status: INITIAL  5.  Pt will have improved FOTO score to >/=53 Baseline:  Goal status: INITIAL  6.  Pt will note reduction in midback N/T by  >/=50% Baseline:  Goal status: INITIAL   PLAN:  PT FREQUENCY: 2x/week  PT DURATION: 8 weeks  PLANNED INTERVENTIONS: Therapeutic exercises, Therapeutic activity, Neuromuscular re-education, Balance training, Gait training, Patient/Family education, Self Care, Joint mobilization, Aquatic Therapy, Dry Needling, Electrical stimulation, Spinal mobilization, Cryotherapy, Moist heat, Taping, Ultrasound, Ionotophoresis '4mg'$ /ml Dexamethasone, Manual therapy, and Re-evaluation  PLAN FOR NEXT SESSION: review and progress exercises; continue with postural correction and education; manual work, DN, modalities as indicated. Add rows and standing scapular stabilization exercises.   Windy Dudek April Ma L Schwanda Zima, PT 03/12/2022, 9:25 AM

## 2022-03-14 ENCOUNTER — Encounter: Payer: Self-pay | Admitting: Physical Therapy

## 2022-03-14 ENCOUNTER — Ambulatory Visit: Payer: Medicare Other | Admitting: Physical Therapy

## 2022-03-14 DIAGNOSIS — R293 Abnormal posture: Secondary | ICD-10-CM

## 2022-03-14 DIAGNOSIS — M542 Cervicalgia: Secondary | ICD-10-CM | POA: Diagnosis not present

## 2022-03-14 DIAGNOSIS — M546 Pain in thoracic spine: Secondary | ICD-10-CM

## 2022-03-14 DIAGNOSIS — R2689 Other abnormalities of gait and mobility: Secondary | ICD-10-CM | POA: Diagnosis not present

## 2022-03-14 DIAGNOSIS — M6281 Muscle weakness (generalized): Secondary | ICD-10-CM

## 2022-03-14 NOTE — Therapy (Signed)
OUTPATIENT PHYSICAL THERAPY TREATMENT   Patient Name: Sherri [name] MRN: [id number] DOB:October 19, 1955, 67 y.o., female Today's Date: 03/14/2022  END OF SESSION:  PT End of Session - 03/14/22 0932     Visit Number 3    Number of Visits 16    Date for PT Re-Evaluation 04/30/22    Authorization Type UHC    PT Start Time 0932    PT Stop Time 1015    PT Time Calculation (min) 43 min    Activity Tolerance Patient tolerated treatment well    Behavior During Therapy WFL for tasks assessed/performed               Past Medical History:  Diagnosis Date   Anxiety    Arthritis    Asthma    COPD (chronic obstructive pulmonary disease) ([place])    Depression    Pneumonia    Recurrent upper respiratory infection (URI)    Past Surgical History:  Procedure Laterality Date   ankle sx  1973   APPENDECTOMY     BREAST REDUCTION SURGERY  06/24/2011   Procedure: MAMMARY REDUCTION  (BREAST);  Surgeon: Cristine Polio, MD;  Location: [place];  Service: Plastics;  Laterality: Bilateral;   CERVICAL DISC SURGERY     (? laminectomy?)   CESAREAN SECTION  1976   left groin hernia  1974   REDUCTION MAMMAPLASTY     TONSILLECTOMY     Patient Active Problem List   Diagnosis Date Noted   Scalp hematoma 02/25/2022   Thumb pain, left 02/25/2022   Senile purpura ([place]) 10/24/2021   Secondary osteoarthritis of right ankle 09/03/2021   Primary osteoarthritis of left hand 06/01/2021   Bilateral hand numbness 03/09/2021   Restrictive lung disease 11/02/2020   Benign paroxysmal positional vertigo, left 08/18/2020   Hyperlipidemia with target LDL less than 100 07/10/2020   Abnormal weight loss 07/10/2020   Pulmonary emphysema ([place]) 06/09/2020   Cervical fusion syndrome 09/29/2019   Cyst of finger of right hand 07/12/2019   Stress 06/28/2019   Essential hypertension 02/25/2019   Chronic diarrhea 01/21/2019   Chondromalacia of patella, left 03/09/2013   UNSPECIFIED VENOUS  INSUFFICIENCY 09/13/2009   Lumbar degenerative disc disease 09/13/2009   CHRONIC OBSTRUCTIVE PULMONARY DISEASE, ACUTE EXACERBATION 06/15/2009   FATIGUE 10/11/2008   ALLERGIC RHINITIS 03/21/2008   MDD (major depressive disorder) 10/22/2005   Panic attack 10/22/2005   TOBACCO DEPENDENCE 10/22/2005   OSTEOARTHRITIS, UNSPECIFIED 10/22/2005    PCP: Dr Beatrice Lecher   REFERRING PROVIDER: Dr Earnie Larsson  REFERRING DIAG: Cervical fusion syndrome; unspecified inflammatory spondylopathy cervical region   THERAPY DIAG:  Thoracic spine pain  Cervicalgia  Muscle weakness (generalized)  Abnormal posture  Other abnormalities of gait and mobility  Rationale for Evaluation and Treatment: Rehabilitation  ONSET DATE: "Couple of years"  SUBJECTIVE:  SUBJECTIVE STATEMENT: Pt has been compliant with her exercises. States she feels more soreness than pain. Notes some continued tightness in her UT. Pt reports continued dizziness.  PERTINENT HISTORY:  Prior C4-C6 ACDF  PAIN:  Are you having pain? Yes: NPRS scale: currently 0/10 Pain location: Most of pain around C7 to T4 Pain description: Soreness Aggravating factors: Gets worse later in the day, every day activities  Relieving factors: "The least I do the better I am"  PRECAUTIONS: Fall  WEIGHT BEARING RESTRICTIONS: No  FALLS:  Has patient fallen in last 6 months? Yes. Number of falls 1 -- reports dizzy spells and did hit her head  LIVING ENVIRONMENT: Lives with: lives with their family and lives with their spouse and grandson Lives in: House/apartment Stairs: Yes: External: 4 steps; on left going up Has following equipment at home: None  OCCUPATION: Retired  PLOF: Independent  PATIENT GOALS: Improve pain/NT for increased  endurance with home tasks  NEXT MD VISIT: April 25, 2022  OBJECTIVE:   DIAGNOSTIC FINDINGS:  MRI - 04/28/21: 1. Prior ACDF at C4-C6 without residual stenosis. 2. Multilevel cervical spondylosis with resultant mild spinal stenosis at C3-4, C6-7, and C7-T1. 3. Multifactorial degenerative changes with resultant multilevel foraminal narrowing as above. Notable findings include moderate right worse than left C4 foraminal stenosis, moderate to severe left C7 foraminal narrowing, with severe right and moderate left C8 and T1 foraminal stenosis. 4. Tiny focus of chronic myelomalacia within the left hemi cord at the level of C5-6.  PATIENT SURVEYS:  FOTO 27; predicted 46  COGNITION: Overall cognitive status: Within functional limits for tasks assessed  SENSATION: Notes bilat UE N/T  POSTURE: Patient presents with head forward posture with increased thoracic kyphosis; shoulders rounded and elevated; scapulae abducted and rotated along the thoracic spine; head of the humerus anterior in orientation.    PALPATION: Mild TTP R suboccipital and bilat UT (pt notes she is not sure how much of that is from her recent fall) Most tender along cervicothoracic paraspinals and bilat periscapular muscles (L worse than R), hypomobile with gross PA mobs along T-spine with tenderness  CERVICAL ROM:   AROM A/PROM (deg) eval  Flexion 24  Extension 40  Right lateral flexion 35  Left lateral flexion 27  Right rotation 35  Left rotation 44   (Blank rows = not tested)  UPPER EXTREMITY ROM:  Active ROM Right eval Left eval  Shoulder flexion ~130 ~130  Shoulder extension    Shoulder abduction ~120 ~120  Shoulder adduction    Shoulder internal rotation To L3 To L3  Shoulder external rotation To T2 To T2  Elbow flexion    Elbow extension    Wrist flexion    Wrist extension    Wrist ulnar deviation    Wrist radial deviation    Wrist pronation    Wrist supination     (Blank rows = not  tested)  UPPER EXTREMITY MMT:  MMT Right eval Left eval  Shoulder flexion 4+ 4  Shoulder extension 3+ 3+  Shoulder abduction 4+ 4  Shoulder adduction    Shoulder internal rotation 5 5  Shoulder external rotation 3+ 3+  Middle trapezius 3- 2+  Lower trapezius 3- 2+  Elbow flexion    Elbow extension    Wrist flexion    Wrist extension    Wrist ulnar deviation    Wrist radial deviation    Wrist pronation    Wrist supination    Grip strength     (  Blank rows = not tested)  VESTIBULAR ASSESSMENT:  03/14/22: Dix-hallpike L: (+) L up beating nystagmus for ~20 sec; no symptoms on 2nd check Dix-hallpike R: No nystagmus    [facility] Adult PT Treatment:                                                DATE: 03/14/22 Therapeutic Exercise: UBE L2, 4 min fwd/bwd Supine Shoulder flexion AROM x10 Shoulder ER red TB 2x10 Shoulder ext red TB 2x10 Shoulder abd AROM x10 Standing Row green TB 2x10 Bow and arrow red TB x10 (mild LOB when stepping back on L) Canalith Repositioning: L Epley maneuver x 2 Self Care: How to perform self Epley maneuver at home  [facility] Adult PT Treatment:                                                DATE: 03/12/22 Therapeutic Exercise: UBE L2, 4 min fwd/bwd Supine Chin tuck 10x 3 sec Shoulder flexion AAROM x10 with cane Shoulder ER AAROM x10 with cane Shoulder abd AAROM x10 with cane Sitting Shoulder ER red TB 2x10 W x10, red TB x10 Shoulder horizontal abd 2x10 focusing on scapular retraction Manual Therapy: STM, MFR & TPR suboccipitals, cervical paraspinals, posterior scalenes, thoracic paraspinals K tape to provide scapular stability to encourage posterior tilt: 2 "I" strips. 1 strip from acromion following lateral border of scapula. 1 strip following coracoid process and pulling medial border of scapula into posterior tilt   [facility] Adult PT Treatment:                                                DATE: 03/05/22 Therapeutic Exercise: See HEP  below   PATIENT EDUCATION:  Education details: POC; HEP  Person educated: Patient Education method: Explanation, Demonstration, Tactile cues, Verbal cues, and Handouts Education comprehension: verbalized understanding, returned demonstration, verbal cues required, tactile cues required, and needs further education  HOME EXERCISE PROGRAM: Access Code: [email or web address] URL: [email or web address] Date: 03/05/2022 Prepared by: Estill Bamberg April Thurnell Garbe  Exercises - Shoulder External Rotation and Scapular Retraction with Resistance  - 1 x daily - 7 x weekly - 2 sets - 10 reps - 3 sec hold - Shoulder External Rotation in 45 Degrees Abduction  - 1 x daily - 7 x weekly - 2 sets - 10 reps - 3 sec hold - Shoulder Horizontal Abduction - Thumbs Up  - 1 x daily - 7 x weekly - 2 sets - 10 reps - Standing Scapular Retraction in Abduction  - 1 x daily - 7 x weekly - 2 sets - 10 reps - Standing Cervical Retraction  - 1 x daily - 7 x weekly - 2 sets - 10 reps - 3 sec hold  ASSESSMENT:  CLINICAL IMPRESSION: Pt with continued dizziness -- screened pt for BPPV. Found her to be (+) for L canalithiasis. Updated goals and POC accordingly to address her dizziness. Able to perform full shoulder ROM in supine without pain. Pt is demonstrating decreased neck muscle tension and improving scapular strength and stability.   OBJECTIVE IMPAIRMENTS: decreased activity tolerance,  decreased endurance, decreased mobility, decreased ROM, decreased strength, hypomobility, increased muscle spasms, impaired flexibility, impaired UE functional use, improper body mechanics, postural dysfunction, and pain.    GOALS: Goals reviewed with patient? Yes  SHORT TERM GOALS: Target date: 04/02/2022   Pt will be ind with initial HEP Baseline:  Goal status: INITIAL  2.  Pt will be able to perform all prone mid back exercises against gravity Baseline:  Goal status: INITIAL    LONG TERM GOALS: Target date:  04/30/2022   Pt will be ind with progression and maintenance of HEP Baseline:  Goal status: INITIAL  2.  Pt will report reduced pain by >/=50% Baseline:  Goal status: INITIAL  3.  Pt will improve shoulder elevation to >/=150 deg to demo improving scapular stability for lifting and overhead tasks Baseline:  Goal status: INITIAL  4.  Pt will be able to lift and carry at least 15# for ADLs and IADLs such as groceries Baseline:  Goal status: INITIAL  5.  Pt will have improved FOTO score to >/=53 Baseline:  Goal status: INITIAL  6.  Pt will note reduction in midback N/T by >/=50% Baseline:  Goal status: INITIAL  7.  Pt will report no s/s of dizziness with all activities Baseline:  Goal status: INITIAL    PLAN:  PT FREQUENCY: 2x/week  PT DURATION: 8 weeks  PLANNED INTERVENTIONS: Therapeutic exercises, Therapeutic activity, Neuromuscular re-education, Balance training, Gait training, Patient/Family education, Self Care, Joint mobilization, Aquatic Therapy, Dry Needling, Electrical stimulation, Spinal mobilization, Cryotherapy, Moist heat, Taping, Ultrasound, Ionotophoresis '4mg'$ /ml Dexamethasone, Manual therapy, canalith repositioning, vestibular rehab and Re-evaluation  PLAN FOR NEXT SESSION: review and progress exercises; continue with postural correction and education; manual work, DN, modalities as indicated.   Dimond Crotty April Ma L Rayman Petrosian, PT 03/14/2022, 9:33 AM

## 2022-03-19 ENCOUNTER — Ambulatory Visit: Payer: Medicare Other | Attending: Neurosurgery

## 2022-03-19 ENCOUNTER — Encounter: Payer: Self-pay | Admitting: Family Medicine

## 2022-03-19 ENCOUNTER — Ambulatory Visit (INDEPENDENT_AMBULATORY_CARE_PROVIDER_SITE_OTHER): Payer: Medicare Other | Admitting: Family Medicine

## 2022-03-19 VITALS — BP 133/70 | HR 65 | Ht 65.0 in | Wt 150.3 lb

## 2022-03-19 DIAGNOSIS — M5442 Lumbago with sciatica, left side: Secondary | ICD-10-CM | POA: Diagnosis not present

## 2022-03-19 DIAGNOSIS — G44309 Post-traumatic headache, unspecified, not intractable: Secondary | ICD-10-CM | POA: Diagnosis not present

## 2022-03-19 DIAGNOSIS — R2689 Other abnormalities of gait and mobility: Secondary | ICD-10-CM | POA: Diagnosis not present

## 2022-03-19 DIAGNOSIS — R293 Abnormal posture: Secondary | ICD-10-CM | POA: Insufficient documentation

## 2022-03-19 DIAGNOSIS — J441 Chronic obstructive pulmonary disease with (acute) exacerbation: Secondary | ICD-10-CM

## 2022-03-19 DIAGNOSIS — H8110 Benign paroxysmal vertigo, unspecified ear: Secondary | ICD-10-CM | POA: Diagnosis not present

## 2022-03-19 DIAGNOSIS — R29898 Other symptoms and signs involving the musculoskeletal system: Secondary | ICD-10-CM | POA: Insufficient documentation

## 2022-03-19 DIAGNOSIS — Q761 Klippel-Feil syndrome: Secondary | ICD-10-CM | POA: Diagnosis not present

## 2022-03-19 DIAGNOSIS — M546 Pain in thoracic spine: Secondary | ICD-10-CM | POA: Insufficient documentation

## 2022-03-19 DIAGNOSIS — M79645 Pain in left finger(s): Secondary | ICD-10-CM

## 2022-03-19 DIAGNOSIS — J439 Emphysema, unspecified: Secondary | ICD-10-CM

## 2022-03-19 DIAGNOSIS — R42 Dizziness and giddiness: Secondary | ICD-10-CM | POA: Insufficient documentation

## 2022-03-19 DIAGNOSIS — M6281 Muscle weakness (generalized): Secondary | ICD-10-CM | POA: Insufficient documentation

## 2022-03-19 DIAGNOSIS — M539 Dorsopathy, unspecified: Secondary | ICD-10-CM | POA: Diagnosis not present

## 2022-03-19 DIAGNOSIS — M542 Cervicalgia: Secondary | ICD-10-CM | POA: Insufficient documentation

## 2022-03-19 MED ORDER — UMECLIDINIUM-VILANTEROL 62.5-25 MCG/ACT IN AEPB: 1.0000 | INHALATION_SPRAY | Freq: Every day | RESPIRATORY_TRACT | 6 refills | Status: DC

## 2022-03-19 MED ORDER — ALBUTEROL SULFATE HFA 108 (90 BASE) MCG/ACT IN AERS: INHALATION_SPRAY | RESPIRATORY_TRACT | 6 refills | Status: DC

## 2022-03-19 MED ORDER — OXYCODONE-ACETAMINOPHEN 5-325 MG PO TABS: 1.0000 | ORAL_TABLET | Freq: Three times a day (TID) | ORAL | 0 refills | Status: DC | PRN

## 2022-03-19 NOTE — Patient Instructions (Signed)
Follow back up with Dr. Darene Lamer in about 3 weeks if you are not continuing to improve with physical therapy.

## 2022-03-19 NOTE — Progress Notes (Signed)
Acute Office Visit  Subjective:     Patient ID: Sherri [name], female    DOB: 08-03-1955, 67 y.o.   MRN: [id number]  Chief Complaint  Patient presents with   Follow-up    Fall February 12,2024    Fall    HPI  Patient is in today for fell on 2/12 and hit her head.  She was seen in the emergency department on February 6 for the injury head CT just showed a subgaleal hematoma but no skull fracture or bleeding.  Also having some pain in the left first MCP with some swelling.  Placed her in a thumb spica brace and did do x-rays.  3 was negative for any acute fracture just some degenerative changes at the base of the thumb.  She has been going to physical therapy as well.  Since the head injury she is also felt like she has had some room spinning.  She also has had a session of vestibular rehab but feels like it might of made the vertigo a little bit worse.  In regards to the thumb she is still having some pain and discomfort at the joint is very tender to touch.  X-ray was negative for fracture which is very reassuring.  She has been wearing the splint regularly.  He has been having some more headaches since the head injury as well.   Needs refills on inahler.    ROS      Objective:    BP 133/70 (BP Location: Left Arm, Patient Position: Sitting, Cuff Size: Normal)   Pulse 65   Ht '5\' 5"'$  (1.651 m)   Wt 150 lb 4.8 oz (68.2 kg)   SpO2 96%   BMI 25.01 kg/m    Physical Exam Constitutional:      Appearance: She is well-developed.  HENT:     Head: Normocephalic and atraumatic.     Right Ear: Tympanic membrane, ear canal and external ear normal.     Left Ear: Tympanic membrane, ear canal and external ear normal.     Nose: Nose normal.     Mouth/Throat:     Pharynx: Oropharynx is clear.  Eyes:     Conjunctiva/sclera: Conjunctivae normal.     Pupils: Pupils are equal, round, and reactive to light.  Neck:     Thyroid: No thyromegaly.  Cardiovascular:     Rate and Rhythm:  Normal rate and regular rhythm.     Heart sounds: Normal heart sounds.  Pulmonary:     Effort: Pulmonary effort is normal.     Breath sounds: Normal breath sounds. No wheezing.  Musculoskeletal:     Cervical back: Neck supple.  Lymphadenopathy:     Cervical: No cervical adenopathy.  Skin:    General: Skin is warm and dry.  Neurological:     Mental Status: She is alert and oriented to person, place, and time.     No results found for any visits on 03/19/22.      Assessment & Plan:   Problem List Items Addressed This Visit       Respiratory   Pulmonary emphysema ([place])   Relevant Medications   albuterol (VENTOLIN HFA) 108 (90 Base) MCG/ACT inhaler   umeclidinium-vilanterol (ANORO ELLIPTA) 62.5-25 MCG/ACT AEPB   CHRONIC OBSTRUCTIVE PULMONARY DISEASE, ACUTE EXACERBATION    Need refills on her inhalers.  No recent flare or exacerbation.  She does still smoke.      Relevant Medications   albuterol (VENTOLIN HFA) 108 (90 Base)  MCG/ACT inhaler   umeclidinium-vilanterol (ANORO ELLIPTA) 62.5-25 MCG/ACT AEPB     Musculoskeletal and Integument   Cervical fusion syndrome   Relevant Medications   oxyCODONE-acetaminophen (PERCOCET/ROXICET) 5-325 MG tablet     Other   Thumb pain, left   Relevant Medications   oxyCODONE-acetaminophen (PERCOCET/ROXICET) 5-325 MG tablet   Other Visit Diagnoses     Benign paroxysmal positional vertigo, unspecified laterality    -  Primary   Post-concussion headache       Relevant Medications   oxyCODONE-acetaminophen (PERCOCET/ROXICET) 5-325 MG tablet       BPPV-encouraged her to continue with vestibular rehab.  It should hopefully improve in the next couple of weeks with treatments.  If not let me know.  We did just check her ear today to make sure there was no sign of fluid etc.  Thumb pain, left -I did refill the oxycodone today for 20 tabs to use as needed sparingly.  I did let her know we would refill this 1 more time today.  Continue to  wear the splint for 3 more weeks and continue with physical therapy.  If not feeling much better at that point in time then recommend getting back in with Dr. Dianah Field our sports med doc.  Meds ordered this encounter  Medications   albuterol (VENTOLIN HFA) 108 (90 Base) MCG/ACT inhaler    Sig: TAKE 2 PUFFS BY MOUTH EVERY 6 HOURS AS NEEDED FOR WHEEZE OR SHORTNESS OF BREATH    Dispense:  8.5 each    Refill:  6   umeclidinium-vilanterol (ANORO ELLIPTA) 62.5-25 MCG/ACT AEPB    Sig: Inhale 1 puff into the lungs daily at 6 (six) AM.    Dispense:  60 each    Refill:  6   oxyCODONE-acetaminophen (PERCOCET/ROXICET) 5-325 MG tablet    Sig: Take 1 tablet by mouth every 8 (eight) hours as needed.    Dispense:  20 tablet    Refill:  0    No follow-ups on file.  Beatrice Lecher, MD

## 2022-03-19 NOTE — Therapy (Signed)
OUTPATIENT PHYSICAL THERAPY TREATMENT   Patient Name: Sherri [name] MRN: [id number] DOB:1955-12-15, 67 y.o., female Today's Date: 03/19/2022  END OF SESSION:  PT End of Session - 03/19/22 0847     Visit Number 4    Number of Visits 16    Date for PT Re-Evaluation 04/30/22    Authorization Type [employer]    PT Start Time 0848    PT Stop Time 0930    PT Time Calculation (min) 42 min    Activity Tolerance Patient tolerated treatment well    Behavior During Therapy WFL for tasks assessed/performed               Past Medical History:  Diagnosis Date   Anxiety    Arthritis    Asthma    COPD (chronic obstructive pulmonary disease) ([place])    Depression    Pneumonia    Recurrent upper respiratory infection (URI)    Past Surgical History:  Procedure Laterality Date   ankle sx  1973   APPENDECTOMY     BREAST REDUCTION SURGERY  06/24/2011   Procedure: MAMMARY REDUCTION  (BREAST);  Surgeon: Cristine Polio, MD;  Location: [facility];  Service: Plastics;  Laterality: Bilateral;   CERVICAL DISC SURGERY     (? laminectomy?)   CESAREAN SECTION  1976   left groin hernia  1974   REDUCTION MAMMAPLASTY     TONSILLECTOMY     Patient Active Problem List   Diagnosis Date Noted   Scalp hematoma 02/25/2022   Thumb pain, left 02/25/2022   Senile purpura ([place]) 10/24/2021   Secondary osteoarthritis of right ankle 09/03/2021   Primary osteoarthritis of left hand 06/01/2021   Bilateral hand numbness 03/09/2021   Restrictive lung disease 11/02/2020   Benign paroxysmal positional vertigo, left 08/18/2020   Hyperlipidemia with target LDL less than 100 07/10/2020   Abnormal weight loss 07/10/2020   Pulmonary emphysema ([place]) 06/09/2020   Cervical fusion syndrome 09/29/2019   Cyst of finger of right hand 07/12/2019   Stress 06/28/2019   Essential hypertension 02/25/2019   Chronic diarrhea 01/21/2019   Chondromalacia of patella, left 03/09/2013   UNSPECIFIED VENOUS  INSUFFICIENCY 09/13/2009   Lumbar degenerative disc disease 09/13/2009   CHRONIC OBSTRUCTIVE PULMONARY DISEASE, ACUTE EXACERBATION 06/15/2009   FATIGUE 10/11/2008   ALLERGIC RHINITIS 03/21/2008   MDD (major depressive disorder) 10/22/2005   Panic attack 10/22/2005   TOBACCO DEPENDENCE 10/22/2005   OSTEOARTHRITIS, UNSPECIFIED 10/22/2005    PCP: Dr Beatrice Lecher   REFERRING PROVIDER: Dr Earnie Larsson  REFERRING DIAG: Cervical fusion syndrome; unspecified inflammatory spondylopathy cervical region   THERAPY DIAG:  Thoracic spine pain  Cervicalgia  Muscle weakness (generalized)  Abnormal posture  Other abnormalities of gait and mobility  Acute left-sided low back pain with left-sided sciatica  Cervical dysfunction  Other symptoms and signs involving the musculoskeletal system  Rationale for Evaluation and Treatment: Rehabilitation  ONSET DATE: "Couple of years"  SUBJECTIVE:  SUBJECTIVE STATEMENT: Patient reports she has not had any issues with dizziness since previous session. Patient reports more stiffness than soreness/pain in neck today.  PERTINENT HISTORY:  Prior C4-C6 ACDF  PAIN:  Are you having pain? Yes: NPRS scale: currently 0/10 Pain location: Most of pain around C7 to T4 Pain description: Soreness Aggravating factors: Gets worse later in the day, every day activities  Relieving factors: "The least I do the better I am"  PRECAUTIONS: Fall  WEIGHT BEARING RESTRICTIONS: No  FALLS:  Has patient fallen in last 6 months? Yes. Number of falls 1 -- reports dizzy spells and did hit her head  LIVING ENVIRONMENT: Lives with: lives with their family and lives with their spouse and grandson Lives in: House/apartment Stairs: Yes: External: 4 steps; on left going  up Has following equipment at home: None  OCCUPATION: Retired  PLOF: Independent  PATIENT GOALS: Improve pain/NT for increased endurance with home tasks  NEXT MD VISIT: April 25, 2022  OBJECTIVE:   DIAGNOSTIC FINDINGS:  MRI - 04/28/21: 1. Prior ACDF at C4-C6 without residual stenosis. 2. Multilevel cervical spondylosis with resultant mild spinal stenosis at C3-4, C6-7, and C7-T1. 3. Multifactorial degenerative changes with resultant multilevel foraminal narrowing as above. Notable findings include moderate right worse than left C4 foraminal stenosis, moderate to severe left C7 foraminal narrowing, with severe right and moderate left C8 and T1 foraminal stenosis. 4. Tiny focus of chronic myelomalacia within the left hemi cord at the level of C5-6.  PATIENT SURVEYS:  FOTO 29; predicted 45  COGNITION: Overall cognitive status: Within functional limits for tasks assessed  SENSATION: Notes bilat UE N/T  POSTURE: Patient presents with head forward posture with increased thoracic kyphosis; shoulders rounded and elevated; scapulae abducted and rotated along the thoracic spine; head of the humerus anterior in orientation.    PALPATION: Mild TTP R suboccipital and bilat UT (pt notes she is not sure how much of that is from her recent fall) Most tender along cervicothoracic paraspinals and bilat periscapular muscles (L worse than R), hypomobile with gross PA mobs along T-spine with tenderness  CERVICAL ROM:   AROM A/PROM (deg) eval  Flexion 24  Extension 40  Right lateral flexion 35  Left lateral flexion 27  Right rotation 35  Left rotation 44   (Blank rows = not tested)  UPPER EXTREMITY ROM:  Active ROM Right eval Left eval  Shoulder flexion ~130 ~130  Shoulder extension    Shoulder abduction ~120 ~120  Shoulder adduction    Shoulder internal rotation To L3 To L3  Shoulder external rotation To T2 To T2  Elbow flexion    Elbow extension    Wrist flexion    Wrist  extension    Wrist ulnar deviation    Wrist radial deviation    Wrist pronation    Wrist supination     (Blank rows = not tested)  UPPER EXTREMITY MMT:  MMT Right eval Left eval  Shoulder flexion 4+ 4  Shoulder extension 3+ 3+  Shoulder abduction 4+ 4  Shoulder adduction    Shoulder internal rotation 5 5  Shoulder external rotation 3+ 3+  Middle trapezius 3- 2+  Lower trapezius 3- 2+  Elbow flexion    Elbow extension    Wrist flexion    Wrist extension    Wrist ulnar deviation    Wrist radial deviation    Wrist pronation    Wrist supination    Grip strength     (Blank rows =  not tested)  VESTIBULAR ASSESSMENT:  03/14/22: Dix-hallpike L: (+) L up beating nystagmus for ~20 sec; no symptoms on 2nd check Dix-hallpike R: No nystagmus    [facility] Adult PT Treatment:                                                DATE: 03/19/2022 Therapeutic Exercise: Seated UT & LS stretches 2x30" each Supine: Shoulder flexion with 2#dowel x10 Shoulder horizontal abd RTB x12 Shoulder flexion w/bent elbows YTB around wrists --> isometric shoulder abd w/stretch strap x10 Standing: Row GTB x15 Isometric shoulder IR/ER walk out RTB x10 each Shoulder extension + cervical rotation GTB w/dowel x15 YTB (small loop) drawing the bow x10 B Doorway pec stretch 2x30" Cervical SNAGs 5x5"    [facility] Adult PT Treatment:                                                DATE: 03/14/22 Therapeutic Exercise: UBE L2, 4 min fwd/bwd Supine Shoulder flexion AROM x10 Shoulder ER red TB 2x10 Shoulder ext red TB 2x10 Shoulder abd AROM x10 Standing Row green TB 2x10 Bow and arrow red TB x10 (mild LOB when stepping back on L) Canalith Repositioning: L Epley maneuver x 2 Self Care: How to perform self Epley maneuver at home    PATIENT EDUCATION:  Education details: POC; HEP  Person educated: Patient Education method: Explanation, Demonstration, Tactile cues, Verbal cues, and Handouts Education  comprehension: verbalized understanding, returned demonstration, verbal cues required, tactile cues required, and needs further education  HOME EXERCISE PROGRAM: Access Code: [email or web address] URL: [email or web address] Date: 03/19/2022 Prepared by: Helane Gunther  Exercises - Shoulder External Rotation and Scapular Retraction with Resistance  - 1 x daily - 7 x weekly - 2 sets - 10 reps - 3 sec hold - Shoulder External Rotation in 45 Degrees Abduction  - 1 x daily - 7 x weekly - 2 sets - 10 reps - 3 sec hold - Shoulder Horizontal Abduction - Thumbs Up  - 1 x daily - 7 x weekly - 2 sets - 10 reps - Standing Scapular Retraction in Abduction  - 1 x daily - 7 x weekly - 2 sets - 10 reps - Standing Cervical Retraction  - 1 x daily - 7 x weekly - 2 sets - 10 reps - 3 sec hold - Seated Assisted Cervical Rotation with Towel  - 2 x daily - 7 x weekly - 1 sets - 10 reps - 5 sec hold  ASSESSMENT:  CLINICAL IMPRESSION:   Patient reported mild dizziness with transitioning from supine to sit, however symptoms resolved quickly with brief seated break. Tactile cues provided to decrease upper trapezius compensation and improve proprioceptive awareness with scapular retraction and depression activation.  OBJECTIVE IMPAIRMENTS: decreased activity tolerance, decreased endurance, decreased mobility, decreased ROM, decreased strength, hypomobility, increased muscle spasms, impaired flexibility, impaired UE functional use, improper body mechanics, postural dysfunction, and pain.    GOALS: Goals reviewed with patient? Yes  SHORT TERM GOALS: Target date: 04/02/2022  Pt will be ind with initial HEP Baseline:  Goal status: INITIAL  2.  Pt will be able to perform all prone mid back exercises against gravity Baseline:  Goal status: INITIAL  LONG TERM GOALS: Target date: 04/30/2022  Pt will be ind with progression and maintenance of HEP Baseline:  Goal status: INITIAL  2.  Pt will report reduced  pain by >/=50% Baseline:  Goal status: INITIAL  3.  Pt will improve shoulder elevation to >/=150 deg to demo improving scapular stability for lifting and overhead tasks Baseline:  Goal status: INITIAL  4.  Pt will be able to lift and carry at least 15# for ADLs and IADLs such as groceries Baseline:  Goal status: INITIAL  5.  Pt will have improved FOTO score to >/=53 Baseline:  Goal status: INITIAL  6.  Pt will note reduction in midback N/T by >/=50% Baseline:  Goal status: INITIAL  7.  Pt will report no s/s of dizziness with all activities Baseline:  Goal status: INITIAL    PLAN:  PT FREQUENCY: 2x/week  PT DURATION: 8 weeks  PLANNED INTERVENTIONS: Therapeutic exercises, Therapeutic activity, Neuromuscular re-education, Balance training, Gait training, Patient/Family education, Self Care, Joint mobilization, Aquatic Therapy, Dry Needling, Electrical stimulation, Spinal mobilization, Cryotherapy, Moist heat, Taping, Ultrasound, Ionotophoresis '4mg'$ /ml Dexamethasone, Manual therapy, canalith repositioning, vestibular rehab and Re-evaluation  PLAN FOR NEXT SESSION: review and progress exercises; continue with postural correction and education; manual work, DN, modalities as indicated.   Hardin Negus, PTA 03/19/2022, 9:34 AM

## 2022-03-19 NOTE — Assessment & Plan Note (Signed)
Need refills on her inhalers.  No recent flare or exacerbation.  She does still smoke.

## 2022-03-21 ENCOUNTER — Ambulatory Visit: Payer: Medicare Other | Admitting: Physical Therapy

## 2022-03-21 ENCOUNTER — Encounter: Payer: Self-pay | Admitting: Physical Therapy

## 2022-03-21 DIAGNOSIS — M5442 Lumbago with sciatica, left side: Secondary | ICD-10-CM | POA: Diagnosis not present

## 2022-03-21 DIAGNOSIS — M539 Dorsopathy, unspecified: Secondary | ICD-10-CM | POA: Diagnosis not present

## 2022-03-21 DIAGNOSIS — M546 Pain in thoracic spine: Secondary | ICD-10-CM

## 2022-03-21 DIAGNOSIS — M6281 Muscle weakness (generalized): Secondary | ICD-10-CM

## 2022-03-21 DIAGNOSIS — R293 Abnormal posture: Secondary | ICD-10-CM

## 2022-03-21 DIAGNOSIS — R42 Dizziness and giddiness: Secondary | ICD-10-CM

## 2022-03-21 DIAGNOSIS — R29898 Other symptoms and signs involving the musculoskeletal system: Secondary | ICD-10-CM | POA: Diagnosis not present

## 2022-03-21 DIAGNOSIS — R2689 Other abnormalities of gait and mobility: Secondary | ICD-10-CM

## 2022-03-21 DIAGNOSIS — M542 Cervicalgia: Secondary | ICD-10-CM | POA: Diagnosis not present

## 2022-03-21 NOTE — Therapy (Signed)
OUTPATIENT PHYSICAL THERAPY TREATMENT   Patient Name: Sherri [name] MRN: [id number] DOB:1955-10-15, 67 y.o., female Today's Date: 03/21/2022  END OF SESSION:  PT End of Session - 03/21/22 0918     Visit Number 5    Number of Visits 16    Date for PT Re-Evaluation 04/30/22    Authorization Type UHC    PT Start Time 0920    PT Stop Time 1000    PT Time Calculation (min) 40 min    Activity Tolerance Patient tolerated treatment well    Behavior During Therapy WFL for tasks assessed/performed               Past Medical History:  Diagnosis Date   Anxiety    Arthritis    Asthma    COPD (chronic obstructive pulmonary disease) ([place])    Depression    Pneumonia    Recurrent upper respiratory infection (URI)    Past Surgical History:  Procedure Laterality Date   ankle sx  1973   APPENDECTOMY     BREAST REDUCTION SURGERY  06/24/2011   Procedure: MAMMARY REDUCTION  (BREAST);  Surgeon: Cristine Polio, MD;  Location: [place];  Service: Plastics;  Laterality: Bilateral;   CERVICAL DISC SURGERY     (? laminectomy?)   CESAREAN SECTION  1976   left groin hernia  1974   REDUCTION MAMMAPLASTY     TONSILLECTOMY     Patient Active Problem List   Diagnosis Date Noted   Scalp hematoma 02/25/2022   Thumb pain, left 02/25/2022   Senile purpura ([place]) 10/24/2021   Secondary osteoarthritis of right ankle 09/03/2021   Primary osteoarthritis of left hand 06/01/2021   Bilateral hand numbness 03/09/2021   Restrictive lung disease 11/02/2020   Benign paroxysmal positional vertigo, left 08/18/2020   Hyperlipidemia with target LDL less than 100 07/10/2020   Abnormal weight loss 07/10/2020   Pulmonary emphysema ([place]) 06/09/2020   Cervical fusion syndrome 09/29/2019   Cyst of finger of right hand 07/12/2019   Stress 06/28/2019   Essential hypertension 02/25/2019   Chronic diarrhea 01/21/2019   Chondromalacia of patella, left 03/09/2013   UNSPECIFIED VENOUS  INSUFFICIENCY 09/13/2009   Lumbar degenerative disc disease 09/13/2009   CHRONIC OBSTRUCTIVE PULMONARY DISEASE, ACUTE EXACERBATION 06/15/2009   FATIGUE 10/11/2008   ALLERGIC RHINITIS 03/21/2008   MDD (major depressive disorder) 10/22/2005   Panic attack 10/22/2005   TOBACCO DEPENDENCE 10/22/2005   OSTEOARTHRITIS, UNSPECIFIED 10/22/2005    PCP: Dr Beatrice Lecher   REFERRING PROVIDER: Dr Earnie Larsson  REFERRING DIAG: Cervical fusion syndrome; unspecified inflammatory spondylopathy cervical region   THERAPY DIAG:  Thoracic spine pain  Cervicalgia  Muscle weakness (generalized)  Abnormal posture  Other abnormalities of gait and mobility  Dizziness and giddiness  Rationale for Evaluation and Treatment: Rehabilitation  ONSET DATE: "Couple of years"  SUBJECTIVE:  SUBJECTIVE STATEMENT: Pt reports she is feeling sore this morning from last session of PT. Pt states dizziness was bad yesterday whenever she did quick turns. Dizziness is not too bad this morning.   PERTINENT HISTORY:  Prior C4-C6 ACDF  PAIN:  Are you having pain? Yes: NPRS scale: currently 0/10 Pain location: Most of pain around C7 to T4 Pain description: Soreness Aggravating factors: Gets worse later in the day, every day activities  Relieving factors: "The least I do the better I am"  PRECAUTIONS: Fall  WEIGHT BEARING RESTRICTIONS: No  FALLS:  Has patient fallen in last 6 months? Yes. Number of falls 1 -- reports dizzy spells and did hit her head  LIVING ENVIRONMENT: Lives with: lives with their family and lives with their spouse and grandson Lives in: House/apartment Stairs: Yes: External: 4 steps; on left going up Has following equipment at home: None  OCCUPATION: Retired  PLOF:  Independent  PATIENT GOALS: Improve pain/NT for increased endurance with home tasks  NEXT MD VISIT: April 25, 2022  OBJECTIVE:   DIAGNOSTIC FINDINGS:  MRI - 04/28/21: 1. Prior ACDF at C4-C6 without residual stenosis. 2. Multilevel cervical spondylosis with resultant mild spinal stenosis at C3-4, C6-7, and C7-T1. 3. Multifactorial degenerative changes with resultant multilevel foraminal narrowing as above. Notable findings include moderate right worse than left C4 foraminal stenosis, moderate to severe left C7 foraminal narrowing, with severe right and moderate left C8 and T1 foraminal stenosis. 4. Tiny focus of chronic myelomalacia within the left hemi cord at the level of C5-6.  PATIENT SURVEYS:  FOTO 42; predicted 101  COGNITION: Overall cognitive status: Within functional limits for tasks assessed  SENSATION: Notes bilat UE N/T  POSTURE: Patient presents with head forward posture with increased thoracic kyphosis; shoulders rounded and elevated; scapulae abducted and rotated along the thoracic spine; head of the humerus anterior in orientation.    PALPATION: Mild TTP R suboccipital and bilat UT (pt notes she is not sure how much of that is from her recent fall) Most tender along cervicothoracic paraspinals and bilat periscapular muscles (L worse than R), hypomobile with gross PA mobs along T-spine with tenderness  CERVICAL ROM:   AROM A/PROM (deg) eval  Flexion 24  Extension 40  Right lateral flexion 35  Left lateral flexion 27  Right rotation 35  Left rotation 44   (Blank rows = not tested)  UPPER EXTREMITY ROM:  Active ROM Right eval Left eval  Shoulder flexion ~130 ~130  Shoulder extension    Shoulder abduction ~120 ~120  Shoulder adduction    Shoulder internal rotation To L3 To L3  Shoulder external rotation To T2 To T2  Elbow flexion    Elbow extension    Wrist flexion    Wrist extension    Wrist ulnar deviation    Wrist radial deviation    Wrist pronation     Wrist supination     (Blank rows = not tested)  UPPER EXTREMITY MMT:  MMT Right eval Left eval  Shoulder flexion 4+ 4  Shoulder extension 3+ 3+  Shoulder abduction 4+ 4  Shoulder adduction    Shoulder internal rotation 5 5  Shoulder external rotation 3+ 3+  Middle trapezius 3- 2+  Lower trapezius 3- 2+  Elbow flexion    Elbow extension    Wrist flexion    Wrist extension    Wrist ulnar deviation    Wrist radial deviation    Wrist pronation    Wrist supination  Grip strength     (Blank rows = not tested)  VESTIBULAR ASSESSMENT:  03/14/22: Dix-hallpike L: (+) L up beating nystagmus for ~20 sec; no symptoms on 2nd check Dix-hallpike R: No nystagmus  03/21/22: Dix-hallpike L: No nystagmus Dix-hallpike R: (+) R upbeating nystagmus ~1 min; mild nystagmus x 3 sec on 2nd check   [facility] Adult PT Treatment:                                                DATE: 03/21/22 Therapeutic Exercise: UBE L3, 4 min fwd/bwd Standing Row green TB 2x10 Shoulder ext green TB 2x10 Shoulder ER red TB reactive iso 2x5 Wall slide shoulder flexion x10 Wall slide scaption x10 Canalith Repositioning: Epley maneuver on R x 2 reps   [facility] Adult PT Treatment:                                                DATE: 03/19/2022 Therapeutic Exercise: Seated UT & LS stretches 2x30" each Supine: Shoulder flexion with 2#dowel x10 Shoulder horizontal abd RTB x12 Shoulder flexion w/bent elbows YTB around wrists --> isometric shoulder abd w/stretch strap x10 Standing: Row GTB x15 Isometric shoulder IR/ER walk out RTB x10 each Shoulder extension + cervical rotation GTB w/dowel x15 YTB (small loop) drawing the bow x10 B Doorway pec stretch 2x30" Cervical SNAGs 5x5"    [facility] Adult PT Treatment:                                                DATE: 03/14/22 Therapeutic Exercise: UBE L2, 4 min fwd/bwd Supine Shoulder flexion AROM x10 Shoulder ER red TB 2x10 Shoulder ext red TB 2x10 Shoulder abd AROM  x10 Standing Row green TB 2x10 Bow and arrow red TB x10 (mild LOB when stepping back on L) Canalith Repositioning: L Epley maneuver x 2 Self Care: How to perform self Epley maneuver at home    PATIENT EDUCATION:  Education details: POC; HEP  Person educated: Patient Education method: Explanation, Demonstration, Tactile cues, Verbal cues, and Handouts Education comprehension: verbalized understanding, returned demonstration, verbal cues required, tactile cues required, and needs further education  HOME EXERCISE PROGRAM: Access Code: [email or web address] URL: [email or web address] Date: 03/19/2022 Prepared by: Helane Gunther  Exercises - Shoulder External Rotation and Scapular Retraction with Resistance  - 1 x daily - 7 x weekly - 2 sets - 10 reps - 3 sec hold - Shoulder External Rotation in 45 Degrees Abduction  - 1 x daily - 7 x weekly - 2 sets - 10 reps - 3 sec hold - Shoulder Horizontal Abduction - Thumbs Up  - 1 x daily - 7 x weekly - 2 sets - 10 reps - Standing Scapular Retraction in Abduction  - 1 x daily - 7 x weekly - 2 sets - 10 reps - Standing Cervical Retraction  - 1 x daily - 7 x weekly - 2 sets - 10 reps - 3 sec hold - Seated Assisted Cervical Rotation with Towel  - 2 x daily - 7 x weekly - 1 sets - 10 reps - 5 sec hold  ASSESSMENT:  CLINICAL IMPRESSION:   No nystagmus or symptoms noted with checking L Marye Round this session. Pt symptomatic on R. Addressed with Epley maneuver. Improved upon 2nd check. Continued to progress midback and scapular strengthening.   OBJECTIVE IMPAIRMENTS: decreased activity tolerance, decreased endurance, decreased mobility, decreased ROM, decreased strength, hypomobility, increased muscle spasms, impaired flexibility, impaired UE functional use, improper body mechanics, postural dysfunction, and pain.    GOALS: Goals reviewed with patient? Yes  SHORT TERM GOALS: Target date: 04/02/2022  Pt will be ind with initial HEP Baseline:   Goal status: INITIAL  2.  Pt will be able to perform all prone mid back exercises against gravity Baseline:  Goal status: INITIAL    LONG TERM GOALS: Target date: 04/30/2022  Pt will be ind with progression and maintenance of HEP Baseline:  Goal status: INITIAL  2.  Pt will report reduced pain by >/=50% Baseline:  Goal status: INITIAL  3.  Pt will improve shoulder elevation to >/=150 deg to demo improving scapular stability for lifting and overhead tasks Baseline:  Goal status: INITIAL  4.  Pt will be able to lift and carry at least 15# for ADLs and IADLs such as groceries Baseline:  Goal status: INITIAL  5.  Pt will have improved FOTO score to >/=53 Baseline:  Goal status: INITIAL  6.  Pt will note reduction in midback N/T by >/=50% Baseline:  Goal status: INITIAL  7.  Pt will report no s/s of dizziness with all activities Baseline:  Goal status: INITIAL    PLAN:  PT FREQUENCY: 2x/week  PT DURATION: 8 weeks  PLANNED INTERVENTIONS: Therapeutic exercises, Therapeutic activity, Neuromuscular re-education, Balance training, Gait training, Patient/Family education, Self Care, Joint mobilization, Aquatic Therapy, Dry Needling, Electrical stimulation, Spinal mobilization, Cryotherapy, Moist heat, Taping, Ultrasound, Ionotophoresis '4mg'$ /ml Dexamethasone, Manual therapy, canalith repositioning, vestibular rehab and Re-evaluation  PLAN FOR NEXT SESSION: review and progress exercises; continue with postural correction and education; manual work, DN, modalities as indicated. Assess response to Epley maneuver and pt's dizziness. Initiate gaze stabilization exercises if indicated.   Caidyn Henricksen April Ma L Trena Dunavan, PT 03/21/2022, 9:18 AM

## 2022-03-26 ENCOUNTER — Encounter: Payer: Self-pay | Admitting: Physical Therapy

## 2022-03-26 ENCOUNTER — Ambulatory Visit: Payer: Medicare Other | Admitting: Physical Therapy

## 2022-03-26 DIAGNOSIS — M542 Cervicalgia: Secondary | ICD-10-CM

## 2022-03-26 DIAGNOSIS — R2689 Other abnormalities of gait and mobility: Secondary | ICD-10-CM | POA: Diagnosis not present

## 2022-03-26 DIAGNOSIS — M546 Pain in thoracic spine: Secondary | ICD-10-CM | POA: Diagnosis not present

## 2022-03-26 DIAGNOSIS — R42 Dizziness and giddiness: Secondary | ICD-10-CM | POA: Diagnosis not present

## 2022-03-26 DIAGNOSIS — M5442 Lumbago with sciatica, left side: Secondary | ICD-10-CM | POA: Diagnosis not present

## 2022-03-26 DIAGNOSIS — M6281 Muscle weakness (generalized): Secondary | ICD-10-CM | POA: Diagnosis not present

## 2022-03-26 DIAGNOSIS — R293 Abnormal posture: Secondary | ICD-10-CM

## 2022-03-26 DIAGNOSIS — M539 Dorsopathy, unspecified: Secondary | ICD-10-CM | POA: Diagnosis not present

## 2022-03-26 DIAGNOSIS — R29898 Other symptoms and signs involving the musculoskeletal system: Secondary | ICD-10-CM | POA: Diagnosis not present

## 2022-03-26 NOTE — Therapy (Addendum)
OUTPATIENT PHYSICAL THERAPY TREATMENT AND DISCHARGE  PHYSICAL THERAPY DISCHARGE SUMMARY  Visits from Start of Care: 6  Current functional level related to goals / functional outcomes: See below   Remaining deficits: See below   Education / Equipment: See below   Patient agrees to discharge. Patient goals were met. Patient is being discharged due to being pleased with the current functional level.   Patient Name: Sherri [name] MRN: [id number] DOB:02-07-1955, 67 y.o., female Today's Date: 03/26/2022  END OF SESSION:  PT End of Session - 03/26/22 0930     Visit Number 6    Number of Visits 16    Date for PT Re-Evaluation 04/30/22    Authorization Type UHC    PT Start Time 0930    PT Stop Time 1010    PT Time Calculation (min) 40 min    Activity Tolerance Patient tolerated treatment well    Behavior During Therapy WFL for tasks assessed/performed               Past Medical History:  Diagnosis Date   Anxiety    Arthritis    Asthma    COPD (chronic obstructive pulmonary disease) ([facility])    Depression    Pneumonia    Recurrent upper respiratory infection ([facility])    Past Surgical History:  Procedure Laterality Date   ankle sx  1973   APPENDECTOMY     BREAST REDUCTION SURGERY  06/24/2011   Procedure: MAMMARY REDUCTION  (BREAST);  Surgeon: Louisa Second, MD;  Location: [facility];  Service: Plastics;  Laterality: Bilateral;   CERVICAL DISC SURGERY     (? laminectomy?)   CESAREAN SECTION  1976   left groin hernia  1974   REDUCTION MAMMAPLASTY     TONSILLECTOMY     Patient Active Problem List   Diagnosis Date Noted   Scalp hematoma 02/25/2022   Thumb pain, left 02/25/2022   Senile purpura ([facility]) 10/24/2021   Secondary osteoarthritis of right ankle 09/03/2021   Primary osteoarthritis of left hand 06/01/2021   Bilateral hand numbness 03/09/2021   Restrictive lung disease 11/02/2020   Benign paroxysmal positional vertigo, left 08/18/2020    Hyperlipidemia with target LDL less than 100 07/10/2020   Abnormal weight loss 07/10/2020   Pulmonary emphysema ([facility]) 06/09/2020   Cervical fusion syndrome 09/29/2019   Cyst of finger of right hand 07/12/2019   Stress 06/28/2019   Essential hypertension 02/25/2019   Chronic diarrhea 01/21/2019   Chondromalacia of patella, left 03/09/2013   UNSPECIFIED VENOUS INSUFFICIENCY 09/13/2009   Lumbar degenerative disc disease 09/13/2009   CHRONIC OBSTRUCTIVE PULMONARY DISEASE, ACUTE EXACERBATION 06/15/2009   FATIGUE 10/11/2008   ALLERGIC RHINITIS 03/21/2008   MDD (major depressive disorder) 10/22/2005   Panic attack 10/22/2005   TOBACCO DEPENDENCE 10/22/2005   OSTEOARTHRITIS, UNSPECIFIED 10/22/2005    PCP: Dr Nani Gasser   REFERRING PROVIDER: Dr Julio Sicks  REFERRING DIAG: Cervical fusion syndrome; unspecified inflammatory spondylopathy cervical region   THERAPY DIAG:  Thoracic spine pain  Cervicalgia  Muscle weakness (generalized)  Abnormal posture  Other abnormalities of gait and mobility  Dizziness and giddiness  Rationale for Evaluation and Treatment: Rehabilitation  ONSET DATE: "Couple of years"  SUBJECTIVE:  SUBJECTIVE STATEMENT: Pt states she's had a rough time adjusting to the time change. Pt reports she's stiff from pulling weeds in the yard. Pt states the dizziness is not as bad. She has not had any pain.   PERTINENT HISTORY:  Prior C4-C6 ACDF  PAIN:  Are you having pain? Yes: NPRS scale: currently 0/10 Pain location: Most of pain around C7 to T4 Pain description: Soreness Aggravating factors: Gets worse later in the day, every day activities  Relieving factors: "The least I do the better I am"  PRECAUTIONS: Fall  WEIGHT BEARING RESTRICTIONS:  No  FALLS:  Has patient fallen in last 6 months? Yes. Number of falls 1 -- reports dizzy spells and did hit her head  LIVING ENVIRONMENT: Lives with: lives with their family and lives with their spouse and grandson Lives in: House/apartment Stairs: Yes: External: 4 steps; on left going up Has following equipment at home: None  OCCUPATION: Retired  PLOF: Independent  PATIENT GOALS: Improve pain/NT for increased endurance with home tasks  NEXT MD VISIT: April 25, 2022  OBJECTIVE:   DIAGNOSTIC FINDINGS:  MRI - 04/28/21: 1. Prior ACDF at C4-C6 without residual stenosis. 2. Multilevel cervical spondylosis with resultant mild spinal stenosis at C3-4, C6-7, and C7-T1. 3. Multifactorial degenerative changes with resultant multilevel foraminal narrowing as above. Notable findings include moderate right worse than left C4 foraminal stenosis, moderate to severe left C7 foraminal narrowing, with severe right and moderate left C8 and T1 foraminal stenosis. 4. Tiny focus of chronic myelomalacia within the left hemi cord at the level of C5-6.  PATIENT SURVEYS:  FOTO 45; predicted 59  COGNITION: Overall cognitive status: Within functional limits for tasks assessed  SENSATION: Notes bilat UE N/T  POSTURE: Patient presents with head forward posture with increased thoracic kyphosis; shoulders rounded and elevated; scapulae abducted and rotated along the thoracic spine; head of the humerus anterior in orientation.    PALPATION: Mild TTP R suboccipital and bilat UT (pt notes she is not sure how much of that is from her recent fall) Most tender along cervicothoracic paraspinals and bilat periscapular muscles (L worse than R), hypomobile with gross PA mobs along T-spine with tenderness  CERVICAL ROM:   AROM A/PROM (deg) eval AROM 03/26/22  Flexion 24 30  Extension 40 50  Right lateral flexion 35 35  Left lateral flexion 27 30  Right rotation 35 55  Left rotation 44 60   (Blank rows = not  tested)  UPPER EXTREMITY ROM:  Active ROM Right eval Left eval R/L 03/26/22  Shoulder flexion ~130 ~130 155/155  Shoulder extension     Shoulder abduction ~120 ~120 165/160  Shoulder adduction     Shoulder internal rotation To L3 To L3 To T4/To T4  Shoulder external rotation To T2 To T2 To T3/To T2  Elbow flexion     Elbow extension     Wrist flexion     Wrist extension     Wrist ulnar deviation     Wrist radial deviation     Wrist pronation     Wrist supination      (Blank rows = not tested)  UPPER EXTREMITY MMT:  MMT Right eval Left eval  Shoulder flexion 4+ 4  Shoulder extension 3+ 3+  Shoulder abduction 4+ 4  Shoulder adduction    Shoulder internal rotation 5 5  Shoulder external rotation 3+ 3+  Middle trapezius 3- 2+  Lower trapezius 3- 2+  Elbow flexion    Elbow extension  Wrist flexion    Wrist extension    Wrist ulnar deviation    Wrist radial deviation    Wrist pronation    Wrist supination    Grip strength     (Blank rows = not tested)  VESTIBULAR ASSESSMENT:  03/14/22: Dix-hallpike L: (+) L up beating nystagmus for ~20 sec; no symptoms on 2nd check Dix-hallpike R: No nystagmus  03/21/22: Dix-hallpike L: No nystagmus Dix-hallpike R: (+) R upbeating nystagmus ~1 min; mild nystagmus x 3 sec on 2nd check  [facility] Adult PT Treatment:                                                DATE: 03/26/22 Therapeutic Exercise: UBE L3, 4 min fwd/bwd Farmer's carry 2 5# KB, 1 15# KB walking 1 lap around gym Therapeutic Activity: Rechecked goals    [facility] Adult PT Treatment:                                                DATE: 03/21/22 Therapeutic Exercise: UBE L3, 4 min fwd/bwd Standing Row green TB 2x10 Shoulder ext green TB 2x10 Shoulder ER red TB reactive iso 2x5 Wall slide shoulder flexion x10 Wall slide scaption x10 Canalith Repositioning: Epley maneuver on R x 2 reps   [facility] Adult PT Treatment:                                                DATE:  03/19/2022 Therapeutic Exercise: Seated UT & LS stretches 2x30" each Supine: Shoulder flexion with 2#dowel x10 Shoulder horizontal abd RTB x12 Shoulder flexion w/bent elbows YTB around wrists --> isometric shoulder abd w/stretch strap x10 Standing: Row GTB x15 Isometric shoulder IR/ER walk out RTB x10 each Shoulder extension + cervical rotation GTB w/dowel x15 YTB (small loop) drawing the bow x10 B Doorway pec stretch 2x30" Cervical SNAGs 5x5"    [facility] Adult PT Treatment:                                                DATE: 03/14/22 Therapeutic Exercise: UBE L2, 4 min fwd/bwd Supine Shoulder flexion AROM x10 Shoulder ER red TB 2x10 Shoulder ext red TB 2x10 Shoulder abd AROM x10 Standing Row green TB 2x10 Bow and arrow red TB x10 (mild LOB when stepping back on L) Canalith Repositioning: L Epley maneuver x 2 Self Care: How to perform self Epley maneuver at home    PATIENT EDUCATION:  Education details: POC; HEP  Person educated: Patient Education method: Explanation, Demonstration, Tactile cues, Verbal cues, and Handouts Education comprehension: verbalized understanding, returned demonstration, verbal cues required, tactile cues required, and needs further education  HOME EXERCISE PROGRAM: Access Code: [email or web address] URL: [email or web address] Date: 03/19/2022 Prepared by: Carlynn Herald  Exercises - Shoulder External Rotation and Scapular Retraction with Resistance  - 1 x daily - 7 x weekly - 2 sets - 10 reps - 3 sec hold - Shoulder External Rotation in 45 Degrees Abduction  -  1 x daily - 7 x weekly - 2 sets - 10 reps - 3 sec hold - Shoulder Horizontal Abduction - Thumbs Up  - 1 x daily - 7 x weekly - 2 sets - 10 reps - Standing Scapular Retraction in Abduction  - 1 x daily - 7 x weekly - 2 sets - 10 reps - Standing Cervical Retraction  - 1 x daily - 7 x weekly - 2 sets - 10 reps - 3 sec hold - Seated Assisted Cervical Rotation with Towel  - 2 x daily - 7 x  weekly - 1 sets - 10 reps - 5 sec hold  ASSESSMENT:  CLINICAL IMPRESSION:   Pt has met or partially met all of her LTGs. Pt has been doing very well with her HEP. She continues to have mild dizziness with bending and coming up -- not sure if this is blood pressure related. Pt deferred BPPV testing today. Discussed with pt that we will keep her chart open for a month in case of any exacerbations of symptoms. Otherwise, pt feels good about her progress and HEP. Shoulder and neck ROM have improved.   OBJECTIVE IMPAIRMENTS: decreased activity tolerance, decreased endurance, decreased mobility, decreased ROM, decreased strength, hypomobility, increased muscle spasms, impaired flexibility, impaired UE functional use, improper body mechanics, postural dysfunction, and pain.    GOALS: Goals reviewed with patient? Yes  SHORT TERM GOALS: Target date: 04/02/2022  Pt will be ind with initial HEP Baseline:  Goal status: MET  2.  Pt will be able to perform all prone mid back exercises against gravity Baseline:  Goal status: MET    LONG TERM GOALS: Target date: 04/30/2022  Pt will be ind with progression and maintenance of HEP Baseline:  Goal status: MET  2.  Pt will report reduced pain by >/=50% Baseline:  Goal status: MET reports no pain 03/26/22  3.  Pt will improve shoulder elevation to >/=150 deg to demo improving scapular stability for lifting and overhead tasks Baseline:  Goal status: MET 03/26/22 see AROM above  4.  Pt will be able to lift and carry at least 15# for ADLs and IADLs such as groceries Baseline:  Goal status: MET  5.  Pt will have improved FOTO score to >/=53 Baseline: 61 Goal status: MET 03/26/22  6.  Pt will note reduction in midback N/T by >/=50% Baseline:  Goal status: PARTIALLY MET 45-50% per pt report 3/12  7.  Pt will report no s/s of dizziness with all activities Baseline:  Goal status: PARTIALLY MET only with bending and coming up  03/26/22    PLAN:  PT FREQUENCY: 2x/week  PT DURATION: 8 weeks  PLANNED INTERVENTIONS: Therapeutic exercises, Therapeutic activity, Neuromuscular re-education, Balance training, Gait training, Patient/Family education, Self Care, Joint mobilization, Aquatic Therapy, Dry Needling, Electrical stimulation, Spinal mobilization, Cryotherapy, Moist heat, Taping, Ultrasound, Ionotophoresis /ml Dexamethasone, Manual therapy, canalith repositioning, vestibular rehab and Re-evaluation  PLAN FOR NEXT SESSION: review and progress exercises; continue with postural correction and education; manual work, DN, modalities as indicated. Assess response to Epley maneuver and pt's dizziness. Initiate gaze stabilization exercises if indicated.   Dametria Tuzzolino April Ma L [place], PT 03/26/2022, 9:31 AM

## 2022-03-28 ENCOUNTER — Ambulatory Visit: Payer: Medicare Other | Admitting: Physical Therapy

## 2022-03-28 NOTE — Therapy (Deleted)
OUTPATIENT PHYSICAL THERAPY TREATMENT   Patient Name: Sherri [name] MRN: [id number] DOB:December 05, 1955, 67 y.o., female Today's Date: 03/28/2022  END OF SESSION:      Past Medical History:  Diagnosis Date   Anxiety    Arthritis    Asthma    COPD (chronic obstructive pulmonary disease) (HCC)    Depression    Pneumonia    Recurrent upper respiratory infection (URI)    Past Surgical History:  Procedure Laterality Date   ankle sx  1973   APPENDECTOMY     BREAST REDUCTION SURGERY  06/24/2011   Procedure: MAMMARY REDUCTION  (BREAST);  Surgeon: Cristine Polio, MD;  Location: [facility];  Service: Plastics;  Laterality: Bilateral;   CERVICAL DISC SURGERY     (? laminectomy?)   CESAREAN SECTION  1976   left groin hernia  1974   REDUCTION MAMMAPLASTY     TONSILLECTOMY     Patient Active Problem List   Diagnosis Date Noted   Scalp hematoma 02/25/2022   Thumb pain, left 02/25/2022   Senile purpura ([place]) 10/24/2021   Secondary osteoarthritis of right ankle 09/03/2021   Primary osteoarthritis of left hand 06/01/2021   Bilateral hand numbness 03/09/2021   Restrictive lung disease 11/02/2020   Benign paroxysmal positional vertigo, left 08/18/2020   Hyperlipidemia with target LDL less than 100 07/10/2020   Abnormal weight loss 07/10/2020   Pulmonary emphysema ([place]) 06/09/2020   Cervical fusion syndrome 09/29/2019   Cyst of finger of right hand 07/12/2019   Stress 06/28/2019   Essential hypertension 02/25/2019   Chronic diarrhea 01/21/2019   Chondromalacia of patella, left 03/09/2013   UNSPECIFIED VENOUS INSUFFICIENCY 09/13/2009   Lumbar degenerative disc disease 09/13/2009   CHRONIC OBSTRUCTIVE PULMONARY DISEASE, ACUTE EXACERBATION 06/15/2009   FATIGUE 10/11/2008   ALLERGIC RHINITIS 03/21/2008   MDD (major depressive disorder) 10/22/2005   Panic attack 10/22/2005   TOBACCO DEPENDENCE 10/22/2005   OSTEOARTHRITIS, UNSPECIFIED 10/22/2005    PCP: Dr  Beatrice Lecher   REFERRING PROVIDER: Dr Earnie Larsson  REFERRING DIAG: Cervical fusion syndrome; unspecified inflammatory spondylopathy cervical region   THERAPY DIAG:  No diagnosis found.  Rationale for Evaluation and Treatment: Rehabilitation  ONSET DATE: "Couple of years"  SUBJECTIVE:                                                                                                                                                                                                         SUBJECTIVE STATEMENT: Pt states she's had a rough time adjusting to the time change. Pt reports she's stiff  from pulling weeds in the yard. Pt states the dizziness is not as bad. She has not had any pain.   PERTINENT HISTORY:  Prior C4-C6 ACDF  PAIN:  Are you having pain? Yes: NPRS scale: currently 0/10 Pain location: Most of pain around C7 to T4 Pain description: Soreness Aggravating factors: Gets worse later in the day, every day activities  Relieving factors: "The least I do the better I am"  PRECAUTIONS: Fall  WEIGHT BEARING RESTRICTIONS: No  FALLS:  Has patient fallen in last 6 months? Yes. Number of falls 1 -- reports dizzy spells and did hit her head  LIVING ENVIRONMENT: Lives with: lives with their family and lives with their spouse and grandson Lives in: House/apartment Stairs: Yes: External: 4 steps; on left going up Has following equipment at home: None  OCCUPATION: Retired  PLOF: Independent  PATIENT GOALS: Improve pain/NT for increased endurance with home tasks  NEXT MD VISIT: April 25, 2022  OBJECTIVE:   DIAGNOSTIC FINDINGS:  MRI - 04/28/21: 1. Prior ACDF at C4-C6 without residual stenosis. 2. Multilevel cervical spondylosis with resultant mild spinal stenosis at C3-4, C6-7, and C7-T1. 3. Multifactorial degenerative changes with resultant multilevel foraminal narrowing as above. Notable findings include moderate right worse than left C4 foraminal stenosis, moderate to  severe left C7 foraminal narrowing, with severe right and moderate left C8 and T1 foraminal stenosis. 4. Tiny focus of chronic myelomalacia within the left hemi cord at the level of C5-6.  PATIENT SURVEYS:  FOTO 33; predicted 46  COGNITION: Overall cognitive status: Within functional limits for tasks assessed  SENSATION: Notes bilat UE N/T  POSTURE: Patient presents with head forward posture with increased thoracic kyphosis; shoulders rounded and elevated; scapulae abducted and rotated along the thoracic spine; head of the humerus anterior in orientation.    PALPATION: Mild TTP R suboccipital and bilat UT (pt notes she is not sure how much of that is from her recent fall) Most tender along cervicothoracic paraspinals and bilat periscapular muscles (L worse than R), hypomobile with gross PA mobs along T-spine with tenderness  CERVICAL ROM:   AROM A/PROM (deg) eval AROM 03/26/22  Flexion 24 30  Extension 40 50  Right lateral flexion 35 35  Left lateral flexion 27 30  Right rotation 35 55  Left rotation 44 60   (Blank rows = not tested)  UPPER EXTREMITY ROM:  Active ROM Right eval Left eval R/L 03/26/22  Shoulder flexion ~130 ~130 155/155  Shoulder extension     Shoulder abduction ~120 ~120 165/160  Shoulder adduction     Shoulder internal rotation To L3 To L3 To T4/To T4  Shoulder external rotation To T2 To T2 To T3/To T2  Elbow flexion     Elbow extension     Wrist flexion     Wrist extension     Wrist ulnar deviation     Wrist radial deviation     Wrist pronation     Wrist supination      (Blank rows = not tested)  UPPER EXTREMITY MMT:  MMT Right eval Left eval  Shoulder flexion 4+ 4  Shoulder extension 3+ 3+  Shoulder abduction 4+ 4  Shoulder adduction    Shoulder internal rotation 5 5  Shoulder external rotation 3+ 3+  Middle trapezius 3- 2+  Lower trapezius 3- 2+  Elbow flexion    Elbow extension    Wrist flexion    Wrist extension    Wrist ulnar  deviation  Wrist radial deviation    Wrist pronation    Wrist supination    Grip strength     (Blank rows = not tested)  VESTIBULAR ASSESSMENT:  03/14/22: Dix-hallpike L: (+) L up beating nystagmus for ~20 sec; no symptoms on 2nd check Dix-hallpike R: No nystagmus  03/21/22: Dix-hallpike L: No nystagmus Dix-hallpike R: (+) R upbeating nystagmus ~1 min; mild nystagmus x 3 sec on 2nd check   [facility] Adult PT Treatment:                                                DATE: 03/26/22 Therapeutic Exercise: UBE L3, 4 min fwd/bwd Farmer's carry 2 5# KB, 1 15# KB walking 1 lap around gym Therapeutic Activity: Rechecked goals    [facility] Adult PT Treatment:                                                DATE: 03/21/22 Therapeutic Exercise: UBE L3, 4 min fwd/bwd Standing Row green TB 2x10 Shoulder ext green TB 2x10 Shoulder ER red TB reactive iso 2x5 Wall slide shoulder flexion x10 Wall slide scaption x10 Canalith Repositioning: Epley maneuver on R x 2 reps   [facility] Adult PT Treatment:                                                DATE: 03/19/2022 Therapeutic Exercise: Seated UT & LS stretches 2x30" each Supine: Shoulder flexion with 2#dowel x10 Shoulder horizontal abd RTB x12 Shoulder flexion w/bent elbows YTB around wrists --> isometric shoulder abd w/stretch strap x10 Standing: Row GTB x15 Isometric shoulder IR/ER walk out RTB x10 each Shoulder extension + cervical rotation GTB w/dowel x15 YTB (small loop) drawing the bow x10 B Doorway pec stretch 2x30" Cervical SNAGs 5x5"    [facility] Adult PT Treatment:                                                DATE: 03/14/22 Therapeutic Exercise: UBE L2, 4 min fwd/bwd Supine Shoulder flexion AROM x10 Shoulder ER red TB 2x10 Shoulder ext red TB 2x10 Shoulder abd AROM x10 Standing Row green TB 2x10 Bow and arrow red TB x10 (mild LOB when stepping back on L) Canalith Repositioning: L Epley maneuver x 2 Self Care: How to perform self  Epley maneuver at home    PATIENT EDUCATION:  Education details: POC; HEP  Person educated: Patient Education method: Explanation, Demonstration, Tactile cues, Verbal cues, and Handouts Education comprehension: verbalized understanding, returned demonstration, verbal cues required, tactile cues required, and needs further education  HOME EXERCISE PROGRAM: Access Code: [email or web address] URL: [email or web address] Date: 03/19/2022 Prepared by: Helane Gunther  Exercises - Shoulder External Rotation and Scapular Retraction with Resistance  - 1 x daily - 7 x weekly - 2 sets - 10 reps - 3 sec hold - Shoulder External Rotation in 45 Degrees Abduction  - 1 x daily - 7 x weekly - 2 sets - 10 reps -  3 sec hold - Shoulder Horizontal Abduction - Thumbs Up  - 1 x daily - 7 x weekly - 2 sets - 10 reps - Standing Scapular Retraction in Abduction  - 1 x daily - 7 x weekly - 2 sets - 10 reps - Standing Cervical Retraction  - 1 x daily - 7 x weekly - 2 sets - 10 reps - 3 sec hold - Seated Assisted Cervical Rotation with Towel  - 2 x daily - 7 x weekly - 1 sets - 10 reps - 5 sec hold  ASSESSMENT:  CLINICAL IMPRESSION:   *** Pt has met or partially met all of her LTGs. Pt has been doing very well with her HEP. She continues to have mild dizziness with bending and coming up -- not sure if this is blood pressure related. Pt deferred BPPV testing today. Discussed with pt that we will keep her chart open for a month in case of any exacerbations of symptoms. Otherwise, pt feels good about her progress and HEP. Shoulder and neck ROM have improved.   OBJECTIVE IMPAIRMENTS: decreased activity tolerance, decreased endurance, decreased mobility, decreased ROM, decreased strength, hypomobility, increased muscle spasms, impaired flexibility, impaired UE functional use, improper body mechanics, postural dysfunction, and pain.    GOALS: Goals reviewed with patient? Yes  SHORT TERM GOALS: Target date:  04/02/2022  Pt will be ind with initial HEP Baseline:  Goal status: MET  2.  Pt will be able to perform all prone mid back exercises against gravity Baseline:  Goal status: MET    LONG TERM GOALS: Target date: 04/30/2022  Pt will be ind with progression and maintenance of HEP Baseline:  Goal status: MET  2.  Pt will report reduced pain by >/=50% Baseline:  Goal status: MET reports no pain 03/26/22  3.  Pt will improve shoulder elevation to >/=150 deg to demo improving scapular stability for lifting and overhead tasks Baseline:  Goal status: MET 03/26/22 see AROM above  4.  Pt will be able to lift and carry at least 15# for ADLs and IADLs such as groceries Baseline:  Goal status: MET  5.  Pt will have improved FOTO score to >/=53 Baseline: 61 Goal status: MET 03/26/22  6.  Pt will note reduction in midback N/T by >/=50% Baseline:  Goal status: PARTIALLY MET 45-50% per pt report 3/12  7.  Pt will report no s/s of dizziness with all activities Baseline:  Goal status: PARTIALLY MET only with bending and coming up 03/26/22    PLAN:  PT FREQUENCY: 2x/week  PT DURATION: 8 weeks  PLANNED INTERVENTIONS: Therapeutic exercises, Therapeutic activity, Neuromuscular re-education, Balance training, Gait training, Patient/Family education, Self Care, Joint mobilization, Aquatic Therapy, Dry Needling, Electrical stimulation, Spinal mobilization, Cryotherapy, Moist heat, Taping, Ultrasound, Ionotophoresis '4mg'$ /ml Dexamethasone, Manual therapy, canalith repositioning, vestibular rehab and Re-evaluation  PLAN FOR NEXT SESSION: review and progress exercises; continue with postural correction and education; manual work, DN, modalities as indicated. Assess response to Epley maneuver and pt's dizziness. Initiate gaze stabilization exercises if indicated.   Linnette Panella April Ma L Shanikqua Zarzycki, PT 03/28/2022, 7:57 AM

## 2022-04-16 ENCOUNTER — Telehealth: Payer: Self-pay | Admitting: Family Medicine

## 2022-04-16 MED ORDER — CLINDAMYCIN HCL 300 MG PO CAPS: 300.0000 mg | ORAL_CAPSULE | Freq: Three times a day (TID) | ORAL | 0 refills | Status: DC

## 2022-04-16 NOTE — Telephone Encounter (Signed)
Pt called in complaining of an abscessed tooth. She stated that she had spoke with Dr.Metheney when she had an abscessed tooth. Pt says she was told to call in if it happened again and provider would call her in some medication. Pharmacy on file is correct

## 2022-04-16 NOTE — Telephone Encounter (Signed)
Attempted call to patient. Left voice mail message that I would send a Mychart message with response.

## 2022-04-16 NOTE — Telephone Encounter (Signed)
I will send in a prescription for her but she absolutely needs to make an appointment with the dentist.  If this is a recurring issue she has to have that tooth taken care of.  Meds ordered this encounter  Medications   clindamycin (CLEOCIN) 300 MG capsule    Sig: Take 1 capsule (300 mg total) by mouth 3 (three) times daily.    Dispense:  21 capsule    Refill:  0

## 2022-04-17 NOTE — Telephone Encounter (Signed)
Pt advised. She does have an appointment scheduled with the dentist.

## 2022-04-18 ENCOUNTER — Other Ambulatory Visit: Payer: Self-pay | Admitting: Family Medicine

## 2022-04-18 DIAGNOSIS — I10 Essential (primary) hypertension: Secondary | ICD-10-CM

## 2022-05-04 ENCOUNTER — Other Ambulatory Visit: Payer: Self-pay | Admitting: Family Medicine

## 2022-05-04 DIAGNOSIS — I10 Essential (primary) hypertension: Secondary | ICD-10-CM

## 2022-05-11 ENCOUNTER — Other Ambulatory Visit: Payer: Self-pay | Admitting: Family Medicine

## 2022-05-18 ENCOUNTER — Other Ambulatory Visit: Payer: Self-pay | Admitting: Family Medicine

## 2022-05-20 ENCOUNTER — Other Ambulatory Visit: Payer: Self-pay | Admitting: Family Medicine

## 2022-05-20 DIAGNOSIS — J439 Emphysema, unspecified: Secondary | ICD-10-CM

## 2022-05-20 DIAGNOSIS — J302 Other seasonal allergic rhinitis: Secondary | ICD-10-CM

## 2022-06-06 ENCOUNTER — Ambulatory Visit: Payer: Medicare Other | Admitting: Sports Medicine

## 2022-06-12 ENCOUNTER — Other Ambulatory Visit (INDEPENDENT_AMBULATORY_CARE_PROVIDER_SITE_OTHER): Payer: Medicare Other

## 2022-06-12 ENCOUNTER — Encounter: Payer: Self-pay | Admitting: Sports Medicine

## 2022-06-12 ENCOUNTER — Ambulatory Visit (INDEPENDENT_AMBULATORY_CARE_PROVIDER_SITE_OTHER): Payer: Medicare Other

## 2022-06-12 ENCOUNTER — Ambulatory Visit (INDEPENDENT_AMBULATORY_CARE_PROVIDER_SITE_OTHER): Payer: Medicare Other | Admitting: Sports Medicine

## 2022-06-12 VITALS — Wt 147.0 lb

## 2022-06-12 DIAGNOSIS — M6530 Trigger finger, unspecified finger: Secondary | ICD-10-CM | POA: Insufficient documentation

## 2022-06-12 DIAGNOSIS — M1612 Unilateral primary osteoarthritis, left hip: Secondary | ICD-10-CM

## 2022-06-12 DIAGNOSIS — M65312 Trigger thumb, left thumb: Secondary | ICD-10-CM | POA: Diagnosis not present

## 2022-06-12 DIAGNOSIS — M25552 Pain in left hip: Secondary | ICD-10-CM | POA: Diagnosis not present

## 2022-06-12 MED ORDER — CELECOXIB 200 MG PO CAPS
ORAL_CAPSULE | ORAL | 2 refills | Status: DC
Start: 2022-06-12 — End: 2022-09-02

## 2022-06-12 NOTE — Assessment & Plan Note (Signed)
Pain left groin worse with internal rotation, adding x-rays, switching to Celebrex, home physical therapy, return to see me in 6 weeks, hip joint injection if not better.

## 2022-06-12 NOTE — Progress Notes (Signed)
    Procedures performed today:    Procedure: Real-time Ultrasound Guided injection of the left flexor pollicis longus tendon sheath Device: Samsung HS60  Verbal informed consent obtained.  Time-out conducted.  Noted no overlying erythema, induration, or other signs of local infection.  Skin prepped in a sterile fashion.  Local anesthesia: Topical Ethyl chloride.  With sterile technique and under real time ultrasound guidance: Noted tendon sheath effusion, 1/2 cc lidocaine, 1/2 cc kenalog 40 injected easily. Completed without difficulty  Advised to call if fevers/chills, erythema, induration, drainage, or persistent bleeding.  Images permanently stored and available for review in PACS.  Impression: Technically successful ultrasound guided injection.  Independent interpretation of notes and tests performed by another provider:   None.  Brief History, Exam, Impression, and Recommendations:    Primary osteoarthritis of left hip Pain left groin worse with internal rotation, adding x-rays, switching to Celebrex, home physical therapy, return to see me in 6 weeks, hip joint injection if not better.  Trigger thumb, left thumb Severe pain with triggering, injected today.    ____________________________________________ Ihor Austin. Benjamin Stain, M.D., ABFM., CAQSM., AME. Primary Care and Sports Medicine [facility]  Adjunct Professor of Family Medicine  [employer] [facility] [employer] [facility] [employer]  Restaurant manager, fast food

## 2022-06-12 NOTE — Assessment & Plan Note (Signed)
Severe pain with triggering, injected today.

## 2022-07-04 ENCOUNTER — Other Ambulatory Visit: Payer: Self-pay | Admitting: Family Medicine

## 2022-07-24 ENCOUNTER — Ambulatory Visit (INDEPENDENT_AMBULATORY_CARE_PROVIDER_SITE_OTHER): Payer: Medicare Other | Admitting: Sports Medicine

## 2022-07-24 DIAGNOSIS — M1612 Unilateral primary osteoarthritis, left hip: Secondary | ICD-10-CM

## 2022-07-24 DIAGNOSIS — M65312 Trigger thumb, left thumb: Secondary | ICD-10-CM

## 2022-07-24 NOTE — Assessment & Plan Note (Signed)
X-ray confirmed mild femoral acetabular osteophytosis consistent with osteoarthritis, much better with Celebrex and home PT, she is able to walk and keep up with her husband in the grocery store now, continue conditioning indefinitely and return to see me as needed. If this flares again we can consider an injection.

## 2022-07-24 NOTE — Assessment & Plan Note (Signed)
Was also having some painful left thumb triggering, I did a flexor pollicis longus tendon sheath injection with ultrasound guidance at the last visit and she returns today for the most part pain and triggering free.

## 2022-07-24 NOTE — Progress Notes (Signed)
    Procedures performed today:    None.  Independent interpretation of notes and tests performed by another provider:   None.  Brief History, Exam, Impression, and Recommendations:    Primary osteoarthritis of left hip X-ray confirmed mild femoral acetabular osteophytosis consistent with osteoarthritis, much better with Celebrex and home PT, she is able to walk and keep up with her husband in the grocery store now, continue conditioning indefinitely and return to see me as needed. If this flares again we can consider an injection.  Trigger thumb, left thumb Was also having some painful left thumb triggering, I did a flexor pollicis longus tendon sheath injection with ultrasound guidance at the last visit and she returns today for the most part pain and triggering free.    ____________________________________________ Ihor Austin. Benjamin Stain, M.D., ABFM., CAQSM., AME. Primary Care and Sports Medicine [place] [facility]  Adjunct Professor of Family Medicine  [employer] [employer]  Restaurant manager, fast food

## 2022-07-31 ENCOUNTER — Other Ambulatory Visit: Payer: Self-pay | Admitting: Family Medicine

## 2022-07-31 DIAGNOSIS — I10 Essential (primary) hypertension: Secondary | ICD-10-CM

## 2022-08-04 IMAGING — DX DG LUMBAR SPINE COMPLETE 4+V
5 series · 5 of 5 positions shown · non-contrast
Comparison: None.

CLINICAL DATA: Low back pain.

EXAM:
LUMBAR SPINE - COMPLETE 4+ VIEW

[l-spine obl (1 of 2)]
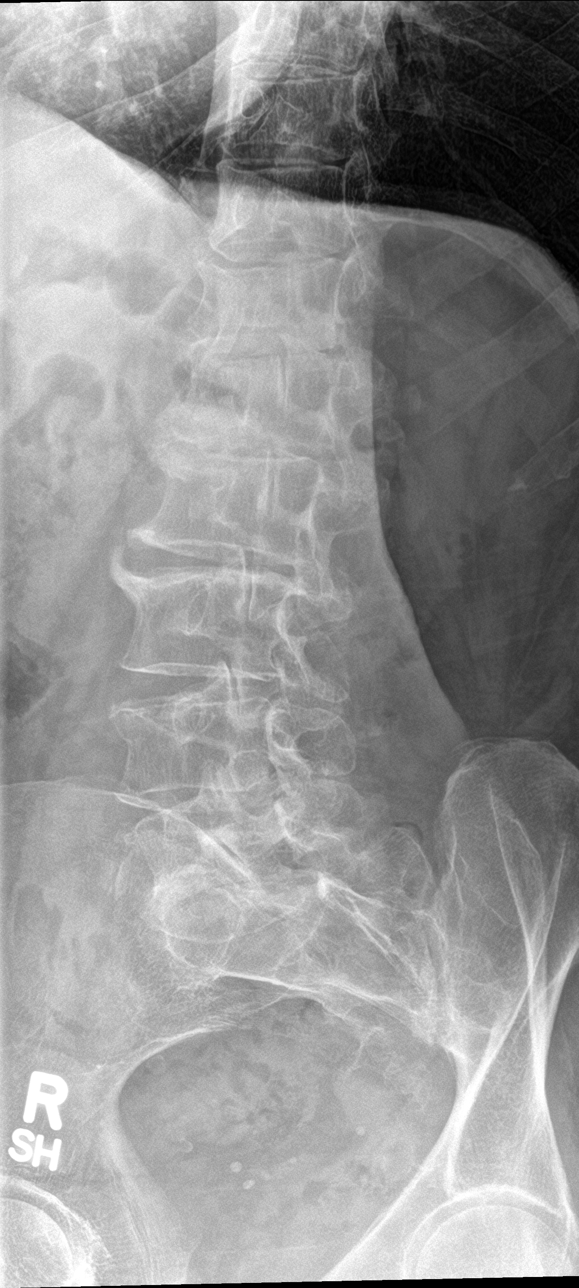

[l-spine obl (2 of 2)]
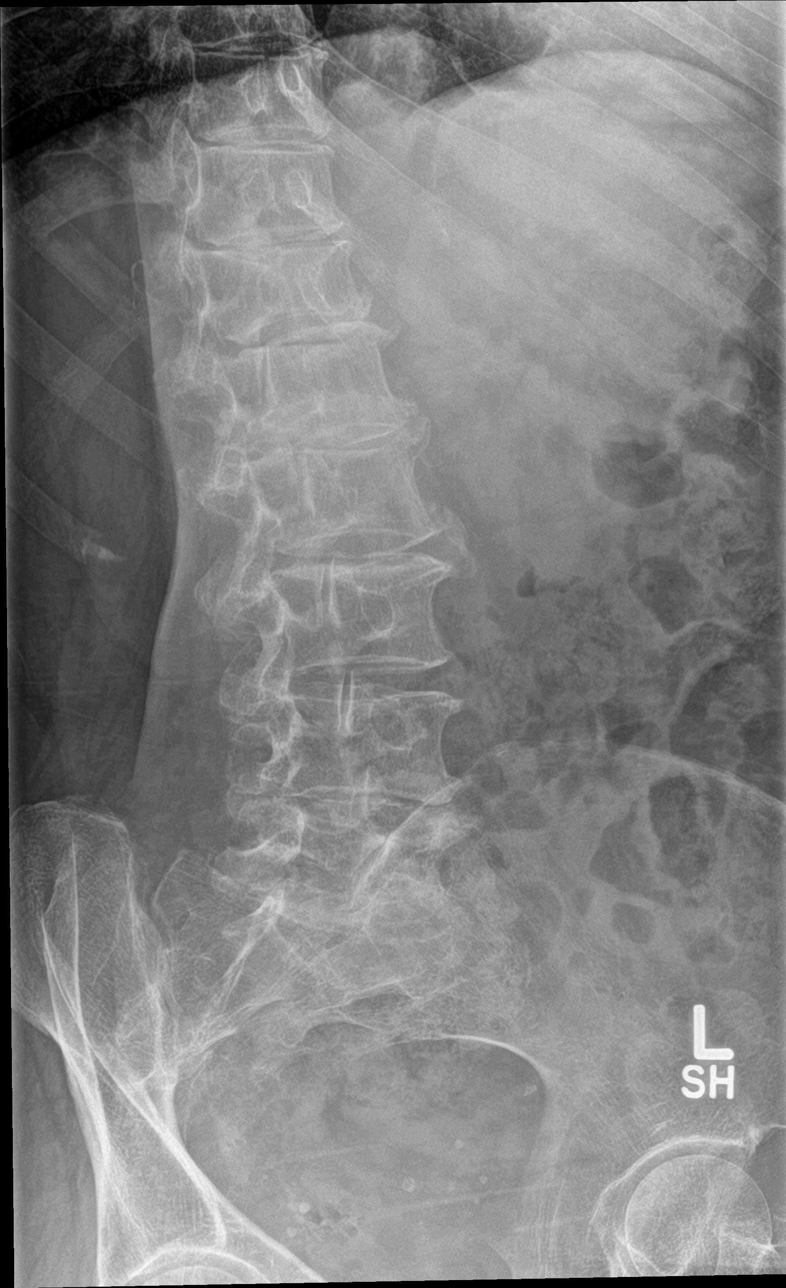

[l-spine lat]
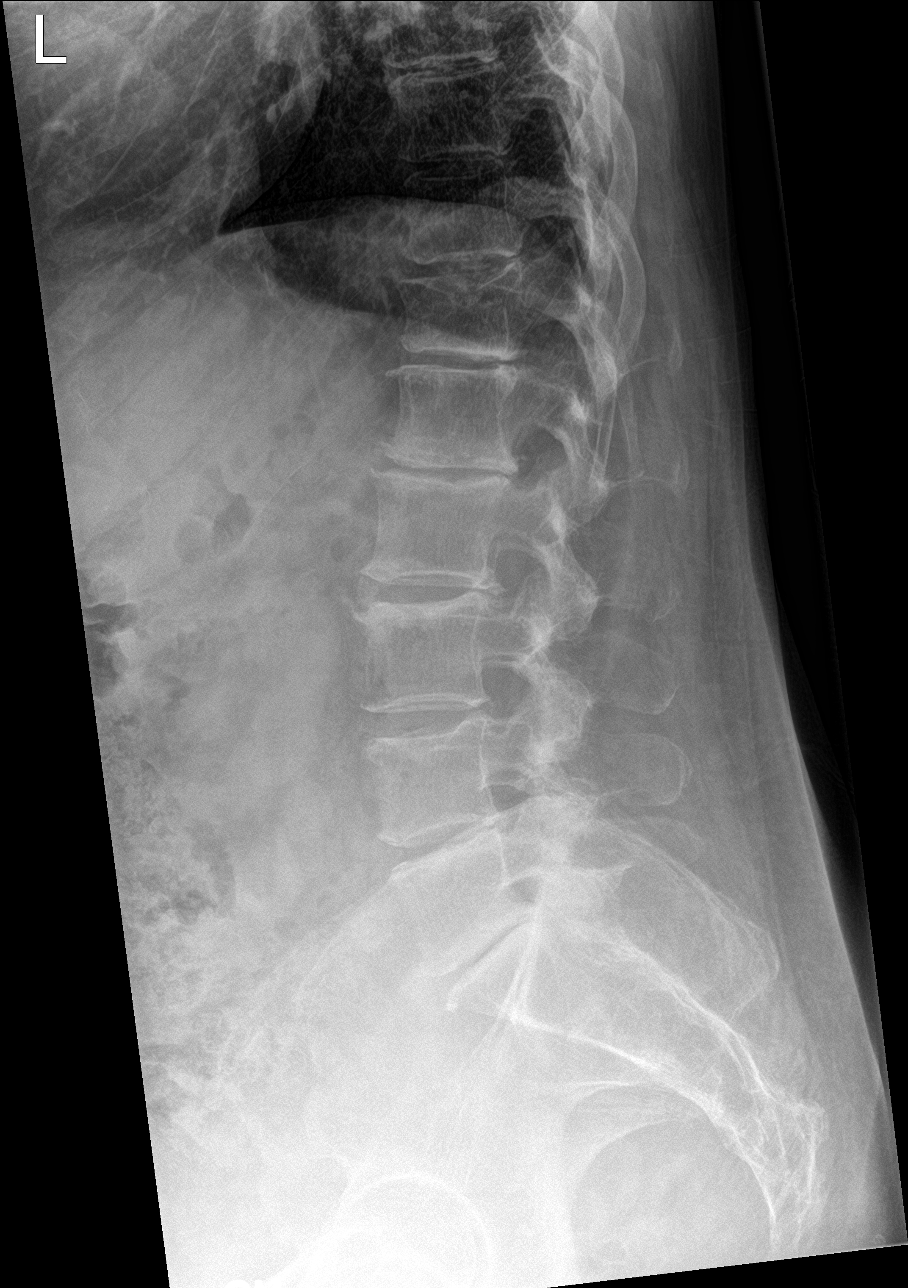

[l-spine spot]
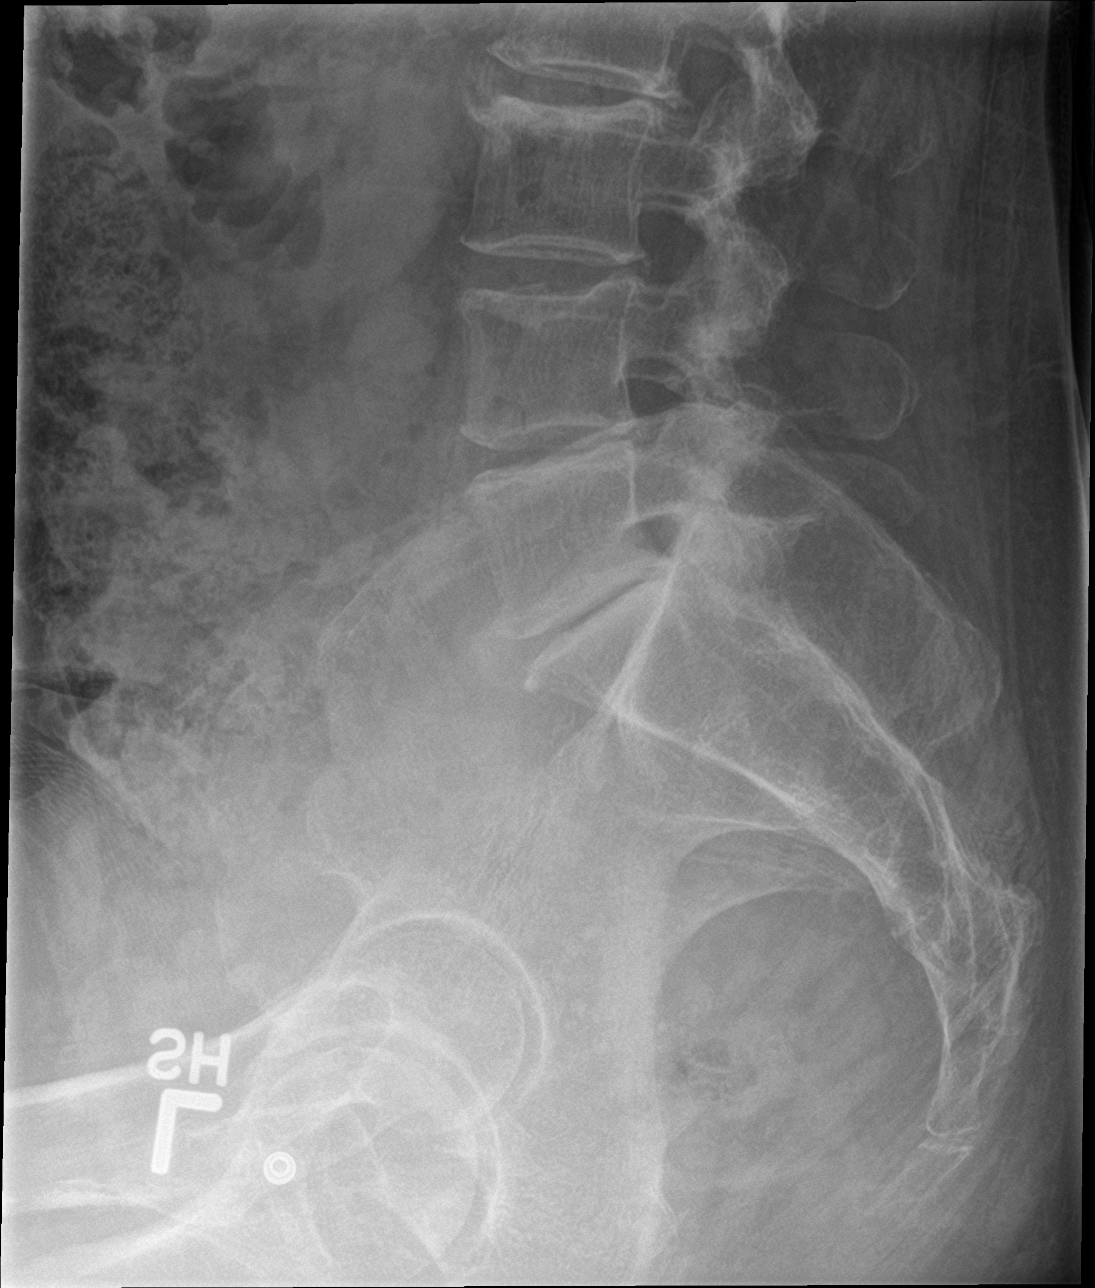

[l-spine ap]
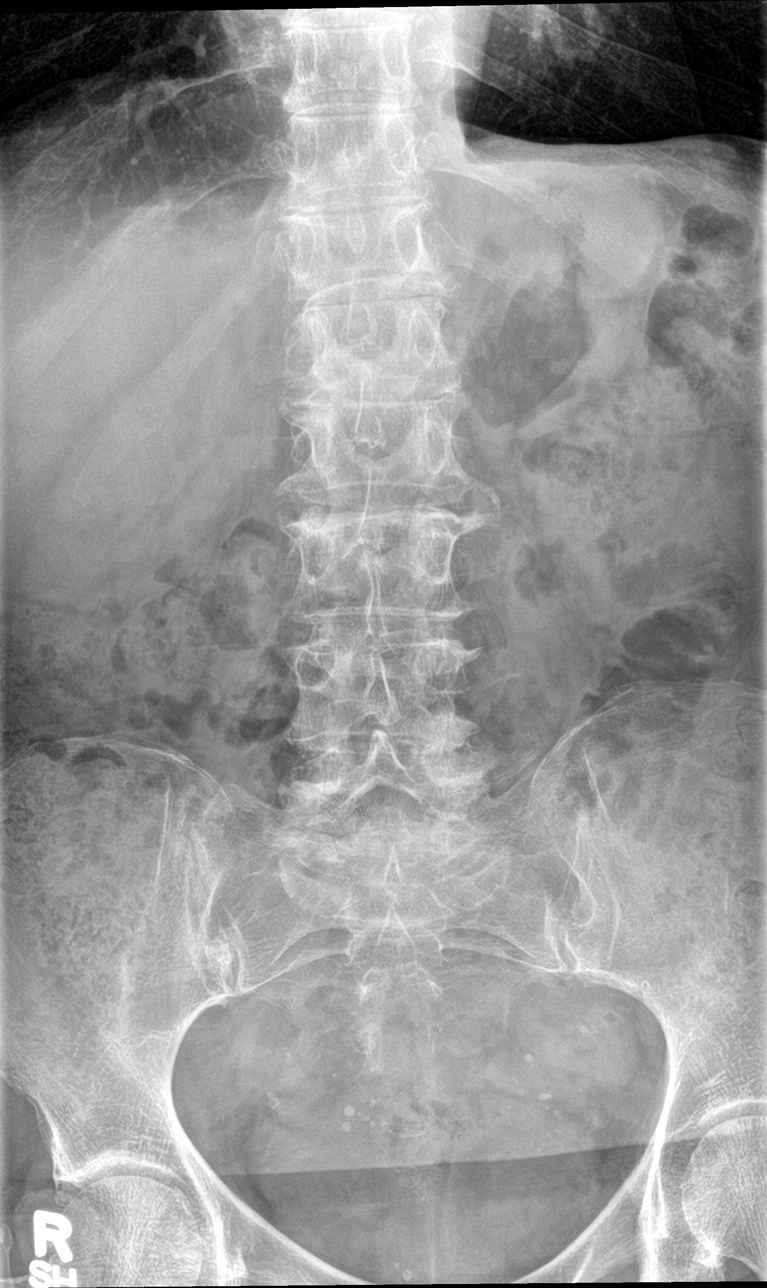

[5 of 5 positions shown; findings below may reference images not displayed]

FINDINGS: Five lumbar type vertebra. Multilevel mild chronic appearing
compression fractures most prominent at superior endplate of L3.
Chronic appearing compression fracture of superior endplate of T12.
No definite acute fracture or subluxation of the lumbar spine. There
is osteopenia with multilevel degenerative changes with disc space
narrowing and endplate irregularity and spurring. The visualized
posterior elements are intact. The soft tissues are unremarkable.
IMPRESSION: 1. No definite acute fracture or subluxation of the lumbar spine.
2. Osteopenia with multilevel degenerative changes.

## 2022-08-04 IMAGING — DX DG CERVICAL SPINE COMPLETE 4+V
6 series · 6 of 6 positions shown · non-contrast
Comparison: Cervical spine radiograph dated 09/02/2019.

CLINICAL DATA: Neck pain.

EXAM:
CERVICAL SPINE - COMPLETE 4+ VIEW

[c-spine lat]
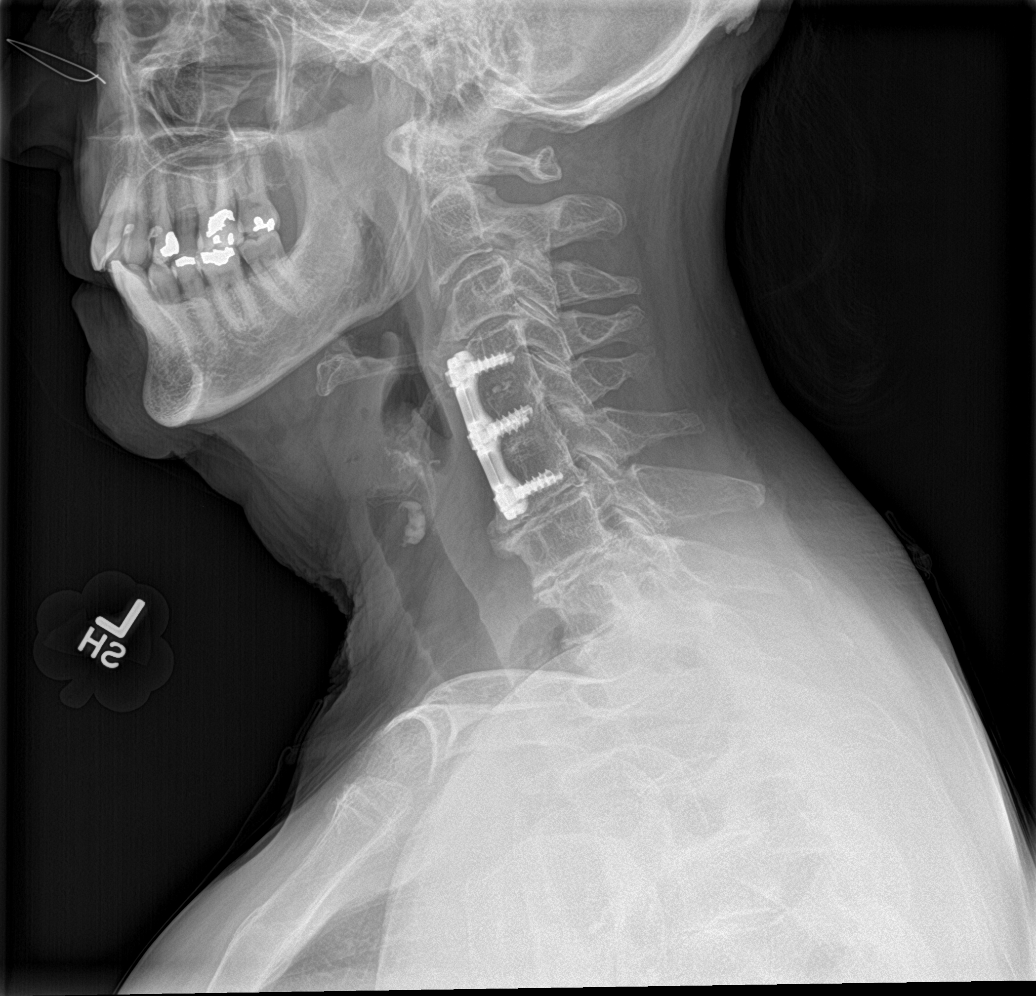

[c-spine obl (1 of 2)]
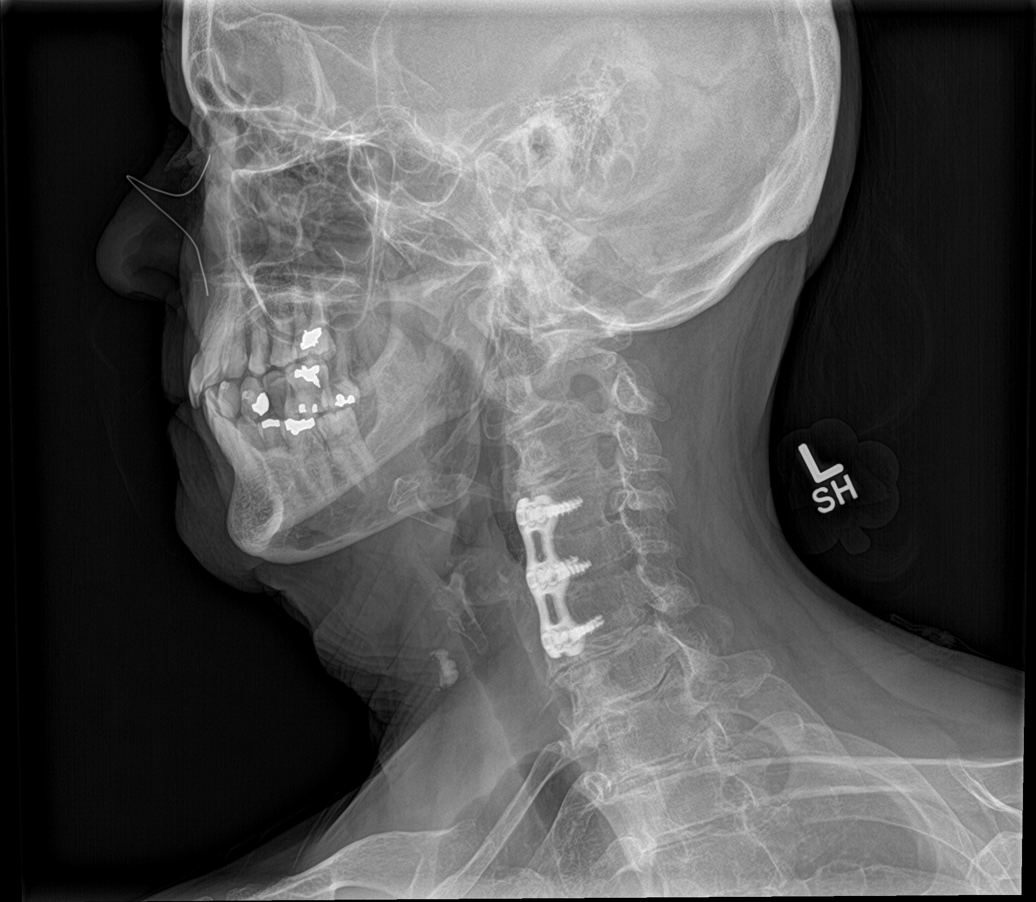

[c-spine obl (2 of 2)]
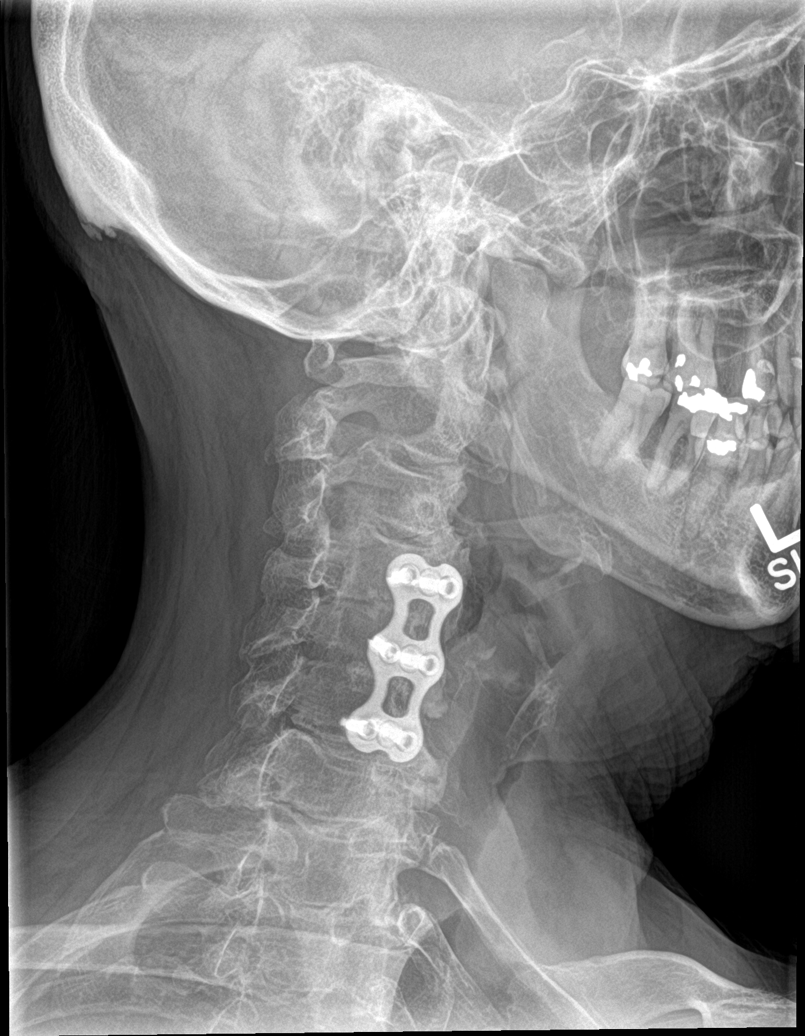

[c-spine ap]
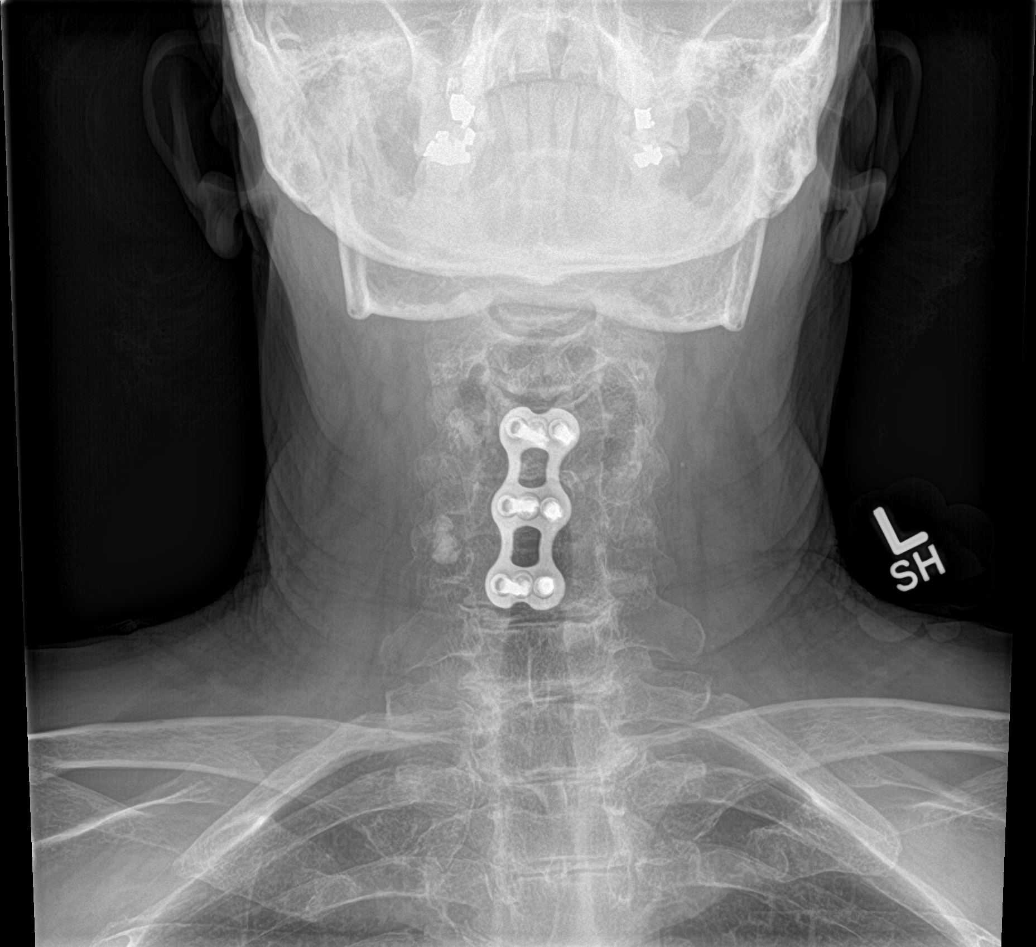

[c-spine open mouth]
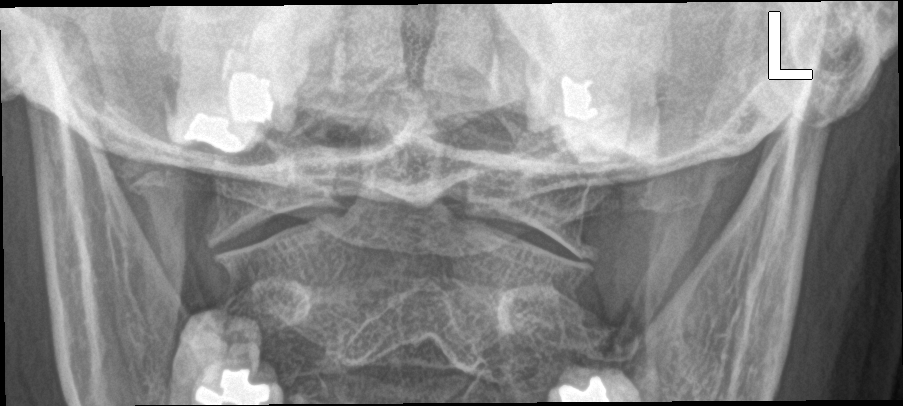

[[person_name]]
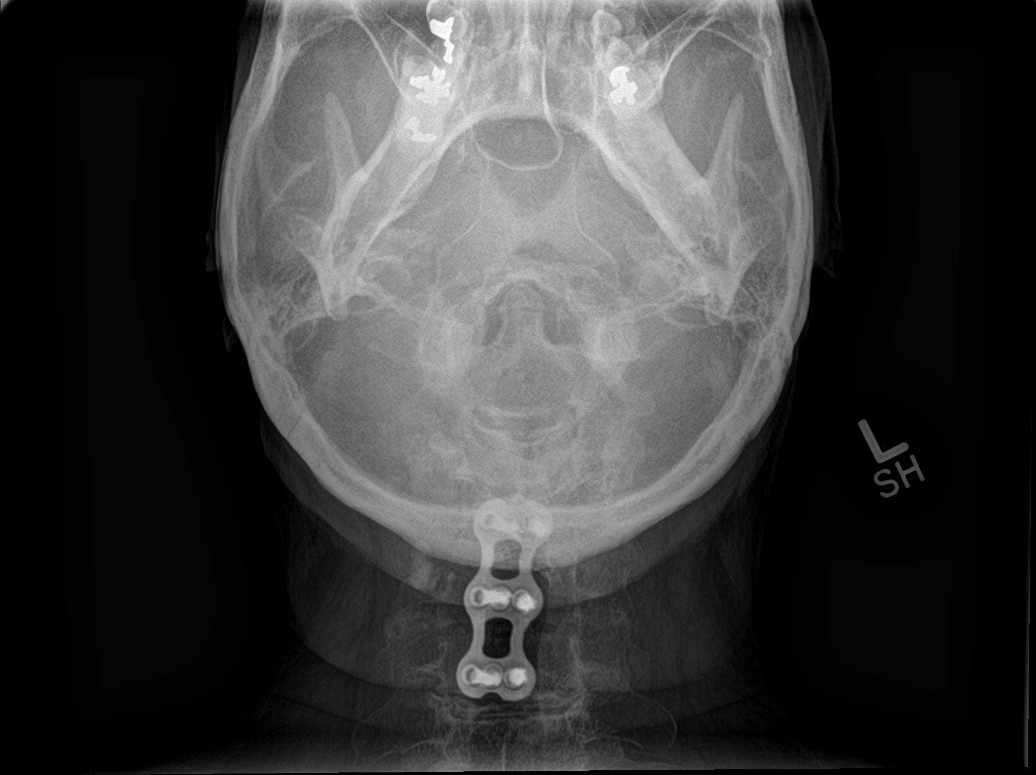

[6 of 6 positions shown; findings below may reference images not displayed]

FINDINGS: There is no acute fracture or subluxation of the cervical spine.
C4-C6 ACDF. The hardware is intact. Multilevel degenerative changes
with disc space narrowing and endplate irregularity and spurring.
The visualized posterior elements and odontoid appear intact. There
is anatomic alignment of the lateral masses of C1 and C2. The soft
tissues are unremarkable.
IMPRESSION: No acute fracture or subluxation.

## 2022-09-02 ENCOUNTER — Other Ambulatory Visit: Payer: Self-pay | Admitting: Sports Medicine

## 2022-09-02 DIAGNOSIS — M1612 Unilateral primary osteoarthritis, left hip: Secondary | ICD-10-CM

## 2022-09-05 ENCOUNTER — Ambulatory Visit: Payer: Medicare Other | Admitting: Family Medicine

## 2022-09-06 ENCOUNTER — Encounter: Payer: Self-pay | Admitting: Family Medicine

## 2022-09-06 ENCOUNTER — Ambulatory Visit (INDEPENDENT_AMBULATORY_CARE_PROVIDER_SITE_OTHER): Payer: Medicare Other | Admitting: Family Medicine

## 2022-09-06 VITALS — BP 128/77 | HR 90 | Resp 20 | Ht 65.0 in | Wt 148.5 lb

## 2022-09-06 DIAGNOSIS — B9689 Other specified bacterial agents as the cause of diseases classified elsewhere: Secondary | ICD-10-CM | POA: Diagnosis not present

## 2022-09-06 DIAGNOSIS — J019 Acute sinusitis, unspecified: Secondary | ICD-10-CM | POA: Diagnosis not present

## 2022-09-06 DIAGNOSIS — J441 Chronic obstructive pulmonary disease with (acute) exacerbation: Secondary | ICD-10-CM | POA: Insufficient documentation

## 2022-09-06 DIAGNOSIS — Z1231 Encounter for screening mammogram for malignant neoplasm of breast: Secondary | ICD-10-CM | POA: Diagnosis not present

## 2022-09-06 DIAGNOSIS — Z780 Asymptomatic menopausal state: Secondary | ICD-10-CM | POA: Diagnosis not present

## 2022-09-06 DIAGNOSIS — Q761 Klippel-Feil syndrome: Secondary | ICD-10-CM | POA: Diagnosis not present

## 2022-09-06 LAB — POC COVID19 BINAXNOW: SARS Coronavirus 2 Ag: NEGATIVE

## 2022-09-06 MED ORDER — ALBUTEROL SULFATE HFA 108 (90 BASE) MCG/ACT IN AERS
INHALATION_SPRAY | RESPIRATORY_TRACT | 6 refills | Status: DC
Start: 2022-09-06 — End: 2023-07-16

## 2022-09-06 MED ORDER — PREDNISONE 20 MG PO TABS
20.0000 mg | ORAL_TABLET | Freq: Two times a day (BID) | ORAL | 0 refills | Status: AC
Start: 2022-09-06 — End: 2022-09-13

## 2022-09-06 MED ORDER — IPRATROPIUM-ALBUTEROL 0.5-2.5 (3) MG/3ML IN SOLN
3.0000 mL | Freq: Four times a day (QID) | RESPIRATORY_TRACT | 3 refills | Status: DC | PRN
Start: 2022-09-06 — End: 2023-02-14

## 2022-09-06 MED ORDER — DOXYCYCLINE HYCLATE 100 MG PO TABS
100.0000 mg | ORAL_TABLET | Freq: Two times a day (BID) | ORAL | 0 refills | Status: AC
Start: 2022-09-06 — End: 2022-09-13

## 2022-09-06 MED ORDER — NEBULIZER MASK ADULT MISC
1.0000 | 0 refills | Status: DC | PRN
Start: 2022-09-06 — End: 2023-07-23

## 2022-09-06 MED ORDER — GABAPENTIN 300 MG PO CAPS
ORAL_CAPSULE | ORAL | 3 refills | Status: DC
Start: 2022-09-06 — End: 2023-04-16

## 2022-09-06 NOTE — Progress Notes (Signed)
Established patient visit   Patient: Sherri [name]   DOB: 09/15/1955   67 y.o. Female  MRN: [id number] Visit Date: 09/06/2022  Today's healthcare provider: Charlton Amor, DO   Chief Complaint  Patient presents with   Sinusitis    Cough congestion upper x4days    SUBJECTIVE    Chief Complaint  Patient presents with   Sinusitis    Cough congestion upper x4days   Sinusitis Associated symptoms include congestion and coughing. Pertinent negatives include no shortness of breath.   HPI     Sinusitis    Additional comments: Cough congestion upper x4days      Last edited by Roselyn Reef, CMA on 09/06/2022 11:21 AM.      Pt presents with cough and congestion for 4 days. Admits to a hx of allergies with sinus pressure and pain around her eyes. Also notes an increase in cough and sputum production.   Review of Systems  Constitutional:  Negative for activity change, fatigue and fever.  HENT:  Positive for congestion.   Respiratory:  Positive for cough. Negative for shortness of breath.   Cardiovascular:  Negative for chest pain.  Gastrointestinal:  Negative for abdominal pain.  Genitourinary:  Negative for difficulty urinating.       Current Meds  Medication Sig   doxycycline (VIBRA-TABS) 100 MG tablet Take 1 tablet (100 mg total) by mouth 2 (two) times daily for 7 days.   ipratropium-albuterol (DUONEB) 0.5-2.5 (3) MG/3ML SOLN Take 3 mLs by nebulization every 6 (six) hours as needed.   predniSONE (DELTASONE) 20 MG tablet Take 1 tablet (20 mg total) by mouth 2 (two) times daily with a meal for 7 days.   Respiratory Therapy Supplies (NEBULIZER MASK ADULT) MISC 1 Device by Does not apply route as needed.    OBJECTIVE    BP 128/77 (BP Location: Left Arm, Patient Position: Sitting, Cuff Size: Normal)   Pulse 90   Resp 20   Ht 5\' 5"  (1.651 m)   Wt 148 lb 8 oz (67.4 kg)   SpO2 99%   BMI 24.71 kg/m   Physical Exam Vitals and nursing note reviewed.   Constitutional:      General: She is not in acute distress.    Appearance: Normal appearance.  HENT:     Head: Normocephalic and atraumatic.     Right Ear: External ear normal.     Left Ear: External ear normal.     Nose: Congestion present.  Eyes:     Conjunctiva/sclera: Conjunctivae normal.  Cardiovascular:     Rate and Rhythm: Normal rate and regular rhythm.  Pulmonary:     Effort: Pulmonary effort is normal.     Breath sounds: Wheezing present.  Neurological:     General: No focal deficit present.     Mental Status: She is alert and oriented to person, place, and time.  Psychiatric:        Mood and Affect: Mood normal.        Behavior: Behavior normal.        Thought Content: Thought content normal.        Judgment: Judgment normal.        ASSESSMENT & PLAN    Problem List Items Addressed This Visit       Respiratory   CHRONIC OBSTRUCTIVE PULMONARY DISEASE, ACUTE EXACERBATION   Relevant Medications   predniSONE (DELTASONE) 20 MG tablet   ipratropium-albuterol (DUONEB) 0.5-2.5 (3) MG/3ML SOLN   albuterol (VENTOLIN  HFA) 108 (90 Base) MCG/ACT inhaler   COPD exacerbation (HCC) - Primary    - due to increase in sputum production and cough will go ahead and treat for copd exacerbation. Wheezing heard on exam and pt given duoneb treatment in office - doxy and prednisone given       Relevant Medications   predniSONE (DELTASONE) 20 MG tablet   doxycycline (VIBRA-TABS) 100 MG tablet   ipratropium-albuterol (DUONEB) 0.5-2.5 (3) MG/3ML SOLN   Respiratory Therapy Supplies (NEBULIZER MASK ADULT) MISC   albuterol (VENTOLIN HFA) 108 (90 Base) MCG/ACT inhaler   Acute bacterial sinusitis    - pt also has a hx of allergic rhinitis along with tenderness to palpation of maxillary and frontal sinuses will treat with doxy       Relevant Medications   predniSONE (DELTASONE) 20 MG tablet   doxycycline (VIBRA-TABS) 100 MG tablet   Other Relevant Orders   POC COVID-19 (Completed)      Musculoskeletal and Integument   Cervical fusion syndrome   Relevant Medications   gabapentin (NEURONTIN) 300 MG capsule   Other Visit Diagnoses     Encounter for screening mammogram for malignant neoplasm of breast       Relevant Orders   MM DIGITAL SCREENING BILATERAL   Post-menopausal       Relevant Orders   DG Bone Density       Return for follow up with PCP about copd .      Meds ordered this encounter  Medications   gabapentin (NEURONTIN) 300 MG capsule    Sig: 2 capsules in the morning and 3 capsules at night    Dispense:  450 capsule    Refill:  3   predniSONE (DELTASONE) 20 MG tablet    Sig: Take 1 tablet (20 mg total) by mouth 2 (two) times daily with a meal for 7 days.    Dispense:  14 tablet    Refill:  0   doxycycline (VIBRA-TABS) 100 MG tablet    Sig: Take 1 tablet (100 mg total) by mouth 2 (two) times daily for 7 days.    Dispense:  14 tablet    Refill:  0   ipratropium-albuterol (DUONEB) 0.5-2.5 (3) MG/3ML SOLN    Sig: Take 3 mLs by nebulization every 6 (six) hours as needed.    Dispense:  120 mL    Refill:  3    Please dispense #120 vials   Respiratory Therapy Supplies (NEBULIZER MASK ADULT) MISC    Sig: 1 Device by Does not apply route as needed.    Dispense:  1 each    Refill:  0   albuterol (VENTOLIN HFA) 108 (90 Base) MCG/ACT inhaler    Sig: TAKE 2 PUFFS BY MOUTH EVERY 6 HOURS AS NEEDED FOR WHEEZE OR SHORTNESS OF BREATH    Dispense:  8.5 each    Refill:  6    Orders Placed This Encounter  Procedures   DG Bone Density    Standing Status:   Future    Standing Expiration Date:   09/06/2023    Order Specific Question:   Reason for Exam (SYMPTOM  OR DIAGNOSIS REQUIRED)    Answer:   Eval bone density    Order Specific Question:   Preferred imaging location?    Answer:   [facility]   MM DIGITAL SCREENING BILATERAL    Standing Status:   Future    Standing Expiration Date:   09/06/2023    Order Specific Question:  Preferred  imaging location?    Answer:   [facility]    Order Specific Question:   Reason for exam:    Answer:   screening or breast cancer    Order Specific Question:   Release to patient    Answer:   Immediate   POC COVID-19    Order Specific Question:   Previously tested for COVID-19    Answer:   Yes    Order Specific Question:   Resident in a congregate (group) care setting    Answer:   No    Order Specific Question:   Employed in healthcare setting    Answer:   No    Order Specific Question:   Pregnant    Answer:   No     Charlton Amor, DO  [facility] Sports Medicine at [facility] [phone]) [phone] (fax)  [facility]

## 2022-09-06 NOTE — Assessment & Plan Note (Signed)
-   pt also has a hx of allergic rhinitis along with tenderness to palpation of maxillary and frontal sinuses will treat with doxy

## 2022-09-06 NOTE — Assessment & Plan Note (Signed)
-   due to increase in sputum production and cough will go ahead and treat for copd exacerbation. Wheezing heard on exam and pt given duoneb treatment in office - doxy and prednisone given

## 2022-09-10 ENCOUNTER — Telehealth: Payer: Self-pay | Admitting: Family Medicine

## 2022-09-10 NOTE — Telephone Encounter (Signed)
Patient called in stating that she needs an RX for a nebulizer machine per Pharmacy. Insurance will cover. Please Advise  [facility] [phone] - [place], [place] - [address]  [address], [place] [address]

## 2022-09-11 ENCOUNTER — Other Ambulatory Visit: Payer: Self-pay | Admitting: Family Medicine

## 2022-09-11 MED ORDER — FULL KIT NEBULIZER SET MISC: 0 refills | Status: DC

## 2022-09-11 NOTE — Telephone Encounter (Signed)
LM for pt that script had been sent in. Roselyn Reef, CMA

## 2022-09-19 ENCOUNTER — Telehealth: Payer: Self-pay | Admitting: Family Medicine

## 2022-09-19 NOTE — Telephone Encounter (Signed)
Will fwd to Dr. Tamera Punt and Tiffany to since this was just written for her on 8/30

## 2022-09-19 NOTE — Telephone Encounter (Signed)
Patient called in stating that the antibiotics that she was given during her last visit with Tamera Punt did not help her, she is still feeling better. She can not come in because her mother is on hospice and only has a few days left. She also stating that she needs tubing for her nebulizer. Please advise

## 2022-09-20 ENCOUNTER — Other Ambulatory Visit: Payer: Self-pay | Admitting: Family Medicine

## 2022-09-20 DIAGNOSIS — J441 Chronic obstructive pulmonary disease with (acute) exacerbation: Secondary | ICD-10-CM

## 2022-09-20 MED ORDER — LEVOFLOXACIN 500 MG PO TABS
500.0000 mg | ORAL_TABLET | Freq: Every day | ORAL | 0 refills | Status: AC
Start: 2022-09-20 — End: 2022-09-27

## 2022-09-20 NOTE — Telephone Encounter (Signed)
Attempted call to patient. Left a voice mail message requesting a return call.  

## 2022-09-20 NOTE — Telephone Encounter (Signed)
Spoke with patient. She states she did already take the full 7 days of steroids prescribed.

## 2022-10-07 ENCOUNTER — Encounter: Payer: Self-pay | Admitting: Family Medicine

## 2022-10-07 ENCOUNTER — Ambulatory Visit (INDEPENDENT_AMBULATORY_CARE_PROVIDER_SITE_OTHER): Payer: Medicare Other | Admitting: Family Medicine

## 2022-10-07 ENCOUNTER — Ambulatory Visit: Payer: Medicare Other

## 2022-10-07 VITALS — BP 131/69 | HR 60 | Ht 65.0 in | Wt 153.0 lb

## 2022-10-07 DIAGNOSIS — J9811 Atelectasis: Secondary | ICD-10-CM | POA: Diagnosis not present

## 2022-10-07 DIAGNOSIS — R051 Acute cough: Secondary | ICD-10-CM

## 2022-10-07 DIAGNOSIS — R0602 Shortness of breath: Secondary | ICD-10-CM

## 2022-10-07 DIAGNOSIS — R062 Wheezing: Secondary | ICD-10-CM

## 2022-10-07 DIAGNOSIS — J441 Chronic obstructive pulmonary disease with (acute) exacerbation: Secondary | ICD-10-CM

## 2022-10-07 DIAGNOSIS — J439 Emphysema, unspecified: Secondary | ICD-10-CM | POA: Diagnosis not present

## 2022-10-07 DIAGNOSIS — I10 Essential (primary) hypertension: Secondary | ICD-10-CM | POA: Diagnosis not present

## 2022-10-07 DIAGNOSIS — R059 Cough, unspecified: Secondary | ICD-10-CM | POA: Diagnosis not present

## 2022-10-07 MED ORDER — IPRATROPIUM-ALBUTEROL 0.5-2.5 (3) MG/3ML IN SOLN
3.0000 mL | Freq: Once | RESPIRATORY_TRACT | Status: AC
Start: 2022-10-07 — End: 2022-10-07
  Administered 2022-10-07: 3 mL via RESPIRATORY_TRACT

## 2022-10-07 NOTE — Progress Notes (Signed)
Hi Yvonne, the great news is that they do not see any pneumonia which is what I was worried about.  Just some inflammation.  I really want you to do your nebulizer every 4 hours while you are awake for the next couple days and then if you are feeling a little better spaced every 6 hours or while you are awake.  And then every 8 hours while you are awake if you are feeling a little better so really wanted try to improve the mucus production and open up your small bronchioles that you can feel like your breathing a little better.  Also we could consider another round of prednisone if that something you are open to but we do not have to.  I also want you to start an antihistamine such as Claritin Zyrtec or Allegra to help dry up some of the secretions.

## 2022-10-07 NOTE — Progress Notes (Unsigned)
Established Patient Office Visit  Subjective   Patient ID: Sherri [name], female    DOB: 16-Jan-1955  Age: 67 y.o. MRN: [id number]  Chief Complaint  Patient presents with   COPD    HPI F/U COPD - seen initially on 8/23 and then again on 9/6 for COPD exacerbation. She had 2 rounds of steroids and a steroid. Still struggling to get better. Maybe 50% better but still has a productive cough and wheezing.  Still smoking. She hasn't been able to rest so feels exhausted.  She hasn't been able to start the nebs bc doesn't have the tubing to attach to her mom's old machine. Was able to get the medicine.   Her mother also recently passed away.  So she has been greiving and trying to get her affairs in order.       ROS    Objective:     BP 131/69   Pulse 60   Ht 5\' 5"  (1.651 m)   Wt 153 lb (69.4 kg)   SpO2 94%   BMI 25.46 kg/m    Physical Exam Vitals and nursing note reviewed.  Constitutional:      Appearance: Normal appearance.  HENT:     Head: Normocephalic and atraumatic.  Eyes:     Conjunctiva/sclera: Conjunctivae normal.  Cardiovascular:     Rate and Rhythm: Normal rate and regular rhythm.  Pulmonary:     Effort: Pulmonary effort is normal.     Breath sounds: Wheezing and rhonchi present.  Skin:    General: Skin is warm and dry.  Neurological:     Mental Status: She is alert.  Psychiatric:        Mood and Affect: Mood normal.      No results found for any visits on 10/07/22.    The ASCVD Risk score (Arnett DK, et al., 2019) failed to calculate for the following reasons:   The valid HDL cholesterol range is 20 to 100 mg/dL    Assessment & Plan:   Problem List Items Addressed This Visit       Cardiovascular and Mediastinum   Essential hypertension    Well controlled. Continue current regimen. Follow up in  6 mo         Respiratory   Pulmonary emphysema (HCC)   CHRONIC OBSTRUCTIVE PULMONARY DISEASE, ACUTE EXACERBATION - Primary    Showed her  how to use the Anoro device. She was having difficulty and just was not sure how well she was doing it.  She thinks she may have wasted some of medication.  She does have a nebulizer machine at home and does have the medication she just does not have the actual tubing she is using her mom's old machine but it does work well.  Gave her the tubing that she used today after we gave her a neb treatment.  She still has significant wheezing after the nebulizer treatment.  Will get chest x-ray today.  She might even need a CAT scan but will start with a chest x-ray I am concerned that she is already had 2 rounds of antibiotics and steroids and really is only about 50% improved.  She still has significant rhonchi and wheezing on exam particularly in the upper lobes bilaterally.      Other Visit Diagnoses     Acute cough       Relevant Orders   DG Chest 2 View (Completed)   SOB (shortness of breath)  Relevant Orders   DG Chest 2 View (Completed)   Wheezing       Relevant Orders   DG Chest 2 View (Completed)      Start nebs Q 4 hours while awake and then taper to Q 6 if improving, and then Q 8 .   Will get CXR to rule out pneumonia.   No follow-ups on file.    Nani Gasser, MD

## 2022-10-07 NOTE — Assessment & Plan Note (Signed)
Well controlled. Continue current regimen. Follow up in  6 mo

## 2022-10-07 NOTE — Assessment & Plan Note (Addendum)
Showed her how to use the Anoro device. She was having difficulty and just was not sure how well she was doing it.  She thinks she may have wasted some of medication.  She does have a nebulizer machine at home and does have the medication she just does not have the actual tubing she is using her mom's old machine but it does work well.  Gave her the tubing that she used today after we gave her a neb treatment.  She still has significant wheezing after the nebulizer treatment.  Will get chest x-ray today.  She might even need a CAT scan but will start with a chest x-ray I am concerned that she is already had 2 rounds of antibiotics and steroids and really is only about 50% improved.  She still has significant rhonchi and wheezing on exam particularly in the upper lobes bilaterally.

## 2022-10-08 MED ORDER — PREDNISONE 20 MG PO TABS: 40.0000 mg | ORAL_TABLET | Freq: Every day | ORAL | 0 refills | Status: DC

## 2022-10-08 NOTE — Progress Notes (Signed)
Orders Placed This Encounter     predniSONE (DELTASONE) 20 MG tablet         Sig: Take 2 tablets (40 mg total) by mouth daily with breakfast.         Dispense:  10 tablet         Refill:  0

## 2022-10-30 ENCOUNTER — Other Ambulatory Visit: Payer: Self-pay | Admitting: Family Medicine

## 2022-10-30 DIAGNOSIS — I10 Essential (primary) hypertension: Secondary | ICD-10-CM

## 2022-10-31 ENCOUNTER — Other Ambulatory Visit (INDEPENDENT_AMBULATORY_CARE_PROVIDER_SITE_OTHER): Payer: Medicare Other

## 2022-10-31 ENCOUNTER — Ambulatory Visit (INDEPENDENT_AMBULATORY_CARE_PROVIDER_SITE_OTHER): Payer: Medicare Other | Admitting: Sports Medicine

## 2022-10-31 ENCOUNTER — Encounter: Payer: Self-pay | Admitting: Sports Medicine

## 2022-10-31 DIAGNOSIS — M19271 Secondary osteoarthritis, right ankle and foot: Secondary | ICD-10-CM

## 2022-10-31 DIAGNOSIS — M1612 Unilateral primary osteoarthritis, left hip: Secondary | ICD-10-CM | POA: Diagnosis not present

## 2022-10-31 MED ORDER — TRAMADOL HCL 50 MG PO TABS
50.0000 mg | ORAL_TABLET | Freq: Three times a day (TID) | ORAL | 0 refills | Status: DC | PRN
Start: 2022-10-31 — End: 2023-07-16

## 2022-10-31 MED ORDER — TRIAMCINOLONE ACETONIDE 40 MG/ML IJ SUSP
40.0000 mg | Freq: Once | INTRAMUSCULAR | Status: AC
Start: 2022-10-31 — End: 2022-10-31
  Administered 2022-10-31: 40 mg via INTRAMUSCULAR

## 2022-10-31 NOTE — Progress Notes (Signed)
    Procedures performed today:    Procedure: Real-time Ultrasound Guided injection of the right ankle joint Device: Samsung HS60  Verbal informed consent obtained.  Time-out conducted.  Noted no overlying erythema, induration, or other signs of local infection.  Skin prepped in a sterile fashion.  Local anesthesia: Topical Ethyl chloride.  With sterile technique and under real time ultrasound guidance: Arthritic joint noted, 1 cc kenalog 40, 1 cc lidocaine, 1 cc bupivacaine injected easily.   Completed without difficulty  Advised to call if fevers/chills, erythema, induration, drainage, or persistent bleeding.  Images permanently stored and available for review in PACS.  Impression: Technically successful ultrasound guided injection.  Independent interpretation of notes and tests performed by another provider:   None.  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 Return to see me as needed.  Primary osteoarthritis of left hip Also with left hip osteoarthritis, x-rays did confirm. She localizes the pain laterally but internal rotation of the hip does reproduce the pain. Celebrex and home PT was effective, no longer effective, adding some tramadol, home PT, we will see her back in about 4 to 6 weeks and do a hip joint injection if not better.    ____________________________________________ Ihor Austin. Benjamin Stain, M.D., ABFM., CAQSM., AME. Primary Care and Sports Medicine [facility]  Adjunct Professor of Family Medicine  [place] [employer] [facility]  Restaurant manager, fast food

## 2022-10-31 NOTE — Assessment & Plan Note (Signed)
Also with left hip osteoarthritis, x-rays did confirm. She localizes the pain laterally but internal rotation of the hip does reproduce the pain. Celebrex and home PT was effective, no longer effective, adding some tramadol, home PT, we will see her back in about 4 to 6 weeks and do a hip joint injection if not better.

## 2022-10-31 NOTE — Assessment & Plan Note (Signed)
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 Return to see me as needed.

## 2022-11-01 ENCOUNTER — Other Ambulatory Visit: Payer: Self-pay | Admitting: Family Medicine

## 2022-11-01 DIAGNOSIS — I10 Essential (primary) hypertension: Secondary | ICD-10-CM

## 2022-11-13 ENCOUNTER — Other Ambulatory Visit: Payer: Self-pay | Admitting: Family Medicine

## 2022-12-07 ENCOUNTER — Other Ambulatory Visit: Payer: Self-pay | Admitting: Sports Medicine

## 2022-12-07 DIAGNOSIS — M1612 Unilateral primary osteoarthritis, left hip: Secondary | ICD-10-CM

## 2022-12-16 ENCOUNTER — Ambulatory Visit: Payer: Medicare Other | Admitting: Sports Medicine

## 2023-01-03 ENCOUNTER — Other Ambulatory Visit: Payer: Self-pay | Admitting: Family Medicine

## 2023-01-26 ENCOUNTER — Other Ambulatory Visit: Payer: Self-pay | Admitting: Family Medicine

## 2023-01-26 DIAGNOSIS — I10 Essential (primary) hypertension: Secondary | ICD-10-CM

## 2023-02-13 ENCOUNTER — Other Ambulatory Visit: Payer: Self-pay | Admitting: Family Medicine

## 2023-02-13 DIAGNOSIS — J441 Chronic obstructive pulmonary disease with (acute) exacerbation: Secondary | ICD-10-CM

## 2023-03-11 ENCOUNTER — Other Ambulatory Visit: Payer: Self-pay | Admitting: Sports Medicine

## 2023-03-11 DIAGNOSIS — M1612 Unilateral primary osteoarthritis, left hip: Secondary | ICD-10-CM

## 2023-03-19 ENCOUNTER — Encounter: Payer: Self-pay | Admitting: Sports Medicine

## 2023-03-19 ENCOUNTER — Ambulatory Visit (INDEPENDENT_AMBULATORY_CARE_PROVIDER_SITE_OTHER): Payer: Medicare Other | Admitting: Sports Medicine

## 2023-03-19 DIAGNOSIS — M19042 Primary osteoarthritis, left hand: Secondary | ICD-10-CM

## 2023-03-19 DIAGNOSIS — M19271 Secondary osteoarthritis, right ankle and foot: Secondary | ICD-10-CM

## 2023-03-19 DIAGNOSIS — M19071 Primary osteoarthritis, right ankle and foot: Secondary | ICD-10-CM | POA: Insufficient documentation

## 2023-03-19 MED ORDER — DICLOFENAC SODIUM 1 % EX GEL: 2.0000 g | Freq: Four times a day (QID) | CUTANEOUS | 11 refills | Status: AC

## 2023-03-19 NOTE — Assessment & Plan Note (Signed)
 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 We will start conservative with home physical therapy and topical Voltaren. Return to see me in approximately 6 weeks, we will consider Neurontin treatment if not better. I am recommending Neurontin as opposed to individual joint injections considering the diffuse nature of her discomfort.

## 2023-03-19 NOTE — Assessment & Plan Note (Signed)
 This is an exquisitely pleasant 68 year old female, she has posttraumatic ankle osteoarthritis status post ORIF of malleolar fractures, last injection was in October 2024, she continues to do well.

## 2023-03-19 NOTE — Assessment & Plan Note (Signed)
 Sherri [name] also has dorsal midfoot osteoarthritis with tarsometatarsal bossing. This is likely osteoarthritis. I would like some updated right foot x-rays. We will also add custom molded orthotics, she will apply topical Voltaren gel, home intrinsic foot exercises given. Return to see me in 4 to 6 weeks, we will consider a tarsometatarsal injection if not better. Continue Celebrex and tramadol.

## 2023-03-19 NOTE — Progress Notes (Signed)
    Procedures performed today:    None.  Independent interpretation of notes and tests performed by another provider:   None.  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  Primary osteoarthritis of right foot Sherri [name] also has dorsal midfoot osteoarthritis with tarsometatarsal bossing. This is likely osteoarthritis. I would like some updated right foot x-rays. We will also add custom molded orthotics, she will apply topical Voltaren gel, home intrinsic foot exercises given. Return to see me in 4 to 6 weeks, we will consider a tarsometatarsal injection if not better. Continue Celebrex and tramadol.  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 We will start conservative with home physical therapy and topical Voltaren. Return to see me in approximately 6 weeks, we will consider Neurontin treatment if not better. I am recommending Neurontin as opposed to individual joint injections considering the diffuse nature of her discomfort.     ____________________________________________ Ihor Austin. Benjamin Stain, M.D., ABFM., CAQSM., AME. Primary Care and Sports Medicine [facility]  Adjunct Professor of Family Medicine  [place] [employer] [facility] [employer]  Restaurant manager, fast food

## 2023-03-28 ENCOUNTER — Ambulatory Visit

## 2023-03-28 DIAGNOSIS — M19071 Primary osteoarthritis, right ankle and foot: Secondary | ICD-10-CM | POA: Diagnosis not present

## 2023-03-28 DIAGNOSIS — M79671 Pain in right foot: Secondary | ICD-10-CM | POA: Diagnosis not present

## 2023-03-28 DIAGNOSIS — M25571 Pain in right ankle and joints of right foot: Secondary | ICD-10-CM | POA: Diagnosis not present

## 2023-04-16 ENCOUNTER — Ambulatory Visit (INDEPENDENT_AMBULATORY_CARE_PROVIDER_SITE_OTHER): Admitting: Sports Medicine

## 2023-04-16 DIAGNOSIS — Q761 Klippel-Feil syndrome: Secondary | ICD-10-CM

## 2023-04-16 DIAGNOSIS — M19071 Primary osteoarthritis, right ankle and foot: Secondary | ICD-10-CM

## 2023-04-16 NOTE — Progress Notes (Signed)
    Procedures performed today:    None.  Independent interpretation of notes and tests performed by another provider:   None.  Brief History, Exam, Impression, and Recommendations:    Cervical fusion syndrome Pleasant 68 year old female, she is status post C4-C6 ACDF by Dr. Dutch Quint in the past, adjacent level disease C6-T1, she is having increasing discomfort right arm radiating down to the hand from the shoulder. Suspect more of a radicular process. Historically she has done well with C6-C7 interlaminar epidurals per Today we are going to start conservative, we will bump her gabapentin up to 1200 mg in the morning and 1800 mg in the evening. I would like updated x-rays, we will do some home PT and proceed with MRI and epidurals if not better at the follow-up.  Primary osteoarthritis of right foot Some improvement with conservative treatment, still has moderate midfoot pain, x-rays did show midfoot osteoarthritis. We will start with gabapentin if insufficient improvement after 4 to 6 weeks we will consider a tarsometatarsal injection.    ____________________________________________ Ihor Austin. Benjamin Stain, M.D., ABFM., CAQSM., AME. Primary Care and Sports Medicine [facility]  Adjunct Professor of Family Medicine  [place] [employer] [facility] [employer]  Restaurant manager, fast food

## 2023-04-16 NOTE — Assessment & Plan Note (Signed)
 Some improvement with conservative treatment, still has moderate midfoot pain, x-rays did show midfoot osteoarthritis. We will start with gabapentin if insufficient improvement after 4 to 6 weeks we will consider a tarsometatarsal injection.

## 2023-04-16 NOTE — Assessment & Plan Note (Signed)
 Pleasant 68 year old female, she is status post C4-C6 ACDF by Dr. Dutch Quint in the past, adjacent level disease C6-T1, she is having increasing discomfort right arm radiating down to the hand from the shoulder. Suspect more of a radicular process. Historically she has done well with C6-C7 interlaminar epidurals per Today we are going to start conservative, we will bump her gabapentin up to 1200 mg in the morning and 1800 mg in the evening. I would like updated x-rays, we will do some home PT and proceed with MRI and epidurals if not better at the follow-up.

## 2023-04-30 ENCOUNTER — Telehealth: Payer: Self-pay

## 2023-04-30 DIAGNOSIS — Z1231 Encounter for screening mammogram for malignant neoplasm of breast: Secondary | ICD-10-CM

## 2023-04-30 DIAGNOSIS — Z13820 Encounter for screening for osteoporosis: Secondary | ICD-10-CM

## 2023-04-30 NOTE — Telephone Encounter (Signed)
 Ordered mammogram and bone density. Patient agreed to go.

## 2023-04-30 NOTE — Progress Notes (Signed)
 [facility] Quality Team Note  Name: Sherri [name] Date of Birth: 1955/05/23 MRN: [id number] Date: 04/30/2023  [facility] Quality Team has reviewed this patient's chart, please see recommendations below:  [facility] Quality Other; (PATIENT NEEDS BREAST CANCER SCREENING. PLEASE ADDRESS AT UPCOMING APPT 05/28/2023)

## 2023-05-01 ENCOUNTER — Other Ambulatory Visit: Payer: Self-pay | Admitting: Family Medicine

## 2023-05-01 DIAGNOSIS — I10 Essential (primary) hypertension: Secondary | ICD-10-CM

## 2023-05-10 ENCOUNTER — Other Ambulatory Visit: Payer: Self-pay | Admitting: Family Medicine

## 2023-05-10 DIAGNOSIS — I10 Essential (primary) hypertension: Secondary | ICD-10-CM

## 2023-05-17 ENCOUNTER — Other Ambulatory Visit: Payer: Self-pay | Admitting: Family Medicine

## 2023-05-17 DIAGNOSIS — J302 Other seasonal allergic rhinitis: Secondary | ICD-10-CM

## 2023-05-17 DIAGNOSIS — J439 Emphysema, unspecified: Secondary | ICD-10-CM

## 2023-05-21 ENCOUNTER — Encounter (HOSPITAL_COMMUNITY): Payer: Self-pay

## 2023-05-22 ENCOUNTER — Encounter (HOSPITAL_COMMUNITY): Payer: Self-pay

## 2023-05-28 ENCOUNTER — Ambulatory Visit: Admitting: Family Medicine

## 2023-05-28 ENCOUNTER — Ambulatory Visit

## 2023-05-28 DIAGNOSIS — M810 Age-related osteoporosis without current pathological fracture: Secondary | ICD-10-CM | POA: Diagnosis not present

## 2023-05-28 DIAGNOSIS — Z1231 Encounter for screening mammogram for malignant neoplasm of breast: Secondary | ICD-10-CM | POA: Diagnosis not present

## 2023-05-28 DIAGNOSIS — Z780 Asymptomatic menopausal state: Secondary | ICD-10-CM | POA: Diagnosis not present

## 2023-05-28 DIAGNOSIS — Z13820 Encounter for screening for osteoporosis: Secondary | ICD-10-CM | POA: Diagnosis not present

## 2023-05-29 ENCOUNTER — Ambulatory Visit: Payer: Self-pay | Admitting: Family Medicine

## 2023-05-29 ENCOUNTER — Ambulatory Visit: Admitting: Sports Medicine

## 2023-05-29 NOTE — Progress Notes (Signed)
 Hi Sherri [name], your bone density shows T score of -2.5  which is from osteoporosis.     The current recommendation for osteoporosis treatment includes:   #1 calcium -total of 1200 mg of calcium  daily.  If you eat a very calcium  rich diet you may be able to obtain that without a supplement.  If not, then I recommend calcium  500 mg twice a day.  There are several products over-the-counter such as Caltrate D and Viactiv chews which are great options that contain calcium  and vitamin D. #2 vitamin D-recommend 800 international units daily. #3 exercise-recommend 30 minutes of weightbearing exercise 3 days a week.  Resistance training ,such as doing bands and light weights, can be particularly helpful. #4 medication-if you are not currently on a bone builder, also called a bisphosphonate, then this has been shown to be very helpful in maintaining bone strength, preventing further thinning of the bones, and reducing your risk for fractures.  I would highly recommend that you consider starting 1 of these medications.  If you are okay with that then please let us  know and we will send one to your pharmacy.  If you would like to discuss further we are happy to make an appointment for you so that we can go over options for treatment.

## 2023-05-30 NOTE — Progress Notes (Signed)
 Please call patient. Normal mammogram.  Repeat in 1 year.

## 2023-06-06 ENCOUNTER — Ambulatory Visit (INDEPENDENT_AMBULATORY_CARE_PROVIDER_SITE_OTHER): Admitting: Sports Medicine

## 2023-06-06 ENCOUNTER — Ambulatory Visit

## 2023-06-06 DIAGNOSIS — Q761 Klippel-Feil syndrome: Secondary | ICD-10-CM

## 2023-06-06 DIAGNOSIS — M5030 Other cervical disc degeneration, unspecified cervical region: Secondary | ICD-10-CM

## 2023-06-06 DIAGNOSIS — M65342 Trigger finger, left ring finger: Secondary | ICD-10-CM | POA: Diagnosis not present

## 2023-06-06 DIAGNOSIS — M4802 Spinal stenosis, cervical region: Secondary | ICD-10-CM | POA: Diagnosis not present

## 2023-06-06 DIAGNOSIS — M47812 Spondylosis without myelopathy or radiculopathy, cervical region: Secondary | ICD-10-CM | POA: Diagnosis not present

## 2023-06-06 DIAGNOSIS — Z981 Arthrodesis status: Secondary | ICD-10-CM | POA: Diagnosis not present

## 2023-06-06 NOTE — Progress Notes (Signed)
    Procedures performed today:    None.  Independent interpretation of notes and tests performed by another provider:   None.  Brief History, Exam, Impression, and Recommendations:    Cervical fusion syndrome Sherri [name] is a very pleasant 68 year old female, she is status post C4-C6 ACDF by Dr. Gwendlyn Lemmings in the past, she has known adjacent level disease C6-T1, we last saw her in April, she was having some right-sided radiculopathy. We started conservative by bumping up her gabapentin  to 1200 mg in the morning and 800 mg in the evening, she never got the x-rays, she will get them done today, we added some home PT. She is doing okay, she still has some discomfort and would like to go ahead and consider an epidural, I will go ahead and order her cervical epidural. Return to see me 6 weeks later.  Trigger finger, left History of left trigger thumb, this responded well to an injection back in July of last year. Now having some left ring finger triggering, we will start conservatively with home conditioning to stretch the canal and tendon sheath, if this fails after 6 weeks we can do a left ring finger flexor tendon sheath injection at the follow-up.    ____________________________________________ [name]. Sandy Crumb, M.D., ABFM., CAQSM., AME. Primary Care and Sports Medicine [facility]  Adjunct Professor of [facility]  [employer]  Restaurant manager, fast food

## 2023-06-06 NOTE — Assessment & Plan Note (Signed)
 Sherri [name] is a very pleasant 68 year old female, she is status post C4-C6 ACDF by Dr. Gwendlyn Lemmings in the past, she has known adjacent level disease C6-T1, we last saw her in April, she was having some right-sided radiculopathy. We started conservative by bumping up her gabapentin  to 1200 mg in the morning and 800 mg in the evening, she never got the x-rays, she will get them done today, we added some home PT. She is doing okay, she still has some discomfort and would like to go ahead and consider an epidural, I will go ahead and order her cervical epidural. Return to see me 6 weeks later.

## 2023-06-06 NOTE — Assessment & Plan Note (Signed)
 History of left trigger thumb, this responded well to an injection back in July of last year. Now having some left ring finger triggering, we will start conservatively with home conditioning to stretch the canal and tendon sheath, if this fails after 6 weeks we can do a left ring finger flexor tendon sheath injection at the follow-up.

## 2023-06-17 ENCOUNTER — Other Ambulatory Visit: Payer: Self-pay | Admitting: Family Medicine

## 2023-06-17 DIAGNOSIS — Q761 Klippel-Feil syndrome: Secondary | ICD-10-CM

## 2023-06-17 MED ORDER — GABAPENTIN 600 MG PO TABS: ORAL_TABLET | ORAL | 3 refills | Status: AC

## 2023-06-17 NOTE — Telephone Encounter (Signed)
 Copied from CRM [id number]. Topic: Clinical - Prescription Issue >> Jun 17, 2023  9:47 AM Wynona Hedger wrote: Reason for CRM: Pharmacy needs Dr. Elva Hamburger to send in a new prescription for Neurontin . Patient states she only has 2 doses left

## 2023-06-25 NOTE — Discharge Instructions (Signed)

## 2023-06-27 ENCOUNTER — Ambulatory Visit
Admission: RE | Admit: 2023-06-27 | Discharge: 2023-06-27 | Disposition: A | Discharge status: Home / Self Care | Source: Ambulatory Visit | Attending: Sports Medicine | Admitting: Sports Medicine

## 2023-06-27 DIAGNOSIS — M4722 Other spondylosis with radiculopathy, cervical region: Secondary | ICD-10-CM | POA: Diagnosis not present

## 2023-06-27 DIAGNOSIS — Q761 Klippel-Feil syndrome: Secondary | ICD-10-CM

## 2023-06-27 MED ORDER — IOPAMIDOL (ISOVUE-M 300) INJECTION 61%
1.0000 mL | Freq: Once | INTRAMUSCULAR | Status: AC
  Administered 2023-06-27: 1 mL via EPIDURAL

## 2023-06-27 MED ORDER — TRIAMCINOLONE ACETONIDE 40 MG/ML IJ SUSP (RADIOLOGY)
60.0000 mg | Freq: Once | INTRAMUSCULAR | Status: AC
  Administered 2023-06-27: 60 mg via EPIDURAL

## 2023-07-11 ENCOUNTER — Other Ambulatory Visit: Payer: Self-pay

## 2023-07-11 ENCOUNTER — Emergency Department (HOSPITAL_BASED_OUTPATIENT_CLINIC_OR_DEPARTMENT_OTHER)

## 2023-07-11 ENCOUNTER — Encounter (HOSPITAL_BASED_OUTPATIENT_CLINIC_OR_DEPARTMENT_OTHER): Payer: Self-pay

## 2023-07-11 ENCOUNTER — Ambulatory Visit: Payer: Self-pay

## 2023-07-11 ENCOUNTER — Inpatient Hospital Stay (HOSPITAL_BASED_OUTPATIENT_CLINIC_OR_DEPARTMENT_OTHER)
Admission: EM | Admit: 2023-07-11 | Discharge: 2023-07-14 | DRG: 190 | Disposition: A | Discharge status: Home / Self Care | Source: Ambulatory Visit | Attending: Internal Medicine | Admitting: Internal Medicine

## 2023-07-11 ENCOUNTER — Ambulatory Visit: Admission: EM | Admit: 2023-07-11 | Discharge: 2023-07-11 | Disposition: A | Discharge status: Home / Self Care

## 2023-07-11 DIAGNOSIS — R918 Other nonspecific abnormal finding of lung field: Secondary | ICD-10-CM | POA: Diagnosis not present

## 2023-07-11 DIAGNOSIS — F17210 Nicotine dependence, cigarettes, uncomplicated: Secondary | ICD-10-CM | POA: Diagnosis present

## 2023-07-11 DIAGNOSIS — Z79899 Other long term (current) drug therapy: Secondary | ICD-10-CM

## 2023-07-11 DIAGNOSIS — J441 Chronic obstructive pulmonary disease with (acute) exacerbation: Secondary | ICD-10-CM | POA: Diagnosis not present

## 2023-07-11 DIAGNOSIS — I10 Essential (primary) hypertension: Secondary | ICD-10-CM | POA: Diagnosis not present

## 2023-07-11 DIAGNOSIS — R0602 Shortness of breath: Secondary | ICD-10-CM

## 2023-07-11 DIAGNOSIS — J9601 Acute respiratory failure with hypoxia: Secondary | ICD-10-CM | POA: Diagnosis not present

## 2023-07-11 DIAGNOSIS — E785 Hyperlipidemia, unspecified: Secondary | ICD-10-CM | POA: Diagnosis not present

## 2023-07-11 DIAGNOSIS — Z880 Allergy status to penicillin: Secondary | ICD-10-CM | POA: Diagnosis not present

## 2023-07-11 DIAGNOSIS — Z720 Tobacco use: Secondary | ICD-10-CM | POA: Diagnosis present

## 2023-07-11 DIAGNOSIS — Z800 Family history of malignant neoplasm of digestive organs: Secondary | ICD-10-CM | POA: Diagnosis not present

## 2023-07-11 DIAGNOSIS — Z818 Family history of other mental and behavioral disorders: Secondary | ICD-10-CM

## 2023-07-11 DIAGNOSIS — Z8249 Family history of ischemic heart disease and other diseases of the circulatory system: Secondary | ICD-10-CM | POA: Diagnosis not present

## 2023-07-11 DIAGNOSIS — G44209 Tension-type headache, unspecified, not intractable: Secondary | ICD-10-CM | POA: Diagnosis not present

## 2023-07-11 DIAGNOSIS — Z1152 Encounter for screening for COVID-19: Secondary | ICD-10-CM | POA: Diagnosis not present

## 2023-07-11 DIAGNOSIS — Z833 Family history of diabetes mellitus: Secondary | ICD-10-CM

## 2023-07-11 DIAGNOSIS — F32A Depression, unspecified: Secondary | ICD-10-CM | POA: Diagnosis present

## 2023-07-11 DIAGNOSIS — F411 Generalized anxiety disorder: Secondary | ICD-10-CM | POA: Diagnosis present

## 2023-07-11 DIAGNOSIS — E782 Mixed hyperlipidemia: Secondary | ICD-10-CM | POA: Diagnosis not present

## 2023-07-11 DIAGNOSIS — Z885 Allergy status to narcotic agent status: Secondary | ICD-10-CM

## 2023-07-11 DIAGNOSIS — Z820 Family history of epilepsy and other diseases of the nervous system: Secondary | ICD-10-CM

## 2023-07-11 DIAGNOSIS — Z7951 Long term (current) use of inhaled steroids: Secondary | ICD-10-CM | POA: Diagnosis not present

## 2023-07-11 DIAGNOSIS — R058 Other specified cough: Secondary | ICD-10-CM | POA: Diagnosis not present

## 2023-07-11 HISTORY — DX: Hyperlipidemia, unspecified: E78.5

## 2023-07-11 HISTORY — DX: Essential (primary) hypertension: I10

## 2023-07-11 LAB — BASIC METABOLIC PANEL WITH GFR
Anion gap: 13 (ref 5–15)
BUN: 14 mg/dL (ref 8–23)
CO2: 29 mmol/L (ref 22–32)
Calcium: 9.8 mg/dL (ref 8.9–10.3)
Chloride: 95 mmol/L — ABNORMAL LOW (ref 98–111)
Creatinine, Ser: 0.92 mg/dL (ref 0.44–1.00)
GFR, Estimated: >60 mL/min (ref >60)
Glucose, Bld: 93 mg/dL (ref 70–99)
Potassium: 3.8 mmol/L (ref 3.5–5.1)
Sodium: 137 mmol/L (ref 135–145)

## 2023-07-11 LAB — CBC
HCT: 40.6 % (ref 36.0–46.0)
Hemoglobin: 13.9 g/dL (ref 12.0–15.0)
MCH: 34.3 pg — ABNORMAL HIGH (ref 26.0–34.0)
MCHC: 34.2 g/dL (ref 30.0–36.0)
MCV: 100.2 fL — ABNORMAL HIGH (ref 80.0–100.0)
Platelets: 268 10*3/uL (ref 150–400)
RBC: 4.05 MIL/uL (ref 3.87–5.11)
RDW: 12.9 % (ref 11.5–15.5)
WBC: 9.8 10*3/uL (ref 4.0–10.5)
nRBC: 0 % (ref 0.0–0.2)

## 2023-07-11 LAB — RESP PANEL BY RT-PCR (RSV, FLU A&B, COVID)  RVPGX2
Influenza A by PCR: NEGATIVE
Influenza B by PCR: NEGATIVE
Resp Syncytial Virus by PCR: NEGATIVE
SARS Coronavirus 2 by RT PCR: NEGATIVE

## 2023-07-11 MED ORDER — FLUOXETINE HCL 20 MG PO CAPS
60.0000 mg | ORAL_CAPSULE | Freq: Every day | ORAL | Status: DC
  Administered 2023-07-12 – 2023-07-14 (×3): 60 mg via ORAL
  Filled 2023-07-11 (×3): qty 3

## 2023-07-11 MED ORDER — MELATONIN 3 MG PO TABS
3.0000 mg | ORAL_TABLET | Freq: Every evening | ORAL | Status: DC | PRN
  Administered 2023-07-13: 3 mg via ORAL
  Filled 2023-07-11: qty 1

## 2023-07-11 MED ORDER — METHYLPREDNISOLONE SODIUM SUCC 125 MG IJ SOLR
80.0000 mg | Freq: Two times a day (BID) | INTRAMUSCULAR | Status: DC
  Administered 2023-07-12 – 2023-07-14 (×5): 80 mg via INTRAVENOUS
  Filled 2023-07-11 (×5): qty 2

## 2023-07-11 MED ORDER — KETOROLAC TROMETHAMINE 15 MG/ML IJ SOLN
15.0000 mg | Freq: Four times a day (QID) | INTRAMUSCULAR | Status: DC | PRN
  Administered 2023-07-12: 15 mg via INTRAVENOUS
  Filled 2023-07-11: qty 1

## 2023-07-11 MED ORDER — ACETAMINOPHEN 650 MG RE SUPP: 650.0000 mg | Freq: Four times a day (QID) | RECTAL | Status: DC | PRN

## 2023-07-11 MED ORDER — ALBUTEROL SULFATE (2.5 MG/3ML) 0.083% IN NEBU
5.0000 mg | INHALATION_SOLUTION | Freq: Once | RESPIRATORY_TRACT | Status: AC
  Administered 2023-07-11: 5 mg via RESPIRATORY_TRACT
  Filled 2023-07-11: qty 6

## 2023-07-11 MED ORDER — IPRATROPIUM-ALBUTEROL 0.5-2.5 (3) MG/3ML IN SOLN
3.0000 mL | Freq: Once | RESPIRATORY_TRACT | Status: AC
  Administered 2023-07-11: 3 mL via RESPIRATORY_TRACT
  Filled 2023-07-11: qty 3

## 2023-07-11 MED ORDER — METHYLPREDNISOLONE SODIUM SUCC 125 MG IJ SOLR
125.0000 mg | Freq: Once | INTRAMUSCULAR | Status: AC
  Administered 2023-07-11: 125 mg via INTRAVENOUS
  Filled 2023-07-11: qty 2

## 2023-07-11 MED ORDER — SODIUM CHLORIDE 0.9 % IV SOLN
500.0000 mg | INTRAVENOUS | Status: DC
  Administered 2023-07-11 – 2023-07-13 (×3): 500 mg via INTRAVENOUS
  Filled 2023-07-11 (×4): qty 5

## 2023-07-11 MED ORDER — ONDANSETRON HCL 4 MG/2ML IJ SOLN: 4.0000 mg | Freq: Four times a day (QID) | INTRAMUSCULAR | Status: DC | PRN

## 2023-07-11 MED ORDER — IPRATROPIUM-ALBUTEROL 0.5-2.5 (3) MG/3ML IN SOLN
3.0000 mL | Freq: Four times a day (QID) | RESPIRATORY_TRACT | Status: DC
  Administered 2023-07-11 – 2023-07-13 (×7): 3 mL via RESPIRATORY_TRACT
  Filled 2023-07-11 (×8): qty 3

## 2023-07-11 MED ORDER — NICOTINE 14 MG/24HR TD PT24: 14.0000 mg | MEDICATED_PATCH | Freq: Every day | TRANSDERMAL | Status: DC | PRN

## 2023-07-11 MED ORDER — ALBUTEROL SULFATE (2.5 MG/3ML) 0.083% IN NEBU: 2.5000 mg | INHALATION_SOLUTION | RESPIRATORY_TRACT | Status: DC | PRN

## 2023-07-11 MED ORDER — ALBUTEROL SULFATE (2.5 MG/3ML) 0.083% IN NEBU
INHALATION_SOLUTION | RESPIRATORY_TRACT | Status: AC
  Administered 2023-07-11: 2.5 mg
  Filled 2023-07-11: qty 3

## 2023-07-11 MED ORDER — AMLODIPINE BESYLATE 5 MG PO TABS
2.5000 mg | ORAL_TABLET | Freq: Every day | ORAL | Status: DC
  Administered 2023-07-12 – 2023-07-14 (×3): 2.5 mg via ORAL
  Filled 2023-07-11 (×3): qty 1

## 2023-07-11 MED ORDER — MONTELUKAST SODIUM 10 MG PO TABS
10.0000 mg | ORAL_TABLET | Freq: Every day | ORAL | Status: DC
  Administered 2023-07-11 – 2023-07-13 (×3): 10 mg via ORAL
  Filled 2023-07-11 (×3): qty 1

## 2023-07-11 MED ORDER — LOSARTAN POTASSIUM 50 MG PO TABS
100.0000 mg | ORAL_TABLET | Freq: Every day | ORAL | Status: DC
  Administered 2023-07-12 – 2023-07-14 (×3): 100 mg via ORAL
  Filled 2023-07-11 (×3): qty 2

## 2023-07-11 MED ORDER — ACETAMINOPHEN 325 MG PO TABS: 650.0000 mg | ORAL_TABLET | Freq: Four times a day (QID) | ORAL | Status: DC | PRN

## 2023-07-11 MED ORDER — IPRATROPIUM-ALBUTEROL 0.5-2.5 (3) MG/3ML IN SOLN
RESPIRATORY_TRACT | Status: AC
  Administered 2023-07-11: 3 mL
  Filled 2023-07-11: qty 3

## 2023-07-11 MED ORDER — BUTALBITAL-APAP-CAFFEINE 50-325-40 MG PO TABS
1.0000 | ORAL_TABLET | Freq: Four times a day (QID) | ORAL | Status: DC | PRN
  Administered 2023-07-11 – 2023-07-13 (×3): 1 via ORAL
  Filled 2023-07-11 (×3): qty 1

## 2023-07-11 MED ORDER — ATORVASTATIN CALCIUM 40 MG PO TABS
40.0000 mg | ORAL_TABLET | Freq: Every day | ORAL | Status: DC
  Administered 2023-07-11 – 2023-07-13 (×3): 40 mg via ORAL
  Filled 2023-07-11 (×3): qty 1

## 2023-07-11 MED ORDER — ACETAMINOPHEN 500 MG PO TABS
1000.0000 mg | ORAL_TABLET | Freq: Once | ORAL | Status: AC
  Administered 2023-07-11: 1000 mg via ORAL
  Filled 2023-07-11: qty 2

## 2023-07-11 NOTE — Telephone Encounter (Signed)
 FYI - the patient has been scheduled with NP Joy on 07/14/23 at 1010 am for the following symptoms.

## 2023-07-11 NOTE — Telephone Encounter (Signed)
 FYI Only or Action Required?: FYI only for provider.  Patient was last seen in primary care on 06/06/2023 by Curtis Debby PARAS, MD. Called Nurse Triage reporting Shortness of Breath. Symptoms began several days ago. Interventions attempted: Nothing. Symptoms are: rapidly worsening.  Triage Disposition: See HCP Within 4 Hours (Or PCP Triage) to ER; no apt available until Monday  Patient/caregiver understands and will follow disposition?: Yes     Copied from CRM [id number]. Topic: Clinical - Red Word Triage >> Jul 11, 2023  9:22 AM Miquel SAILOR wrote: Red Word that prompted transfer to Nurse Triage: Shortness of breath for last 3 days getting worse. May have upper respiratory infection Reason for Disposition  [1] Longstanding difficulty breathing (e.g., CHF, COPD, emphysema) AND [2] WORSE than normal  Answer Assessment - Initial Assessment Questions 1. RESPIRATORY STATUS: Describe your breathing? (e.g., wheezing, shortness of breath, unable to speak, severe coughing)      Sob, coughing,  2. ONSET: When did this breathing problem begin?      Three days ago 3. PATTERN Does the difficult breathing come and go, or has it been constant since it started?      constant 4. SEVERITY: How bad is your breathing? (e.g., mild, moderate, severe)    - MILD: No SOB at rest, mild SOB with walking, speaks normally in sentences, can lie down, no retractions, pulse < 100.    - MODERATE: SOB at rest, SOB with minimal exertion and prefers to sit, cannot lie down flat, speaks in phrases, mild retractions, audible wheezing, pulse 100-120.    - SEVERE: Very SOB at rest, speaks in single words, struggling to breathe, sitting hunched forward, retractions, pulse > 120      moderate 5. RECURRENT SYMPTOM: Have you had difficulty breathing before? If Yes, ask: When was the last time? and What happened that time?      yes 6. CARDIAC HISTORY: Do you have any history of heart disease? (e.g., heart attack,  angina, bypass surgery, angioplasty)      no 7. LUNG HISTORY: Do you have any history of lung disease?  (e.g., pulmonary embolus, asthma, emphysema)     copd 8. CAUSE: What do you think is causing the breathing problem?      copd 9. OTHER SYMPTOMS: Do you have any other symptoms? (e.g., dizziness, runny nose, cough, chest pain, fever)     Runny nose 10. O2 SATURATION MONITOR:  Do you use an oxygen saturation monitor (pulse oximeter) at home? If Yes, ask: What is your reading (oxygen level) today? What is your usual oxygen saturation reading? (e.g., 95%)       no 11. PREGNANCY: Is there any chance you are pregnant? When was your last menstrual period?       na 12. TRAVEL: Have you traveled out of the country in the last month? (e.g., travel history, exposures)       no  Protocols used: Breathing Difficulty-A-AH

## 2023-07-11 NOTE — H&P (Signed)
 History and Physical      Sherri [name] FMW:[id number] DOB: 10/14/1955 DOA: 07/11/2023; DOS: 07/11/2023  PCP: [name], MD *** Patient coming from: home ***  I have personally briefly reviewed patient's old medical records in [facility]  Chief Complaint: ***  HPI: Sherri [name] is a 68 y.o. female with medical history significant for *** who is admitted to [facility] on 07/11/2023 with *** after presenting from home*** to [facility] ED complaining of ***.   ***        ***  ED Course:  Vital signs in the ED were notable for the following: ***  Labs were notable for the following: ***  Per my interpretation, EKG in ED demonstrated the following:  ***  Imaging in the ED, per corresponding formal radiology read, was notable for the following: ***  While in the ED, the following were administered: ***  Subsequently, the patient was admitted  ***  ***red   Review of Systems: As per HPI otherwise 10 point review of systems negative.   Past Medical History:  Diagnosis Date   Anxiety    Arthritis    Asthma    COPD (chronic obstructive pulmonary disease) (HCC)    Depression    Pneumonia    Recurrent upper respiratory infection (URI)     Past Surgical History:  Procedure Laterality Date   ankle sx  1973   APPENDECTOMY     BREAST REDUCTION SURGERY  06/24/2011   Procedure: MAMMARY REDUCTION  (BREAST);  Surgeon: Elna Pick, MD;  Location: [facility];  Service: Plastics;  Laterality: Bilateral;   CERVICAL DISC SURGERY     (? laminectomy?)   CESAREAN SECTION  1976   left groin hernia  1974   REDUCTION MAMMAPLASTY     TONSILLECTOMY      Social History:  reports that she has been smoking cigarettes. She has a 17.5 pack-year smoking history. She uses smokeless tobacco. She reports that she does not drink alcohol and does not use drugs.   Allergies  Allergen Reactions   Hydrocodone  Nausea And Vomiting   Penicillins  Hives    REACTION: hives    Family History  Problem Relation Age of Onset   Hypertension Mother    Depression Mother    Alzheimer's disease Mother    Diverticulitis Mother    Alcohol abuse Father    Diabetes Father    Cancer Other        pancreatic   Pancreatic cancer Maternal Grandmother    Colon cancer Neg Hx    Colon polyps Neg Hx    Esophageal cancer Neg Hx    Rectal cancer Neg Hx    Stomach cancer Neg Hx     Family history reviewed and not pertinent ***   Prior to Admission medications   Medication Sig Start Date End Date Taking? Authorizing Provider  albuterol  (VENTOLIN  HFA) 108 (90 Base) MCG/ACT inhaler TAKE 2 PUFFS BY MOUTH EVERY 6 HOURS AS NEEDED FOR WHEEZE OR SHORTNESS OF BREATH 09/06/22   Bevin Asal S, DO  amLODipine  (NORVASC ) 2.5 MG tablet TAKE 1 TABLET BY MOUTH EVERY DAY 05/01/23   Metheney, Catherine D, MD  atorvastatin  (LIPITOR) 40 MG tablet TAKE 1 TABLET BY MOUTH EVERYDAY AT BEDTIME 05/20/23   [name], MD  celecoxib  (CELEBREX ) 200 MG capsule ONE TO 2 TABLETS BY MOUTH DAILY AS NEEDED FOR PAIN. 03/12/23   Curtis Debby PARAS, MD  diclofenac  Sodium (VOLTAREN ) 1 %  GEL Apply 2 g topically 4 (four) times daily. To affected joint. 03/19/23   Curtis Debby PARAS, MD  FLUoxetine  (PROZAC ) 20 MG capsule TAKE 3 CAPSULES BY MOUTH EVERY DAY 01/03/23   [name], MD  gabapentin  (NEURONTIN ) 600 MG tablet 1200 mg in the morning, 1800 mg in the evening 06/17/23   Thekkekandam, Thomas J, MD  ipratropium-albuterol  (DUONEB) 0.5-2.5 (3) MG/3ML SOLN INHALE 3 ML BY NEBULIZER EVERY 6 HOURS AS NEEDED 02/14/23   [name], MD  losartan -hydrochlorothiazide  (HYZAAR) 100-25 MG tablet TAKE 1 TABLET BY MOUTH EVERY DAY 05/12/23   [name], MD  montelukast  (SINGULAIR ) 10 MG tablet TAKE 1 TABLET BY MOUTH EVERYDAY AT BEDTIME 05/20/23   [name], MD  Respiratory Therapy Supplies (FULL KIT NEBULIZER SET) MISC Nebulizer machine of patient's  choice, please include tubes and hoses as needed.  Use as directed. 09/11/22   Bevin Bernice RAMAN, DO  Respiratory Therapy Supplies (NEBULIZER MASK ADULT) MISC 1 Device by Does not apply route as needed. 09/06/22   Bevin Bernice RAMAN, DO  traMADol  (ULTRAM ) 50 MG tablet Take 1 tablet (50 mg total) by mouth every 8 (eight) hours as needed for moderate pain (pain score 4-6). 10/31/22   Curtis Debby PARAS, MD  umeclidinium-vilanterol (ANORO ELLIPTA ) 62.5-25 MCG/ACT AEPB Inhale 1 puff into the lungs daily at 6 (six) AM. 03/19/22   [name], MD     Objective    Physical Exam: Vitals:   07/11/23 1645 07/11/23 1808 07/11/23 1812 07/11/23 1817  BP: 130/69  123/65 123/65  Pulse: 95  72 72  Resp: 20  18 18   Temp:   98.3 F (36.8 C) 98.3 F (36.8 C)  TempSrc:   Oral Oral  SpO2: 96%   96%  Weight:  70.9 kg    Height:  5' 5 (1.651 m)      General: appears to be stated age; alert, oriented Skin: warm, dry, no rash Head:  AT/[place] Mouth:  Oral mucosa membranes appear moist, normal dentition Neck: supple; trachea midline Heart:  RRR; did not appreciate any M/R/G Lungs: CTAB, did not appreciate any wheezes, rales, or rhonchi Abdomen: + BS; soft, ND, NT Vascular: 2+ pedal pulses b/l; 2+ radial pulses b/l Extremities: no peripheral edema, no muscle wasting Neuro: strength and sensation intact in upper and lower extremities b/l    *** Neuro: 5/5 strength of the proximal and distal flexors and extensors of the upper and lower extremities bilaterally; sensation intact in upper and lower extremities b/l; cranial nerves II through XII grossly intact; no pronator drift; no evidence suggestive of slurred speech, dysarthria, or facial droop; Normal muscle tone. No tremors. *** Neuro: In the setting of the patient's current mental status and associated inability to follow instructions, unable to perform full neurologic exam at this time.  As such, assessment of strength, sensation, and cranial nerves  is limited at this time. Patient noted to spontaneously move all 4 extremities. No tremors.  ***    Labs on Admission: I have personally reviewed following labs and imaging studies  CBC: Recent Labs  Lab 07/11/23 1414  WBC 9.8  HGB 13.9  HCT 40.6  MCV 100.2*  PLT 268   Basic Metabolic Panel: Recent Labs  Lab 07/11/23 1414  NA 137  K 3.8  CL 95*  CO2 29  GLUCOSE 93  BUN 14  CREATININE 0.92  CALCIUM  9.8   GFR: Estimated Creatinine Clearance: 57 mL/min (by C-G formula based on SCr  of 0.92 mg/dL). Liver Function Tests: No results for input(s): AST, ALT, ALKPHOS, BILITOT, PROT, ALBUMIN in the last 168 hours. No results for input(s): LIPASE, AMYLASE in the last 168 hours. No results for input(s): AMMONIA in the last 168 hours. Coagulation Profile: No results for input(s): INR, PROTIME in the last 168 hours. Cardiac Enzymes: No results for input(s): CKTOTAL, CKMB, CKMBINDEX, TROPONINI in the last 168 hours. BNP (last 3 results) No results for input(s): PROBNP in the last 8760 hours. HbA1C: No results for input(s): HGBA1C in the last 72 hours. CBG: No results for input(s): GLUCAP in the last 168 hours. Lipid Profile: No results for input(s): CHOL, HDL, LDLCALC, TRIG, CHOLHDL, LDLDIRECT in the last 72 hours. Thyroid Function Tests: No results for input(s): TSH, T4TOTAL, FREET4, T3FREE, THYROIDAB in the last 72 hours. Anemia Panel: No results for input(s): VITAMINB12, FOLATE, FERRITIN, TIBC, IRON, RETICCTPCT in the last 72 hours. Urine analysis:    Component Value Date/Time   COLORURINE yellow 09/13/2009 0850   APPEARANCEUR Clear 09/13/2009 0850   LABSPEC 1.015 09/13/2009 0850   PHURINE 8.5 09/13/2009 0850   HGBUR negative 09/13/2009 0850   BILIRUBINUR neg 10/18/2015 1336   PROTEINUR trace 10/18/2015 1336   UROBILINOGEN 0.2 10/18/2015 1336   UROBILINOGEN 0.2 09/13/2009 0850   NITRITE neg  10/18/2015 1336   NITRITE negative 09/13/2009 0850   LEUKOCYTESUR Negative 10/18/2015 1336    Radiological Exams on Admission: CT Chest Wo Contrast Result Date: 07/11/2023 CLINICAL DATA:  Shortness of breath and productive cough. EXAM: CT CHEST WITHOUT CONTRAST TECHNIQUE: Multidetector CT imaging of the chest was performed following the standard protocol without IV contrast. RADIATION DOSE REDUCTION: This exam was performed according to the departmental dose-optimization program which includes automated exposure control, adjustment of the mA and/or kV according to patient size and/or use of iterative reconstruction technique. COMPARISON:  October 24, 2020 FINDINGS: Cardiovascular: No significant vascular findings. Normal heart size with mild coronary artery calcification. No pericardial effusion. Mediastinum/Nodes: No enlarged mediastinal or axillary lymph nodes. Thyroid gland, trachea, and esophagus demonstrate no significant findings. Lungs/Pleura: Very mild posteromedial right upper lobe, posterior right middle lobe and posterior bibasilar linear scarring and/or atelectasis is seen. No acute infiltrate, pleural effusion or pneumothorax is identified. Upper Abdomen: No acute abnormality. Musculoskeletal: A chronic compression fracture deformity is seen at the level of T12. IMPRESSION: 1. Very mild posteromedial right upper lobe, posterior right middle lobe and posterior bibasilar linear scarring and/or atelectasis. 2. 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 Electronically Signed   By: Suzen Dials M.D.   On: 07/11/2023 17:40   DG Chest Portable 1 View Result Date: 07/11/2023 CLINICAL DATA:  Shortness of breath. EXAM: PORTABLE CHEST 1 VIEW COMPARISON:  October 07, 2022. FINDINGS: The heart size and mediastinal contours are within normal limits. Both lungs are clear. The visualized skeletal structures are unremarkable. IMPRESSION: No active disease. Electronically Signed   By:  Lynwood Landy Raddle M.D.   On: 07/11/2023 14:15      Assessment/Plan    Principal Problem:   COPD exacerbation (HCC)  ***        ***                  ***                  ***                  ***                  ***                 ***                  ***                  ***                  ***                  ***                  ***                  ***                 ***  DVT prophylaxis: SCD's ***  Code Status: Full code*** Family Communication: none*** Disposition Plan: Per Rounding Team Consults called: none***;  Admission status: ***    I SPENT GREATER THAN 75 *** MINUTES IN CLINICAL CARE TIME/MEDICAL DECISION-MAKING IN COMPLETING THIS ADMISSION.     Eva NOVAK Cesilia Shinn DO [facility] From 7PM - 7AM   07/11/2023, 7:26 PM   ***

## 2023-07-11 NOTE — ED Notes (Signed)
 Pt. Reports for 2 days she has been coughing and having trouble breathing.  Pt. Has congestion and severe wheezing with breathing.

## 2023-07-11 NOTE — ED Notes (Signed)
 Patient is being discharged from the Urgent Care and sent to the Emergency Department via POV driven by sister. Per Ozell Major FNP, patient is in need of higher level of care due to SOB, hypoxia and need for chest CT. Patient is aware and verbalizes understanding of plan of care. There were no vitals filed for this visit.

## 2023-07-11 NOTE — ED Provider Notes (Signed)
 [facility]    CSN: [id number] Arrival date & time: 07/11/23  1248      History   Chief Complaint Chief Complaint  Patient presents with   Shortness of Breath    HPI JANISE GORA is a 68 y.o. female.   HPI 68 year old female presents with shortness of breath since earlier this morning.  Patient's pulse ox at triage 84%.  Patient reports taking albuterol  nebulizer/breathing treatment at 830 this morning.  PMH significant for COPD, cigarette smoker, and HTN.  Patient is accompanied by her sister this afternoon.  Past Medical History:  Diagnosis Date   Anxiety    Arthritis    Asthma    COPD (chronic obstructive pulmonary disease) (HCC)    Depression    Pneumonia    Recurrent upper respiratory infection (URI)     Patient Active Problem List   Diagnosis Date Noted   Primary osteoarthritis of right foot 03/19/2023   COPD exacerbation (HCC) 09/06/2022   Trigger finger, left 06/12/2022   Primary osteoarthritis of left hip 06/12/2022   Scalp hematoma 02/25/2022   Senile purpura (HCC) 10/24/2021   Secondary osteoarthritis of right ankle 09/03/2021   Primary osteoarthritis of left hand 06/01/2021   Bilateral hand numbness 03/09/2021   Restrictive lung disease 11/02/2020   Benign paroxysmal positional vertigo, left 08/18/2020   Hyperlipidemia with target LDL less than 100 07/10/2020   Abnormal weight loss 07/10/2020   Pulmonary emphysema (HCC) 06/09/2020   Cervical fusion syndrome 09/29/2019   Cyst of finger of right hand 07/12/2019   Stress 06/28/2019   Essential hypertension 02/25/2019   Chronic diarrhea 01/21/2019   Chondromalacia of patella, left 03/09/2013   Venous (peripheral) insufficiency 09/13/2009   Lumbar degenerative disc disease 09/13/2009   CHRONIC OBSTRUCTIVE PULMONARY DISEASE, ACUTE EXACERBATION 06/15/2009   FATIGUE 10/11/2008   Allergic rhinitis 03/21/2008   MDD (major depressive disorder) 10/22/2005   Panic attack 10/22/2005    TOBACCO DEPENDENCE 10/22/2005   Osteoarthritis 10/22/2005    Past Surgical History:  Procedure Laterality Date   ankle sx  1973   APPENDECTOMY     BREAST REDUCTION SURGERY  06/24/2011   Procedure: MAMMARY REDUCTION  (BREAST);  Surgeon: Elna Pick, MD;  Location: [facility];  Service: Plastics;  Laterality: Bilateral;   CERVICAL DISC SURGERY     (? laminectomy?)   CESAREAN SECTION  1976   left groin hernia  1974   REDUCTION MAMMAPLASTY     TONSILLECTOMY      OB History   No obstetric history on file.      Home Medications    Prior to Admission medications   Medication Sig Start Date End Date Taking? Authorizing Provider  albuterol  (VENTOLIN  HFA) 108 (90 Base) MCG/ACT inhaler TAKE 2 PUFFS BY MOUTH EVERY 6 HOURS AS NEEDED FOR WHEEZE OR SHORTNESS OF BREATH 09/06/22   Bevin Asal S, DO  amLODipine  (NORVASC ) 2.5 MG tablet TAKE 1 TABLET BY MOUTH EVERY DAY 05/01/23   [name], MD  atorvastatin  (LIPITOR) 40 MG tablet TAKE 1 TABLET BY MOUTH EVERYDAY AT BEDTIME 05/20/23   [name], MD  celecoxib  (CELEBREX ) 200 MG capsule ONE TO 2 TABLETS BY MOUTH DAILY AS NEEDED FOR PAIN. 03/12/23   Curtis Debby PARAS, MD  diclofenac  Sodium (VOLTAREN ) 1 % GEL Apply 2 g topically 4 (four) times daily. To affected joint. 03/19/23   Curtis Debby PARAS, MD  FLUoxetine  (PROZAC ) 20 MG capsule TAKE 3 CAPSULES BY MOUTH EVERY DAY 01/03/23  [name], MD  gabapentin  (NEURONTIN ) 600 MG tablet 1200 mg in the morning, 1800 mg in the evening 06/17/23   Thekkekandam, Thomas J, MD  ipratropium-albuterol  (DUONEB) 0.5-2.5 (3) MG/3ML SOLN INHALE 3 ML BY NEBULIZER EVERY 6 HOURS AS NEEDED 02/14/23   [name], MD  losartan -hydrochlorothiazide  (HYZAAR) 100-25 MG tablet TAKE 1 TABLET BY MOUTH EVERY DAY 05/12/23   [name], MD  montelukast  (SINGULAIR ) 10 MG tablet TAKE 1 TABLET BY MOUTH EVERYDAY AT BEDTIME 05/20/23   [name], MD   Respiratory Therapy Supplies (FULL KIT NEBULIZER SET) MISC Nebulizer machine of patient's choice, please include tubes and hoses as needed.  Use as directed. 09/11/22   Bevin Bernice RAMAN, DO  Respiratory Therapy Supplies (NEBULIZER MASK ADULT) MISC 1 Device by Does not apply route as needed. 09/06/22   Bevin Bernice RAMAN, DO  traMADol  (ULTRAM ) 50 MG tablet Take 1 tablet (50 mg total) by mouth every 8 (eight) hours as needed for moderate pain (pain score 4-6). 10/31/22   Curtis Debby PARAS, MD  umeclidinium-vilanterol (ANORO ELLIPTA ) 62.5-25 MCG/ACT AEPB Inhale 1 puff into the lungs daily at 6 (six) AM. 03/19/22   [name], MD    Family History Family History  Problem Relation Age of Onset   Hypertension Mother    Depression Mother    Alzheimer's disease Mother    Diverticulitis Mother    Alcohol abuse Father    Diabetes Father    Cancer Other        pancreatic   Pancreatic cancer Maternal Grandmother    Colon cancer Neg Hx    Colon polyps Neg Hx    Esophageal cancer Neg Hx    Rectal cancer Neg Hx    Stomach cancer Neg Hx     Social History Social History   Tobacco Use   Smoking status: Every Day    Current packs/day: 0.50    Average packs/day: 0.5 packs/day for 35.0 years (17.5 ttl pk-yrs)    Types: Cigarettes   Smokeless tobacco: Current   Tobacco comments:    5 cigarettes day. Vape as well  Vaping Use   Vaping status: Never Used  Substance Use Topics   Alcohol use: No    Alcohol/week: 0.0 standard drinks of alcohol   Drug use: No     Allergies   Hydrocodone  and Penicillins   Review of Systems Review of Systems  Respiratory:  Positive for shortness of breath.      Physical Exam Triage Vital Signs ED Triage Vitals [07/11/23 1258]  Encounter Vitals Group     BP      Girls Systolic BP Percentile      Girls Diastolic BP Percentile      Boys Systolic BP Percentile      Boys Diastolic BP Percentile      Pulse      Resp      Temp      Temp src       SpO2      Weight      Height      Head Circumference      Peak Flow      Pain Score 0     Pain Loc      Pain Education      Exclude from Growth Chart    No data found.  Updated Vital Signs There were no vitals taken for this visit.   Physical Exam Vitals and nursing note reviewed.  Constitutional:  Appearance: Normal appearance. She is normal weight. She is ill-appearing.  HENT:     Head: Normocephalic and atraumatic.     Right Ear: Tympanic membrane, ear canal and external ear normal.     Left Ear: Tympanic membrane, ear canal and external ear normal.     Mouth/Throat:     Mouth: Mucous membranes are moist.     Pharynx: Oropharynx is clear.   Eyes:     Extraocular Movements: Extraocular movements intact.     Conjunctiva/sclera: Conjunctivae normal.     Pupils: Pupils are equal, round, and reactive to light.    Cardiovascular:     Rate and Rhythm: Tachycardia present.     Heart sounds: Normal heart sounds.  Pulmonary:     Comments: Diffuse scattered rhonchi and crackles, with mild intermittent wheezes noted  Musculoskeletal:        General: Normal range of motion.   Skin:    General: Skin is warm and dry.   Neurological:     General: No focal deficit present.     Mental Status: She is alert and oriented to person, place, and time. Mental status is at baseline.   Psychiatric:        Mood and Affect: Mood normal.        Behavior: Behavior normal.      UC Treatments / Results  Labs (all labs ordered are listed, but only abnormal results are displayed) Labs Reviewed - No data to display  EKG   Radiology DG Chest Portable 1 View Result Date: 07/11/2023 CLINICAL DATA:  Shortness of breath. EXAM: PORTABLE CHEST 1 VIEW COMPARISON:  October 07, 2022. FINDINGS: The heart size and mediastinal contours are within normal limits. Both lungs are clear. The visualized skeletal structures are unremarkable. IMPRESSION: No active disease. Electronically Signed    By: Lynwood Landy Raddle M.D.   On: 07/11/2023 14:15    Procedures Procedures (including critical care time)  Medications Ordered in UC Medications - No data to display  Initial Impression / Assessment and Plan / UC Course  I have reviewed the triage vital signs and the nursing notes.  Pertinent labs & imaging results that were available during my care of the patient were reviewed by me and considered in my medical decision making (see chart for details).     MDM: 1.  Shortness of breath-[facility] for further evaluation of shortness of breath.  Patient agreed and verbalized understanding of these instructions and this plan of care today.  COPD exacerbation-same as 1.  Patient discharged to ED, hemodynamically stable. Final Clinical Impressions(s) / UC Diagnoses   Final diagnoses:  SOB (shortness of breath)  COPD exacerbation [facility])     Discharge Instructions      Advised patient to go to [facility] ED now for further evaluation of shortness of breath.     ED Prescriptions   None    PDMP not reviewed this encounter.   Teddy Sharper, FNP 07/11/23 1531

## 2023-07-11 NOTE — ED Triage Notes (Signed)
 Pt arrived POV from urgent care due to SOB, cough congestion since Monday. Sats 82 % on room air. Pt wheezing upon arrival. RT in to eval and give neb tx. Using nebs at home without relief

## 2023-07-11 NOTE — Discharge Instructions (Addendum)
 Advised patient to go to [facility] ED now for further evaluation of shortness of breath.

## 2023-07-11 NOTE — ED Notes (Signed)
 Patient transported to CT

## 2023-07-11 NOTE — ED Notes (Signed)
 ED Provider at bedside.

## 2023-07-11 NOTE — ED Provider Notes (Addendum)
 [place] [facility] Provider Note   CSN: [id number] Arrival date & time: 07/11/23  1339     Patient presents with: Shortness of Breath   Sherri [name] is a 68 y.o. female.   This is a 68 year old female history of COPD not on oxygen presenting the emergency department for shortness of breath.  Reports that she has had progressive worsening of shortness of breath over the past 4 days which acutely worsened this morning.  Increased cough and sputum production.  However no change in color.  No fevers no chills.  No chest pain.  Worsening shortness of breath/dyspnea on exertion as well.   Shortness of Breath      Prior to Admission medications   Medication Sig Start Date End Date Taking? Authorizing Provider  albuterol  (VENTOLIN  HFA) 108 (90 Base) MCG/ACT inhaler TAKE 2 PUFFS BY MOUTH EVERY 6 HOURS AS NEEDED FOR WHEEZE OR SHORTNESS OF BREATH 09/06/22   Bevin Asal S, DO  amLODipine  (NORVASC ) 2.5 MG tablet TAKE 1 TABLET BY MOUTH EVERY DAY 05/01/23   [name], MD  atorvastatin  (LIPITOR) 40 MG tablet TAKE 1 TABLET BY MOUTH EVERYDAY AT BEDTIME 05/20/23   [name], MD  celecoxib  (CELEBREX ) 200 MG capsule ONE TO 2 TABLETS BY MOUTH DAILY AS NEEDED FOR PAIN. 03/12/23   Curtis Debby PARAS, MD  diclofenac  Sodium (VOLTAREN ) 1 % GEL Apply 2 g topically 4 (four) times daily. To affected joint. 03/19/23   Curtis Debby PARAS, MD  FLUoxetine  (PROZAC ) 20 MG capsule TAKE 3 CAPSULES BY MOUTH EVERY DAY 01/03/23   [name], MD  gabapentin  (NEURONTIN ) 600 MG tablet 1200 mg in the morning, 1800 mg in the evening 06/17/23   Thekkekandam, Thomas J, MD  ipratropium-albuterol  (DUONEB) 0.5-2.5 (3) MG/3ML SOLN INHALE 3 ML BY NEBULIZER EVERY 6 HOURS AS NEEDED 02/14/23   [name], MD  losartan -hydrochlorothiazide  (HYZAAR) 100-25 MG tablet TAKE 1 TABLET BY MOUTH EVERY DAY 05/12/23   [name], MD  montelukast   (SINGULAIR ) 10 MG tablet TAKE 1 TABLET BY MOUTH EVERYDAY AT BEDTIME 05/20/23   [name], MD  Respiratory Therapy Supplies (FULL KIT NEBULIZER SET) MISC Nebulizer machine of patient's choice, please include tubes and hoses as needed.  Use as directed. 09/11/22   Bevin Asal RAMAN, DO  Respiratory Therapy Supplies (NEBULIZER MASK ADULT) MISC 1 Device by Does not apply route as needed. 09/06/22   Bevin Asal RAMAN, DO  traMADol  (ULTRAM ) 50 MG tablet Take 1 tablet (50 mg total) by mouth every 8 (eight) hours as needed for moderate pain (pain score 4-6). 10/31/22   Curtis Debby PARAS, MD  umeclidinium-vilanterol (ANORO ELLIPTA ) 62.5-25 MCG/ACT AEPB Inhale 1 puff into the lungs daily at 6 (six) AM. 03/19/22   [name], MD    Allergies: Hydrocodone  and Penicillins    Review of Systems  Respiratory:  Positive for shortness of breath.     Updated Vital Signs BP (!) 160/71   Pulse 74   Temp 99.6 F (37.6 C)   Resp (!) 23   Wt 68 kg   SpO2 95%   BMI 24.96 kg/m   Physical Exam Vitals and nursing note reviewed.  Constitutional:      General: She is not in acute distress. HENT:     Head: Normocephalic.   Cardiovascular:     Rate and Rhythm: Normal rate and regular rhythm.  Pulmonary:     Effort: Tachypnea and accessory  muscle usage present.     Breath sounds: Wheezing present.   Musculoskeletal:     Cervical back: Normal range of motion.     Right lower leg: No edema.     Left lower leg: No edema.   Skin:    General: Skin is warm.     Capillary Refill: Capillary refill takes less than 2 seconds.   Neurological:     Mental Status: She is alert and oriented to person, place, and time.   Psychiatric:        Mood and Affect: Mood normal.        Behavior: Behavior normal.     (all labs ordered are listed, but only abnormal results are displayed) Labs Reviewed  BASIC METABOLIC PANEL WITH GFR - Abnormal; Notable for the following components:      Result Value    Chloride 95 (*)    All other components within normal limits  CBC - Abnormal; Notable for the following components:   MCV 100.2 (*)    MCH 34.3 (*)    All other components within normal limits  RESP PANEL BY RT-PCR (RSV, FLU A&B, COVID)  [id number]    EKG: EKG Interpretation Date/Time:  Friday July 11 2023 13:51:18 EDT Ventricular Rate:  72 PR Interval:  153 QRS Duration:  99 QT Interval:  413 QTC Calculation: 452 R Axis:   97  Text Interpretation: Sinus rhythm Probable right ventricular hypertrophy Nonspecific T abnormalities, lateral leads Confirmed by Neysa Clap [phone]) on 07/11/2023 2:04:32 PM  Radiology: DG Chest Portable 1 View Result Date: 07/11/2023 CLINICAL DATA:  Shortness of breath. EXAM: PORTABLE CHEST 1 VIEW COMPARISON:  October 07, 2022. FINDINGS: The heart size and mediastinal contours are within normal limits. Both lungs are clear. The visualized skeletal structures are unremarkable. IMPRESSION: No active disease. Electronically Signed   By: Lynwood Landy Raddle M.D.   On: 07/11/2023 14:15     Procedures   Medications Ordered in the ED  ipratropium-albuterol  (DUONEB) 0.5-2.5 (3) MG/3ML nebulizer solution (3 mLs  Given by Other 07/11/23 1352)  albuterol  (PROVENTIL ) (2.5 MG/3ML) 0.083% nebulizer solution (2.5 mg  Given by Other 07/11/23 1352)  methylPREDNISolone  sodium succinate (SOLU-MEDROL ) 125 mg/2 mL injection 125 mg (125 mg Intravenous Given 07/11/23 1438)  albuterol  (PROVENTIL ) (2.5 MG/3ML) 0.083% nebulizer solution 5 mg (5 mg Nebulization Given 07/11/23 1456)                                    Medical Decision Making This is a 67 year old female presenting emergency department for shortness of breath.  Complex past medical history to include COPD/asthma anxiety, depression.  She does not wear oxygen per her report.  Family member notes she recently started on a daily inhaler.  Per chart review does not appear to have recent admissions for COPD.  Went to urgent care  prior to arrival and was reportedly hypoxic in the 80s and sent here.  Patient was 82% with exertion, with some tachypnea and increased work of breathing when moving from wheelchair to the stretcher, but normalized quickly currently low 90s on room air.  Does not appear to be in significant respiratory distress.  Lungs with diffuse wheezing.  Given 5 mg albuterol  0.5 mg Atrovent initially with Solu-Medrol .  Continue to have wheezing repeat dose ordered.  Chest x-ray without pneumonia labs with no leukocytosis to suggest infectious process.  No anemia.  No significant  metabolic derangements.  Normal kidney function.  Did mildly desat again placed on 2 L nasal cannula.  Given patient's hypoxia will admit for acute hypoxic respiratory failure secondary to COPD exacerbation.  Amount and/or Complexity of Data Reviewed Labs: ordered. Radiology: ordered.  Risk Prescription drug management. Decision regarding hospitalization.      Final diagnoses:  COPD exacerbation [facility])    ED Discharge Orders     None          Neysa Caron PARAS, DO 07/11/23 1448    Neysa Caron PARAS, DO 07/11/23 1505

## 2023-07-11 NOTE — ED Notes (Signed)
XR at bedside for portable CXR

## 2023-07-11 NOTE — Plan of Care (Signed)
 Patient is a 68 year old female with history of COPD, not on oxygen at home who presented to the emergency department at [facility] with complaint of shortness of breath, breathing.  Dyspnea has worsened for the last 4 days with increased cough and sputum production.  No fever or chest pain.  On presentation, she was wheezing, tachypneic.  She was treated with albuterol , Solu-Medrol  after which her respiratory status improved.  She is still requiring 2 L of oxygen per minute.  Admission requested for the management of COPD exacerbation.

## 2023-07-11 NOTE — ED Triage Notes (Addendum)
 Pt c/o cold sxs since Monday. Congestion and Shortness of breath as well as weakness. Albuterol  neb every 4hr. Hx of COPD and emphysema.

## 2023-07-12 DIAGNOSIS — J441 Chronic obstructive pulmonary disease with (acute) exacerbation: Principal | ICD-10-CM | POA: Diagnosis present

## 2023-07-12 DIAGNOSIS — Z7951 Long term (current) use of inhaled steroids: Secondary | ICD-10-CM | POA: Diagnosis not present

## 2023-07-12 DIAGNOSIS — I10 Essential (primary) hypertension: Secondary | ICD-10-CM | POA: Diagnosis present

## 2023-07-12 DIAGNOSIS — Z880 Allergy status to penicillin: Secondary | ICD-10-CM | POA: Diagnosis not present

## 2023-07-12 DIAGNOSIS — R0602 Shortness of breath: Secondary | ICD-10-CM | POA: Diagnosis present

## 2023-07-12 DIAGNOSIS — F17210 Nicotine dependence, cigarettes, uncomplicated: Secondary | ICD-10-CM | POA: Diagnosis present

## 2023-07-12 DIAGNOSIS — F411 Generalized anxiety disorder: Secondary | ICD-10-CM | POA: Diagnosis present

## 2023-07-12 DIAGNOSIS — Z79899 Other long term (current) drug therapy: Secondary | ICD-10-CM | POA: Diagnosis not present

## 2023-07-12 DIAGNOSIS — J9601 Acute respiratory failure with hypoxia: Secondary | ICD-10-CM | POA: Diagnosis present

## 2023-07-12 DIAGNOSIS — Z885 Allergy status to narcotic agent status: Secondary | ICD-10-CM | POA: Diagnosis not present

## 2023-07-12 DIAGNOSIS — Z818 Family history of other mental and behavioral disorders: Secondary | ICD-10-CM | POA: Diagnosis not present

## 2023-07-12 DIAGNOSIS — Z8249 Family history of ischemic heart disease and other diseases of the circulatory system: Secondary | ICD-10-CM | POA: Diagnosis not present

## 2023-07-12 DIAGNOSIS — F32A Depression, unspecified: Secondary | ICD-10-CM | POA: Diagnosis present

## 2023-07-12 DIAGNOSIS — G44209 Tension-type headache, unspecified, not intractable: Secondary | ICD-10-CM | POA: Diagnosis present

## 2023-07-12 DIAGNOSIS — E785 Hyperlipidemia, unspecified: Secondary | ICD-10-CM | POA: Diagnosis present

## 2023-07-12 DIAGNOSIS — Z1152 Encounter for screening for COVID-19: Secondary | ICD-10-CM | POA: Diagnosis not present

## 2023-07-12 DIAGNOSIS — Z800 Family history of malignant neoplasm of digestive organs: Secondary | ICD-10-CM | POA: Diagnosis not present

## 2023-07-12 DIAGNOSIS — Z833 Family history of diabetes mellitus: Secondary | ICD-10-CM | POA: Diagnosis not present

## 2023-07-12 DIAGNOSIS — Z820 Family history of epilepsy and other diseases of the nervous system: Secondary | ICD-10-CM | POA: Diagnosis not present

## 2023-07-12 LAB — COMPREHENSIVE METABOLIC PANEL WITH GFR
ALT: 37 U/L (ref 0–44)
AST: 34 U/L (ref 15–41)
Albumin: 3.7 g/dL (ref 3.5–5.0)
Alkaline Phosphatase: 81 U/L (ref 38–126)
Anion gap: 11 (ref 5–15)
BUN: 15 mg/dL (ref 8–23)
CO2: 27 mmol/L (ref 22–32)
Calcium: 9 mg/dL (ref 8.9–10.3)
Chloride: 99 mmol/L (ref 98–111)
Creatinine, Ser: 0.85 mg/dL (ref 0.44–1.00)
GFR, Estimated: >60 mL/min (ref >60)
Glucose, Bld: 171 mg/dL — ABNORMAL HIGH (ref 70–99)
Potassium: 3.6 mmol/L (ref 3.5–5.1)
Sodium: 137 mmol/L (ref 135–145)
Total Bilirubin: 0.5 mg/dL (ref 0.0–1.2)
Total Protein: 6.5 g/dL (ref 6.5–8.1)

## 2023-07-12 LAB — MAGNESIUM: Magnesium: 2.1 mg/dL (ref 1.7–2.4)

## 2023-07-12 LAB — CBC WITH DIFFERENTIAL/PLATELET
Abs Immature Granulocytes: 0.03 10*3/uL (ref 0.00–0.07)
Basophils Absolute: 0 10*3/uL (ref 0.0–0.1)
Basophils Relative: 0 %
Eosinophils Absolute: 0 10*3/uL (ref 0.0–0.5)
Eosinophils Relative: 0 %
HCT: 39.6 % (ref 36.0–46.0)
Hemoglobin: 13 g/dL (ref 12.0–15.0)
Immature Granulocytes: 0 %
Lymphocytes Relative: 5 %
Lymphs Abs: 0.4 10*3/uL — ABNORMAL LOW (ref 0.7–4.0)
MCH: 34.3 pg — ABNORMAL HIGH (ref 26.0–34.0)
MCHC: 32.8 g/dL (ref 30.0–36.0)
MCV: 104.5 fL — ABNORMAL HIGH (ref 80.0–100.0)
Monocytes Absolute: 0.1 10*3/uL (ref 0.1–1.0)
Monocytes Relative: 1 %
Neutro Abs: 7.5 10*3/uL (ref 1.7–7.7)
Neutrophils Relative %: 94 %
Platelets: 251 10*3/uL (ref 150–400)
RBC: 3.79 MIL/uL — ABNORMAL LOW (ref 3.87–5.11)
RDW: 13 % (ref 11.5–15.5)
WBC: 8.1 10*3/uL (ref 4.0–10.5)
nRBC: 0 % (ref 0.0–0.2)

## 2023-07-12 LAB — BLOOD GAS, VENOUS
Acid-Base Excess: 7.6 mmol/L — ABNORMAL HIGH (ref 0.0–2.0)
Bicarbonate: 33.7 mmol/L — ABNORMAL HIGH (ref 20.0–28.0)
O2 Saturation: 78.6 %
Patient temperature: 37
pCO2, Ven: 52 mmHg (ref 44–60)
pH, Ven: 7.42 (ref 7.25–7.43)
pO2, Ven: 46 mmHg — ABNORMAL HIGH (ref 32–45)

## 2023-07-12 LAB — PHOSPHORUS: Phosphorus: 3.4 mg/dL (ref 2.5–4.6)

## 2023-07-12 LAB — BRAIN NATRIURETIC PEPTIDE: B Natriuretic Peptide: 170.3 pg/mL — ABNORMAL HIGH (ref 0.0–100.0)

## 2023-07-12 LAB — PROCALCITONIN: Procalcitonin: <0.1 ng/mL

## 2023-07-12 MED ORDER — GABAPENTIN 400 MG PO CAPS
1200.0000 mg | ORAL_CAPSULE | Freq: Two times a day (BID) | ORAL | Status: DC
  Administered 2023-07-12 – 2023-07-14 (×5): 1200 mg via ORAL
  Filled 2023-07-12: qty 3
  Filled 2023-07-12: qty 12
  Filled 2023-07-12: qty 3
  Filled 2023-07-12: qty 12
  Filled 2023-07-12 (×2): qty 3
  Filled 2023-07-12: qty 12

## 2023-07-12 MED ORDER — GABAPENTIN 600 MG PO TABS: 1200.0000 mg | ORAL_TABLET | Freq: Two times a day (BID) | ORAL | Status: DC

## 2023-07-12 MED ORDER — GABAPENTIN 400 MG PO CAPS: 1800.0000 mg | ORAL_CAPSULE | Freq: Every day | ORAL | Status: DC

## 2023-07-12 MED ORDER — GABAPENTIN 400 MG PO CAPS: 1200.0000 mg | ORAL_CAPSULE | Freq: Every morning | ORAL | Status: DC

## 2023-07-12 NOTE — Progress Notes (Signed)
 PROGRESS NOTE  Sherri [name]  DOB: October 09, 1955  PCP: [name], MD FMW:[id number]  DOA: 07/11/2023  LOS: 0 days  Hospital Day: 2  Brief narrative: Sherri [name] is a 68 y.o. female with PMH significant for HTN, HLD, COPD, anxiety, depression. 6/27, patient presented to the ED with complaint of progressively worsening congestion, shortness of breath, wheezing, generalized weakness for 4 days. Not on supplemental oxygen at home Continues to smoke, 5 to 6 cigarettes a day  In the ED, patient was afebrile, hemodynamic stable, believing 2 L oxygen Labs with WBC, hemoglobin normal, renal function normal CT chest showed 1. Very mild posteromedial right upper lobe, posterior right middle lobe and posterior bibasilar linear scarring and/or atelectasis. 2. 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  Patient was started on IV Solu-Medrol , breathing treatment.   Admitted to [facility]  Subjective: Patient was seen and examined this morning.  Pleasant elderly Caucasian female.  Propped up in bed.  On supplemental oxygen.  Husband at bedside. Patient had audible wheezing and had cough on deep breathing. Chart reviewed. Hemodynamically stable.  Repeat labs this morning with WC count normal, procalcitonin level not elevated, VBG with pH 7.42, PCO252, bicarb 34  Assessment and plan: Acute COPD exacerbation Acute hypoxic respiratory failure Presented with progressively worsening shortness of breath, wheezing for last 4 days No fever, WBC count normal, procalcitonin level normal, compensated chronic CO2 retention on VBG CT chest without any acute infiltrates, chronic scarring Patient was started on IV Solu-Medrol , breathing treatment, incentive spirometry.  Also on azithromycin  for anti-inflammatory effect.  QTc 452 ms Patient continues to have diffuse wheezing, cough.  I would continue IV Solu-Medrol  80 mg twice daily today. Encourage ambulation. Check ambulatory  oxygen requirement Recent Labs  Lab 07/11/23 1414 07/12/23 0539  WBC 9.8 8.1  PROCALCITON  --  <0.10   Hypertension Continue amlodipine , losartan .  HCTZ on hold are stopped  HLD Statin    Tension headache Intermittent  Continue Fioricet   Generalized anxiety disorder Continue Prozac , gabapentin   Chronic daily smoker Smokes 5-6 cigarettes a day Counseled to quit     Mobility: Encourage ambulation.  Independent at baseline.  Goals of care   Code Status: Full Code     DVT prophylaxis:  SCDs Start: 07/11/23 1921   Antimicrobials: Azithromycin  Fluid: None Consultants: None Family Communication: Husband at bedside  Status: Observation Level of care:  Med-Surg   Patient is from: Home Needs to continue in-hospital care: Continues to have wheezing.  Needs IV steroids Anticipated d/c to: Home in 1 to 2 days      Diet:  Diet Order             Diet regular Room service appropriate? Yes; Fluid consistency: Thin  Diet effective now                   Scheduled Meds:  amLODipine   2.5 mg Oral Daily   atorvastatin   40 mg Oral QHS   FLUoxetine   60 mg Oral Daily   gabapentin   1,200 mg Oral BID   ipratropium-albuterol   3 mL Nebulization Q6H   losartan   100 mg Oral Daily   methylPREDNISolone  (SOLU-MEDROL ) injection  80 mg Intravenous Q12H   montelukast   10 mg Oral QHS    PRN meds: acetaminophen  **OR** acetaminophen , albuterol , butalbital-acetaminophen -caffeine, ketorolac, melatonin, nicotine, ondansetron  (ZOFRAN ) IV   Infusions:   azithromycin  500 mg (07/11/23 2152)    Antimicrobials: Anti-infectives (From admission, onward)  Start     Dose/Rate Route Frequency Ordered Stop   07/11/23 2000  azithromycin  (ZITHROMAX ) 500 mg in sodium chloride  0.9 % 250 mL IVPB        500 mg 250 mL/hr over 60 Minutes Intravenous Every 24 hours 07/11/23 1944         Objective: Vitals:   07/12/23 0623 07/12/23 0900  BP: 121/67   Pulse: 76   Resp: 20   Temp:  98.5 F (36.9 C)   SpO2: 97% 96%    Intake/Output Summary (Last 24 hours) at 07/12/2023 1217 Last data filed at 07/12/2023 0300 Gross per 24 hour  Intake 505 ml  Output --  Net 505 ml   Filed Weights   07/11/23 1347 07/11/23 1808 07/12/23 0623  Weight: 68 kg 70.9 kg 71.5 kg   Weight change:  Body mass index is 26.23 kg/m.   Physical Exam: General exam: Pleasant, elderly Caucasian female Skin: No rashes, lesions or ulcers. HEENT: Atraumatic, normocephalic, no obvious bleeding Lungs: Has bilateral diffuse wheezing and coughs on deep breathing CVS: S1, S2, no murmur,   GI/Abd: Soft, nontender, nondistended, bowel sound present,   CNS: Alert, awake, oriented x 3 Psychiatry: Mood appropriate,  Extremities: No pedal edema, no calf tenderness,   Data Review: I have personally reviewed the laboratory data and studies available.  F/u labs ordered Unresulted Labs (From admission, onward)    None      Signed, Chapman Rota, MD [facility] 07/12/2023

## 2023-07-12 NOTE — Care Management Obs Status (Signed)
 MEDICARE OBSERVATION STATUS NOTIFICATION   Patient Details  Name: Sherri [name] MRN: [id number] Date of Birth: December 18, 1955   Medicare Observation Status Notification Given:  Yes    Alzora Ha LILLETTE Fenton, LCSW 07/12/2023, 3:28 PM

## 2023-07-12 NOTE — Plan of Care (Signed)

## 2023-07-12 NOTE — Plan of Care (Signed)
  Problem: Clinical Measurements: Goal: Respiratory complications will improve 07/12/2023 0544 by Terra Hadassah Genevieve FORBES, RN Outcome: Progressing 07/12/2023 0544 by Terra Hadassah Genevieve FORBES, RN Outcome: Progressing

## 2023-07-13 DIAGNOSIS — J441 Chronic obstructive pulmonary disease with (acute) exacerbation: Secondary | ICD-10-CM | POA: Diagnosis not present

## 2023-07-13 MED ORDER — IPRATROPIUM-ALBUTEROL 0.5-2.5 (3) MG/3ML IN SOLN
3.0000 mL | RESPIRATORY_TRACT | Status: DC
  Administered 2023-07-13 – 2023-07-14 (×7): 3 mL via RESPIRATORY_TRACT
  Filled 2023-07-13 (×6): qty 3

## 2023-07-13 MED ORDER — DM-GUAIFENESIN ER 30-600 MG PO TB12
1.0000 | ORAL_TABLET | Freq: Two times a day (BID) | ORAL | Status: DC
  Administered 2023-07-13 – 2023-07-14 (×3): 1 via ORAL
  Filled 2023-07-13 (×3): qty 1

## 2023-07-13 NOTE — Progress Notes (Signed)
   07/13/23 1453  TOC Brief Assessment  Insurance and Status Reviewed  Patient has primary care physician Yes (METHENEY, CATHERINE D)  Home environment has been reviewed Home with spouse  Prior level of function: Independent  Prior/Current Home Services No current home services  Social Drivers of Health Review SDOH reviewed no interventions necessary  Readmission risk has been reviewed Yes Dori)  Transition of care needs no transition of care needs at this time       Transition of Care Department (TOC) has reviewed patient and no TOC needs have been identified at this time. We will continue to monitor patient advancement through interdisciplinary progression rounds. If new patient transition needs arise, please place a TOC consult.

## 2023-07-13 NOTE — Progress Notes (Signed)
 PROGRESS NOTE  Loretha Ure Finau  DOB: 20-Oct-1955  PCP: [name], MD FMW:[id number]  DOA: 07/11/2023  LOS: 1 day  Hospital Day: 3  Brief narrative: Sherri [name] is a 68 y.o. female with PMH significant for HTN, HLD, COPD, anxiety, depression. 6/27, patient presented to the ED with complaint of progressively worsening congestion, shortness of breath, wheezing, generalized weakness for 4 days. Not on supplemental oxygen at home Continues to smoke, 5 to 6 cigarettes a day  In the ED, patient was afebrile, hemodynamic stable, believing 2 L oxygen Labs with WBC, hemoglobin normal, renal function normal CT chest showed 1. Very mild posteromedial right upper lobe, posterior right middle lobe and posterior bibasilar linear scarring and/or atelectasis. 2. 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  Patient was started on IV Solu-Medrol , breathing treatment.   Admitted to [facility]  Subjective: Patient was seen and examined this morning.   Propped up in bed.  Audibly wheezing.  Not on supplemental oxygen.  Apparently, she was just helped to ambulate without oxygen and her saturation dropped down to 80s and she had to be brought back to bed.   Husband was at bedside. On exam, she has diffuse wheezing bilaterally.  She understands why she needs to continue IV steroids and the hospital and cannot be discharged today.  Assessment and plan: Acute COPD exacerbation Acute hypoxic respiratory failure Presented with progressively worsening shortness of breath, wheezing for last 4 days No fever, WBC count normal, procalcitonin level normal, compensated chronic CO2 retention on VBG CT chest without any acute infiltrates, chronic scarring Patient was started on IV Solu-Medrol , breathing treatment, incentive spirometry.  Also on azithromycin  for anti-inflammatory effect.  QTc 452 ms Patient continues to have diffuse wheezing, cough.  Unable to tolerate ambulation today.   For today, I would continue IV Solu-Medrol  80 mg twice daily today. Encourage ambulation again Check ambulatory oxygen requirement Add Mucinex DM Recent Labs  Lab 07/11/23 1414 07/12/23 0539  WBC 9.8 8.1  PROCALCITON  --  <0.10   Hypertension Continue amlodipine , losartan .  HCTZ on hold  HLD Statin    Tension headache Intermittent  Continue Fioricet   Generalized anxiety disorder Continue Prozac , gabapentin   Chronic daily smoker Smokes 5-6 cigarettes a day Counseled to quit Nicotine patch offered     Mobility: Encourage ambulation.  Independent at baseline.  Goals of care   Code Status: Full Code     DVT prophylaxis:  SCDs Start: 07/11/23 1921   Antimicrobials: Azithromycin  Fluid: None Consultants: None Family Communication: Husband at bedside  Status: Observation Level of care:  Med-Surg   Patient is from: Home Needs to continue in-hospital care: Continues to have wheezing.  Needs IV steroids Anticipated d/c to: Home hopefully tomorrow      Diet:  Diet Order             Diet regular Room service appropriate? Yes; Fluid consistency: Thin  Diet effective now                   Scheduled Meds:  amLODipine   2.5 mg Oral Daily   atorvastatin   40 mg Oral QHS   FLUoxetine   60 mg Oral Daily   gabapentin   1,200 mg Oral BID   ipratropium-albuterol   3 mL Nebulization Q6H   losartan   100 mg Oral Daily   methylPREDNISolone  (SOLU-MEDROL ) injection  80 mg Intravenous Q12H   montelukast   10 mg Oral QHS    PRN meds: acetaminophen  **OR** acetaminophen ,  albuterol , butalbital-acetaminophen -caffeine, ketorolac, melatonin, nicotine, ondansetron  (ZOFRAN ) IV   Infusions:   azithromycin  Stopped (07/12/23 2335)    Antimicrobials: Anti-infectives (From admission, onward)    Start     Dose/Rate Route Frequency Ordered Stop   07/11/23 2000  azithromycin  (ZITHROMAX ) 500 mg in sodium chloride  0.9 % 250 mL IVPB        500 mg 250 mL/hr over 60 Minutes  Intravenous Every 24 hours 07/11/23 1944         Objective: Vitals:   07/13/23 0757 07/13/23 0840  BP:  127/78  Pulse:  73  Resp:    Temp:  97.8 F (36.6 C)  SpO2: 95% 90%    Intake/Output Summary (Last 24 hours) at 07/13/2023 1040 Last data filed at 07/13/2023 0900 Gross per 24 hour  Intake 1147.98 ml  Output --  Net 1147.98 ml   Filed Weights   07/11/23 1347 07/11/23 1808 07/12/23 0623  Weight: 68 kg 70.9 kg 71.5 kg   Weight change:  Body mass index is 26.23 kg/m.   Physical Exam: General exam: Pleasant, elderly Caucasian female Skin: No rashes, lesions or ulcers. HEENT: Atraumatic, normocephalic, no obvious bleeding Lungs: Continues to have diffuse bilateral wheezing and cough on deep breathing. CVS: S1, S2, no murmur,   GI/Abd: Soft, nontender, nondistended, bowel sound present,   CNS: Alert, awake, oriented x 3 Psychiatry: Mood appropriate,  Extremities: No pedal edema, no calf tenderness,   Data Review: I have personally reviewed the laboratory data and studies available.  F/u labs ordered Unresulted Labs (From admission, onward)    None      Signed, Chapman Rota, MD [facility] 07/13/2023

## 2023-07-13 NOTE — Progress Notes (Signed)
SATURATION QUALIFICATIONS: (This note is used to comply with regulatory documentation for home oxygen)  Patient Saturations on Room Air at Rest = 91%  Patient Saturations on Room Air while Ambulating = 86%  Patient Saturations on 2 Liters of oxygen while Ambulating = 94%  Please briefly explain why patient needs home oxygen: 

## 2023-07-13 NOTE — Plan of Care (Signed)

## 2023-07-14 ENCOUNTER — Ambulatory Visit: Admitting: Medical-Surgical

## 2023-07-14 DIAGNOSIS — J441 Chronic obstructive pulmonary disease with (acute) exacerbation: Secondary | ICD-10-CM | POA: Diagnosis not present

## 2023-07-14 MED ORDER — AZITHROMYCIN 250 MG PO TABS: 500.0000 mg | ORAL_TABLET | Freq: Every day | ORAL | Status: DC

## 2023-07-14 MED ORDER — AZITHROMYCIN 500 MG PO TABS: ORAL_TABLET | ORAL | 0 refills | Status: DC

## 2023-07-14 MED ORDER — PREDNISONE 10 MG PO TABS
ORAL_TABLET | ORAL | 0 refills | Status: DC
Start: 2023-07-14 — End: 2023-08-28

## 2023-07-14 MED ORDER — GUAIFENESIN ER 600 MG PO TB12: 1200.0000 mg | ORAL_TABLET | Freq: Two times a day (BID) | ORAL | 0 refills | Status: AC

## 2023-07-14 NOTE — TOC Initial Note (Addendum)
 Transition of Care [facility]) - Initial/Assessment Note    Patient Details  Name: Sherri [name] MRN: [id number] Date of Birth: [birth year]  Transition of Care [facility]) CM/SW Contact:    Doneta Glenys DASEN, RN Phone Number: 07/14/2023, 12:15 PM  Clinical Narrative:                 CM spoke with patient. PTA lives in a house with spouse; PCP/insurance verified; Denies DME, HH, oygen, or SDOH needs; Patients husband will transport home at discharge. Patient given choice for home oxygen. CM spoke with [employer] [facility]) for home oxygen that will be delivered to patients room. [employer] information added to patients AVS. TOC signing off.   Expected Discharge Plan: Home/Self Care Barriers to Discharge: No Barriers Identified   Patient Goals and CMS Choice Patient states their goals for this hospitalization and ongoing recovery are:: Home with spouse CMS Medicare.gov Compare Post Acute Care list provided to::  (NA) Choice offered to / list presented to : Patient [place] ownership interest in [facility].provided to:: Parent NA    Expected Discharge Plan and Services In-house Referral: NA Discharge Planning Services: CM Consult Post Acute Care Choice: NA Living arrangements for the past 2 months: Single Family Home Expected Discharge Date: 07/14/23               DME Arranged: Oxygen DME Agency: [employer] Date DME Agency Contacted: 07/14/23   Representative spoke with at DME Agency: [place] HH Arranged: NA HH Agency: NA        Prior Living Arrangements/Services Living arrangements for the past 2 months: Single Family Home Lives with:: Spouse Patient language and need for interpreter reviewed:: Yes Do you feel safe going back to the place where you live?: Yes      Need for Family Participation in Patient Care: No (Comment) Care giver support system in place?: Yes (comment) Current home services:  (NA) Criminal Activity/Legal Involvement Pertinent to Current  Situation/Hospitalization: No - Comment as needed  Activities of Daily Living   ADL Screening (condition at time of admission) Independently performs ADLs?: Yes (appropriate for developmental age) Is the patient deaf or have difficulty hearing?: Yes (HOH) Does the patient have difficulty seeing, even when wearing glasses/contacts?: Yes (Reading glasses) Does the patient have difficulty concentrating, remembering, or making decisions?: Yes (Forgetful)  Permission Sought/Granted Permission sought to share information with : Case Manager Permission granted to share information with : Yes, Verbal Permission Granted     Permission granted to share info w AGENCY: [employer]        Emotional Assessment Appearance:: Appears stated age Attitude/Demeanor/Rapport: Engaged Affect (typically observed): Appropriate Orientation: : Oriented to Self, Oriented to Place, Oriented to  Time, Oriented to Situation Alcohol / Substance Use: Tobacco Use Psych Involvement: No (comment)  Admission diagnosis:  COPD exacerbation (HCC) [J44.1] Patient Active Problem List   Diagnosis Date Noted   Acute hypoxic respiratory failure (HCC) 07/12/2023   Tension headache 07/12/2023   GAD (generalized anxiety disorder) 07/12/2023   COPD exacerbation (HCC) 07/12/2023   Primary osteoarthritis of right foot 03/19/2023   COPD with acute exacerbation (HCC) 09/06/2022   Trigger finger, left 06/12/2022   Primary osteoarthritis of left hip 06/12/2022   Scalp hematoma 02/25/2022   Senile purpura (HCC) 10/24/2021   Secondary osteoarthritis of right ankle 09/03/2021   Primary osteoarthritis of left hand 06/01/2021   Bilateral hand numbness 03/09/2021   Restrictive lung disease 11/02/2020   Benign paroxysmal positional vertigo, left 08/18/2020  HLD (hyperlipidemia) 07/10/2020   Abnormal weight loss 07/10/2020   Pulmonary emphysema (HCC) 06/09/2020   Cervical fusion syndrome 09/29/2019   Cyst of finger of right hand  07/12/2019   Stress 06/28/2019   Essential hypertension 02/25/2019   Chronic diarrhea 01/21/2019   Chondromalacia of patella, 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 [place] [address] Phone: [phone] Fax: [phone]     Social Drivers of Health (SDOH) Social History: SDOH Screenings   Food Insecurity: No Food Insecurity (07/11/2023)  Housing: Low Risk  (07/11/2023)  Transportation Needs: No Transportation Needs (07/11/2023)  Utilities: Not At Risk (07/11/2023)  Depression (PHQ2-9): High Risk (10/07/2022)  Social Connections: Socially Isolated (07/11/2023)  Tobacco Use: High Risk (07/11/2023)   SDOH Interventions:     Readmission Risk Interventions     No data to display

## 2023-07-14 NOTE — Plan of Care (Signed)
   Problem: Education: Goal: Knowledge of General Education information will improve Description: Including pain rating scale, medication(s)/side effects and non-pharmacologic comfort measures Outcome: Progressing   Problem: Clinical Measurements: Goal: Respiratory complications will improve Outcome: Progressing

## 2023-07-14 NOTE — Plan of Care (Signed)
 SATURATION QUALIFICATIONS: (This note is used to comply with regulatory documentation for home oxygen)  Patient Saturations on Room Air at Rest = 92%  Patient Saturations on Room Air while Ambulating = 86%  Patient Saturations on 2 Liters of oxygen while Ambulating = 94%  Please briefly explain why patient needs home oxygen:   Pt unable to keep saturations above 90% while walking with just room air.  Pt needs at least 2L of Oxygen while ambulating to keep saturations above 90%.

## 2023-07-14 NOTE — TOC Transition Note (Signed)
 Transition of Care [facility]) - Discharge Note   Patient Details  Name: Sherri [name] MRN: [id number] Date of Birth: 02-22-1955  Transition of Care [facility]) CM/SW Contact:  Doneta Glenys DASEN, RN Phone Number: 07/14/2023, 12:39 PM   Clinical Narrative:    [employer] has delivered oxygen to patients room.  TOC signing off   Final next level of care: Home/Self Care Barriers to Discharge: No Barriers Identified   Patient Goals and CMS Choice Patient states their goals for this hospitalization and ongoing recovery are:: Home with spouse CMS Medicare.gov Compare Post Acute Care list provided to::  (NA) Choice offered to / list presented to : Patient [place] ownership interest in [facility].provided to:: Parent NA    Discharge Placement                       Discharge Plan and Services Additional resources added to the After Visit Summary for   In-house Referral: NA Discharge Planning Services: CM Consult Post Acute Care Choice: NA          DME Arranged: Oxygen DME Agency: [employer] Date DME Agency Contacted: 07/14/23 Time DME Agency Contacted: 1215 Representative spoke with at DME Agency: [place] HH Arranged: NA HH Agency: NA        Social Drivers of Health (SDOH) Interventions SDOH Screenings   Food Insecurity: No Food Insecurity (07/11/2023)  Housing: Low Risk  (07/11/2023)  Transportation Needs: No Transportation Needs (07/11/2023)  Utilities: Not At Risk (07/11/2023)  Depression (PHQ2-9): High Risk (10/07/2022)  Social Connections: Socially Isolated (07/11/2023)  Tobacco Use: High Risk (07/11/2023)     Readmission Risk Interventions     No data to display

## 2023-07-14 NOTE — Discharge Summary (Signed)
 Physician Discharge Summary  Sherri [name] FMW:[id number] DOB: 1955-01-24 DOA: 07/11/2023  PCP: [name], MD  Admit date: 07/11/2023 Discharge date: 07/14/2023  Admitted From: Home Discharge disposition: Home with home oxygen  Recommendations at discharge:  Complete the course of antibiotics with 3 more days of azithromycin  You have been started on a tapering course of prednisone  Stop smoking  Outpatient referral to pulmonologist given   Brief narrative: Sherri [name] is a 68 y.o. female with PMH significant for HTN, HLD, COPD, anxiety, depression. 6/27, patient presented to the ED with complaint of progressively worsening congestion, shortness of breath, wheezing, generalized weakness for 4 days. Not on supplemental oxygen at home Continues to smoke, 5 to 6 cigarettes a day  In the ED, patient was afebrile, hemodynamic stable, believing 2 L oxygen Labs with WBC, hemoglobin normal, renal function normal CT chest showed 1. Very mild posteromedial right upper lobe, posterior right middle lobe and posterior bibasilar linear scarring and/or atelectasis. 2. 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  Patient was started on IV Solu-Medrol , breathing treatment.   Admitted to [facility]  Subjective: Patient was seen and examined this morning.   Sitting up at the edge of the bed.  Very eager to go home.  Has mild wheezing but much better than yesterday.  On low-flow oxygen.  Husband at bedside. Counseled again to stop smoking.  Hospital course: Acute COPD exacerbation Acute hypoxic respiratory failure Presented with progressively worsening shortness of breath, wheezing for last 4 days No fever, WBC count normal, procalcitonin level normal, compensated chronic CO2 retention on VBG CT chest without any acute infiltrates, chronic scarring Patient was started on IV Solu-Medrol , breathing treatment, incentive spirometry.  Also started azithromycin  for  anti-inflammatory effect.  QTc 452 ms Because of her persistent wheezing, she was kept on IV steroids for last 3 days.  Wheezing improving. I will discharge her on tapering course of prednisone .  To continue azithromycin  for next 3 days. Also to take Mucinex for 2 weeks.  Definitely stop smoking Continue bronchodilators as before. Required supplemental oxygen on ambulation.  Home oxygen arranged Recent Labs  Lab 07/11/23 1414 07/12/23 0539  WBC 9.8 8.1  PROCALCITON  --  <0.10   Hypertension Continue amlodipine , losartan  and HCTZ as before  HLD Statin    Tension headache Intermittent  Continue Fioricet   Generalized anxiety disorder Continue Prozac , gabapentin   Chronic daily smoker Smokes 5-6 cigarettes a day Counseled to quit Nicotine patch offered   Mobility: Encourage ambulation.  Independent at baseline.  Goals of care   Code Status: Full Code   Diet:  Diet Order             Diet general           Diet regular Room service appropriate? Yes; Fluid consistency: Thin  Diet effective now                   Nutritional status:  Body mass index is 26.23 kg/m.       Wounds:  -    Discharge Exam:   Vitals:   07/13/23 2017 07/14/23 0429 07/14/23 0430 07/14/23 0740  BP: 130/71  125/79   Pulse: 68  (!) 59   Resp: 18  18   Temp: (!) 97.1 F (36.2 C)  97.7 F (36.5 C)   TempSrc: Oral  Oral   SpO2:   99% 96%  Weight:  71.5 kg    Height:  Body mass index is 26.23 kg/m.   General exam: Pleasant, elderly Caucasian female Skin: No rashes, lesions or ulcers. HEENT: Atraumatic, normocephalic, no obvious bleeding Lungs: Bilateral wheezing much improved CVS: S1, S2, no murmur,   GI/Abd: Soft, nontender, nondistended, bowel sound present,   CNS: Alert, awake, oriented x 3 Psychiatry: Mood appropriate,  Extremities: No pedal edema, no calf tenderness,   Follow ups:    Follow-up Information     [name], MD Follow up.    Specialty: Family Medicine Contact information: [address] [place] [address] [phone]         [facility] Follow up.   Specialty: Pulmonology Contact information: [address] [place] [address] [place]  [address] [phone] Additional information: [address]  [address]  [place], [place] [address]                Discharge Instructions:   Discharge Instructions     Call MD for:  difficulty breathing, headache or visual disturbances   Complete by: As directed    Call MD for:  extreme fatigue   Complete by: As directed    Call MD for:  hives   Complete by: As directed    Call MD for:  persistant dizziness or light-headedness   Complete by: As directed    Call MD for:  persistant nausea and vomiting   Complete by: As directed    Call MD for:  severe uncontrolled pain   Complete by: As directed    Call MD for:  temperature >100.4   Complete by: As directed    Diet general   Complete by: As directed    Discharge instructions   Complete by: As directed    Recommendations at discharge:   Complete the course of antibiotics with 3 more days of azithromycin   You have been started on a tapering course of prednisone   Stop smoking   Outpatient referral to pulmonologist given  General discharge instructions: Follow with Primary MD [name], MD in 7 days  Please request your PCP  to go over your hospital tests, procedures, radiology results at the follow up. Please get your medicines reviewed and adjusted.  Your PCP may decide to repeat certain labs or tests as needed. Do not drive, operate heavy machinery, perform activities at heights, swimming or participation in water activities or provide baby sitting services if your were admitted for syncope or siezures until you have seen by Primary MD or a Neurologist and advised to do so again. [place]  Controlled Substance  Reporting System database was reviewed. Do not drive, operate heavy machinery, perform activities at heights, swim, participate in water activities or provide baby-sitting services while on medications for pain, sleep and mood until your outpatient physician has reevaluated you and advised to do so again.  You are strongly recommended to comply with the dose, frequency and duration of prescribed medications. Activity: As tolerated with Full fall precautions use walker/cane & assistance as needed Avoid using any recreational substances like cigarette, tobacco, alcohol, or non-prescribed drug. If you experience worsening of your admission symptoms, develop shortness of breath, life threatening emergency, suicidal or homicidal thoughts you must seek medical attention immediately by calling 911 or calling your MD immediately  if symptoms less severe. You must read complete instructions/literature along with all the possible adverse reactions/side effects for all the medicines you take and that have been prescribed to you.  Take any new medicine only after you have completely understood and accepted all the possible adverse reactions/side effects.  Wear Seat belts while driving. You were cared for by a hospitalist during your hospital stay. If you have any questions about your discharge medications or the care you received while you were in the hospital after you are discharged, you can call the unit and ask to speak with the hospitalist or the covering physician. Once you are discharged, your primary care physician will handle any further medical issues. Please note that NO REFILLS for any discharge medications will be authorized once you are discharged, as it is imperative that you return to your primary care physician (or establish a relationship with a primary care physician if you do not have one).   Increase activity slowly   Complete by: As directed    Pulmonary Visit   Complete by: As directed    COPD    Reason for referral: Other Pulmonary       Discharge Medications:   Allergies as of 07/14/2023       Reactions   Penicillins Hives   REACTION: hives   Hydrocodone  Nausea And Vomiting        Medication List     TAKE these medications    albuterol  108 (90 Base) MCG/ACT inhaler Commonly known as: VENTOLIN  HFA TAKE 2 PUFFS BY MOUTH EVERY 6 HOURS AS NEEDED FOR WHEEZE OR SHORTNESS OF BREATH   amLODipine  2.5 MG tablet Commonly known as: NORVASC  TAKE 1 TABLET BY MOUTH EVERY DAY   atorvastatin  40 MG tablet Commonly known as: LIPITOR TAKE 1 TABLET BY MOUTH EVERYDAY AT BEDTIME   azithromycin  500 MG tablet Commonly known as: ZITHROMAX  MAY ADMINISTER WITHOUT REGARD TO MEALS   celecoxib  200 MG capsule Commonly known as: CELEBREX  ONE TO 2 TABLETS BY MOUTH DAILY AS NEEDED FOR PAIN.   diclofenac  Sodium 1 % Gel Commonly known as: VOLTAREN  Apply 2 g topically 4 (four) times daily. To affected joint.   FLUoxetine  20 MG capsule Commonly known as: PROZAC  TAKE 3 CAPSULES BY MOUTH EVERY DAY   gabapentin  600 MG tablet Commonly known as: NEURONTIN  1200 mg in the morning, 1800 mg in the evening   guaiFENesin 600 MG 12 hr tablet Commonly known as: MUCINEX Take 2 tablets (1,200 mg total) by mouth 2 (two) times daily for 14 days.   ipratropium-albuterol  0.5-2.5 (3) MG/3ML Soln Commonly known as: DUONEB INHALE 3 ML BY NEBULIZER EVERY 6 HOURS AS NEEDED   losartan -hydrochlorothiazide  100-25 MG tablet Commonly known as: HYZAAR TAKE 1 TABLET BY MOUTH EVERY DAY   montelukast  10 MG tablet Commonly known as: SINGULAIR  TAKE 1 TABLET BY MOUTH EVERYDAY AT BEDTIME   Nebulizer Mask Adult Misc 1 Device by Does not apply route as needed.   Full Kit Nebulizer Set Misc Nebulizer machine of patient's choice, please include tubes and hoses as needed.  Use as directed.   predniSONE  10 MG tablet Commonly known as: DELTASONE  4 tabs twice daily X 3 days -->4 tabs daily X 3 days-->3 tabs  daily X 3 days--> 2 tabs daily X 3 days--> 1 tab daily X 3 days, then STOP.   traMADol  50 MG tablet Commonly known as: ULTRAM  Take 1 tablet (50 mg total) by mouth every 8 (eight) hours as needed for moderate pain (pain score 4-6).   umeclidinium-vilanterol 62.5-25 MCG/ACT Aepb Commonly known as: ANORO ELLIPTA  Inhale 1 puff into the lungs daily at 6 (six) AM.  Durable Medical Equipment  (From admission, onward)           Start     Ordered   07/14/23 0812  For home use only DME oxygen  Once       Question Answer Comment  Length of Need Lifetime   Mode or (Route) Nasal cannula   Liters per Minute 2   Frequency Continuous (stationary and portable oxygen unit needed)   Oxygen conserving device Yes   Oxygen delivery system Gas      07/14/23 0811   07/13/23 1042  For home use only DME oxygen  Once       Question Answer Comment  Length of Need Lifetime   Mode or (Route) Nasal cannula   Frequency Continuous (stationary and portable oxygen unit needed)   Oxygen conserving device Yes   Oxygen delivery system Gas      07/13/23 1041             The results of significant diagnostics from this hospitalization (including imaging, microbiology, ancillary and laboratory) are listed below for reference.    Procedures and Diagnostic Studies:   CT Chest Wo Contrast Result Date: 07/11/2023 CLINICAL DATA:  Shortness of breath and productive cough. EXAM: CT CHEST WITHOUT CONTRAST TECHNIQUE: Multidetector CT imaging of the chest was performed following the standard protocol without IV contrast. RADIATION DOSE REDUCTION: This exam was performed according to the departmental dose-optimization program which includes automated exposure control, adjustment of the mA and/or kV according to patient size and/or use of iterative reconstruction technique. COMPARISON:  October 24, 2020 FINDINGS: Cardiovascular: No significant vascular findings. Normal heart size with mild coronary  artery calcification. No pericardial effusion. Mediastinum/Nodes: No enlarged mediastinal or axillary lymph nodes. Thyroid gland, trachea, and esophagus demonstrate no significant findings. Lungs/Pleura: Very mild posteromedial right upper lobe, posterior right middle lobe and posterior bibasilar linear scarring and/or atelectasis is seen. No acute infiltrate, pleural effusion or pneumothorax is identified. Upper Abdomen: No acute abnormality. Musculoskeletal: A chronic compression fracture deformity is seen at the level of T12. IMPRESSION: 1. Very mild posteromedial right upper lobe, posterior right middle lobe and posterior bibasilar linear scarring and/or atelectasis. 2. 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 Electronically Signed   By: Suzen Dials M.D.   On: 07/11/2023 17:40   DG Chest Portable 1 View Result Date: 07/11/2023 CLINICAL DATA:  Shortness of breath. EXAM: PORTABLE CHEST 1 VIEW COMPARISON:  October 07, 2022. FINDINGS: The heart size and mediastinal contours are within normal limits. Both lungs are clear. The visualized skeletal structures are unremarkable. IMPRESSION: No active disease. Electronically Signed   By: Lynwood Landy Raddle M.D.   On: 07/11/2023 14:15     Labs:   Basic Metabolic Panel: Recent Labs  Lab 07/11/23 1414 07/12/23 0539  NA 137 137  K 3.8 3.6  CL 95* 99  CO2 29 27  GLUCOSE 93 171*  BUN 14 15  CREATININE 0.92 0.85  CALCIUM  9.8 9.0  MG  --  2.1  PHOS  --  3.4   GFR Estimated Creatinine Clearance: 61.9 mL/min (by C-G formula based on SCr of 0.85 mg/dL). Liver Function Tests: Recent Labs  Lab 07/12/23 0539  AST 34  ALT 37  ALKPHOS 81  BILITOT 0.5  PROT 6.5  ALBUMIN 3.7   No results for input(s): LIPASE, AMYLASE in the last 168 hours. No results for input(s): AMMONIA in the last 168 hours. Coagulation profile No results for input(s): INR, PROTIME in  the last 168 hours.  CBC: Recent Labs  Lab 07/11/23 1414  07/12/23 0539  WBC 9.8 8.1  NEUTROABS  --  7.5  HGB 13.9 13.0  HCT 40.6 39.6  MCV 100.2* 104.5*  PLT 268 251   Cardiac Enzymes: No results for input(s): CKTOTAL, CKMB, CKMBINDEX, TROPONINI in the last 168 hours. BNP: Invalid input(s): POCBNP CBG: No results for input(s): GLUCAP in the last 168 hours. D-Dimer No results for input(s): DDIMER in the last 72 hours. Hgb A1c No results for input(s): HGBA1C in the last 72 hours. Lipid Profile No results for input(s): CHOL, HDL, LDLCALC, TRIG, CHOLHDL, LDLDIRECT in the last 72 hours. Thyroid function studies No results for input(s): TSH, T4TOTAL, T3FREE, THYROIDAB in the last 72 hours.  Invalid input(s): FREET3 Anemia work up No results for input(s): VITAMINB12, FOLATE, FERRITIN, TIBC, IRON, RETICCTPCT in the last 72 hours. Microbiology Recent Results (from the past 240 hours)  Resp panel by RT-PCR (RSV, Flu A&B, Covid) Anterior Nasal Swab     Status: None   Collection Time: 07/11/23  2:55 PM   Specimen: Anterior Nasal Swab  Result Value Ref Range Status   SARS Coronavirus 2 by RT PCR NEGATIVE NEGATIVE Final    Comment: (NOTE) SARS-CoV-2 target nucleic acids are NOT DETECTED.  The SARS-CoV-2 RNA is generally detectable in upper respiratory specimens during the acute phase of infection. The lowest concentration of SARS-CoV-2 viral copies this assay can detect is 138 copies/mL. A negative result does not preclude SARS-Cov-2 infection and should not be used as the sole basis for treatment or other patient management decisions. A negative result may occur with  improper specimen collection/handling, submission of specimen other than nasopharyngeal swab, presence of viral mutation(s) within the areas targeted by this assay, and inadequate number of viral copies(<138 copies/mL). A negative result must be combined with clinical observations, patient history, and  epidemiological information. The expected result is Negative.  Fact Sheet for Patients:  BloggerCourse.com  Fact Sheet for Healthcare Providers:  SeriousBroker.it  This test is no t yet approved or cleared by the [place]  FDA and  has been authorized for detection and/or diagnosis of SARS-CoV-2 by FDA under an Emergency Use Authorization (EUA). This EUA will remain  in effect (meaning this test can be used) for the duration of the COVID-19 declaration under Section 564(b)(1) of the Act, 21 U.S.C.section 360bbb-3(b)(1), unless the authorization is terminated  or revoked sooner.       Influenza A by PCR NEGATIVE NEGATIVE Final   Influenza B by PCR NEGATIVE NEGATIVE Final    Comment: (NOTE) The Xpert Xpress SARS-CoV-2/FLU/RSV plus assay is intended as an aid in the diagnosis of influenza from Nasopharyngeal swab specimens and should not be used as a sole basis for treatment. Nasal washings and aspirates are unacceptable for Xpert Xpress SARS-CoV-2/FLU/RSV testing.  Fact Sheet for Patients: BloggerCourse.com  Fact Sheet for Healthcare Providers: SeriousBroker.it  This test is not yet approved or cleared by the [place]  FDA and has been authorized for detection and/or diagnosis of SARS-CoV-2 by FDA under an Emergency Use Authorization (EUA). This EUA will remain in effect (meaning this test can be used) for the duration of the COVID-19 declaration under Section 564(b)(1) of the Act, 21 U.S.C. section 360bbb-3(b)(1), unless the authorization is terminated or revoked.     Resp Syncytial Virus by PCR NEGATIVE NEGATIVE Final    Comment: (NOTE) Fact Sheet for Patients: BloggerCourse.com  Fact Sheet for Healthcare Providers: SeriousBroker.it  This test is  not yet approved or cleared by the [place]  FDA and has been  authorized for detection and/or diagnosis of SARS-CoV-2 by FDA under an Emergency Use Authorization (EUA). This EUA will remain in effect (meaning this test can be used) for the duration of the COVID-19 declaration under Section 564(b)(1) of the Act, 21 U.S.C. section 360bbb-3(b)(1), unless the authorization is terminated or revoked.  Performed at [facility], [address] [place], [place] [address]     Time coordinating discharge: 45 minutes  Signed: Chapman Rota  [facility] 07/14/2023, 11:04 AM

## 2023-07-14 NOTE — Plan of Care (Addendum)
 Pt received, educated on, and understands discharge AVS instructions.  Pt has all belongings at bedside.  All pt IV accesses discontinued.  Awaiting oxygen delivery and any other TOC needs if needed  ----  Oxygen delivered to bedside, pt understands how to use oxygen tank and how to set up oxygen at home.

## 2023-07-15 ENCOUNTER — Telehealth: Payer: Self-pay | Admitting: *Deleted

## 2023-07-15 NOTE — Transitions of Care (Post Inpatient/ED Visit) (Signed)
 07/15/2023  Name: Sherri [name] MRN: [id number] DOB: 1955-12-10  Today's TOC FU Call Status: Today's TOC FU Call Status:: Successful TOC FU Call Completed TOC FU Call Complete Date: 07/15/23 Patient's Name and Date of Birth confirmed.  Transition Care Management Follow-up Telephone Call Date of Discharge: 07/14/23 Discharge Facility: [facility]) Type of Discharge: Inpatient Admission Primary Inpatient Discharge Diagnosis:: COPD exacerbation How have you been since you were released from the hospital?: Better (Just hard getting use to oxygen) Any questions or concerns?: No  Items Reviewed: Did you receive and understand the discharge instructions provided?: Yes Medications obtained,verified, and reconciled?: Yes (Medications Reviewed) Any new allergies since your discharge?: No Dietary orders reviewed?: No Do you have support at home?: Yes People in Home [RPT]: spouse Name of Support/Comfort Primary Source: [name]  Medications Reviewed Today: Medications Reviewed Today     Reviewed by Kennieth Cathlean DEL, RN (Case Manager) on 07/15/23 at 1016  Med List Status: <None>   Medication Order Taking? Sig Documenting Provider Last Dose Status Informant  albuterol  (VENTOLIN  HFA) 108 (90 Base) MCG/ACT inhaler [id number] Yes TAKE 2 PUFFS BY MOUTH EVERY 6 HOURS AS NEEDED FOR WHEEZE OR SHORTNESS OF BREATH Bevin, Erika S, DO  Active Self  amLODipine  (NORVASC ) 2.5 MG tablet [id number] Yes TAKE 1 TABLET BY MOUTH EVERY DAY [name], MD  Active Self  atorvastatin  (LIPITOR) 40 MG tablet [id number] Yes TAKE 1 TABLET BY MOUTH EVERYDAY AT BEDTIME [name], MD  Active Self  azithromycin  (ZITHROMAX ) 500 MG tablet [id number] Yes MAY ADMINISTER WITHOUT REGARD TO MEALS Dahal, Chapman, MD  Active   celecoxib  (CELEBREX ) 200 MG capsule [id number] Yes ONE TO 2 TABLETS BY MOUTH DAILY AS NEEDED FOR PAIN. Curtis Debby PARAS, MD  Active Self  diclofenac  Sodium (VOLTAREN ) 1 % GEL  [id number] Yes Apply 2 g topically 4 (four) times daily. To affected joint. Curtis Debby PARAS, MD  Active Self  FLUoxetine  (PROZAC ) 20 MG capsule [id number] Yes TAKE 3 CAPSULES BY MOUTH EVERY DAY [name], MD  Active Self  gabapentin  (NEURONTIN ) 600 MG tablet [id number] Yes 1200 mg in the morning, 1800 mg in the evening Curtis Debby PARAS, MD  Active Self  guaiFENesin (MUCINEX) 600 MG 12 hr tablet [id number] Yes Take 2 tablets (1,200 mg total) by mouth 2 (two) times daily for 14 days. Arlice Chapman, MD  Active   ipratropium-albuterol  (DUONEB) 0.5-2.5 (3) MG/3ML SOLN [id number] Yes INHALE 3 ML BY NEBULIZER EVERY 6 HOURS AS NEEDED [name], MD  Active Self  losartan -hydrochlorothiazide  (HYZAAR) 100-25 MG tablet [id number] Yes TAKE 1 TABLET BY MOUTH EVERY DAY [name], MD  Active Self  montelukast  (SINGULAIR ) 10 MG tablet [id number] Yes TAKE 1 TABLET BY MOUTH EVERYDAY AT BEDTIME Metheney, Catherine D, MD  Active Self  predniSONE  (DELTASONE ) 10 MG tablet [id number] Yes 4 tabs twice daily X 3 days -->4 tabs daily X 3 days-->3 tabs daily X 3 days--> 2 tabs daily X 3 days--> 1 tab daily X 3 days, then STOP. Arlice Chapman, MD  Active   Respiratory Therapy Supplies (FULL KIT NEBULIZER SET) MISC [id number] Yes Nebulizer machine of patient's choice, please include tubes and hoses as needed.  Use as directed. Bevin Bernice RAMAN, DO  Active Self  Respiratory Therapy Supplies (NEBULIZER MASK ADULT) MISC [id number] Yes 1 Device by Does not apply route as needed. Bevin Bernice RAMAN, DO  Active Self  traMADol  (ULTRAM ) 50 MG tablet [id number]  Take 1 tablet (50 mg  total) by mouth every 8 (eight) hours as needed for moderate pain (pain score 4-6).  Patient not taking: Reported on 07/15/2023   Curtis Debby PARAS, MD  Active Self  umeclidinium-vilanterol (ANORO ELLIPTA ) 62.5-25 MCG/ACT AEPB [id number] Yes Inhale 1 puff into the lungs daily at 6 (six) AM. [name], MD  Active Self             Home Care and Equipment/Supplies: Were Home Health Services Ordered?: NA Any new equipment or medical supplies ordered?: Yes Name of Medical supply agency?: [employer] ([employer]xygen) Were you able to get the equipment/medical supplies?: Yes Do you have any questions related to the use of the equipment/supplies?: No  Functional Questionnaire: Do you need assistance with bathing/showering or dressing?: No Do you need assistance with meal preparation?: No Do you need assistance with eating?: No Do you have difficulty maintaining continence: No Do you need assistance with getting out of bed/getting out of a chair/moving?: No Do you have difficulty managing or taking your medications?: No  Follow up appointments reviewed: PCP Follow-up appointment confirmed?: Yes Date of PCP follow-up appointment?: 07/16/23 Follow-up Provider: Dorothyann Alvan Specialist [facility] Follow-up appointment confirmed?: No Reason Specialist Follow-Up Not Confirmed: Patient has Specialist Provider Number and will Call for Appointment (Patient is follow up [place] pulmonary care) Do you need transportation to your follow-up appointment?: No Do you understand care options if your condition(s) worsen?: Yes-patient verbalized understanding  SDOH Interventions Today    Flowsheet Row Most Recent Value  SDOH Interventions   Food Insecurity Interventions Intervention Not Indicated  Housing Interventions Intervention Not Indicated  Transportation Interventions Intervention Not Indicated, Patient Resources (Friends/Family)  Ples can provide transportation]  Utilities Interventions Intervention Not Indicated  Depression Interventions/Treatment  --  [Prozac ]    Goals Addressed             This Visit's Progress    VBCI Transitions of Care (TOC) Care Plan       Problems:  Recent Hospitalization for treatment of COPD SDOH barrier: Tobacco abuse  Goal:  Over the next 30 days, the patient will not  experience hospital readmission Stop smoking  Interventions:   COPD Interventions: Advised patient to track and manage COPD triggers Assessed social determinant of health barriers Use of home oxygen  Patient Self Care Activities:  Attend all scheduled provider appointments Call pharmacy for medication refills 3-7 days in advance of running out of medications Call provider office for new concerns or questions  Notify RN Care Manager of [facility] call rescheduling needs Participate in Transition of Care Program/Attend TOC scheduled calls Take medications as prescribed   eliminate smoking in my home identify and avoid work-related triggers eliminate symptom triggers at home keep follow-up appointments: [id number] Wear oxygen as directed  Plan:  An initial telephone outreach has been scheduled for: [id number] Next PCP appointment scheduled for: [id number] Telephone follow up appointment with care management team member scheduled for:  [id number] Shona Prow 11:00       RN discussed Oxygen safety. RN discussed tobacco cessation Cathlean Headland BSN RN [facility] Care Management Coordinator [email or web address] Direct Dial: [phone]  Fax: [phone] Website: [email or web address]

## 2023-07-16 ENCOUNTER — Telehealth: Payer: Self-pay

## 2023-07-16 ENCOUNTER — Ambulatory Visit (INDEPENDENT_AMBULATORY_CARE_PROVIDER_SITE_OTHER): Admitting: Family Medicine

## 2023-07-16 ENCOUNTER — Other Ambulatory Visit: Payer: Self-pay | Admitting: Family Medicine

## 2023-07-16 ENCOUNTER — Encounter: Payer: Self-pay | Admitting: Family Medicine

## 2023-07-16 VITALS — BP 154/67 | HR 68 | Ht 65.0 in | Wt 157.0 lb

## 2023-07-16 DIAGNOSIS — J439 Emphysema, unspecified: Secondary | ICD-10-CM

## 2023-07-16 DIAGNOSIS — J441 Chronic obstructive pulmonary disease with (acute) exacerbation: Secondary | ICD-10-CM | POA: Diagnosis not present

## 2023-07-16 DIAGNOSIS — I10 Essential (primary) hypertension: Secondary | ICD-10-CM | POA: Diagnosis not present

## 2023-07-16 MED ORDER — ALBUTEROL SULFATE HFA 108 (90 BASE) MCG/ACT IN AERS: INHALATION_SPRAY | RESPIRATORY_TRACT | 1 refills | Status: AC

## 2023-07-16 MED ORDER — CEFDINIR 300 MG PO CAPS
300.0000 mg | ORAL_CAPSULE | Freq: Two times a day (BID) | ORAL | 0 refills | Status: DC
Start: 2023-07-16 — End: 2023-08-01

## 2023-07-16 MED ORDER — AMLODIPINE BESYLATE 2.5 MG PO TABS: 2.5000 mg | ORAL_TABLET | Freq: Every day | ORAL | 1 refills | Status: DC

## 2023-07-16 MED ORDER — UMECLIDINIUM-VILANTEROL 62.5-25 MCG/ACT IN AEPB: 1.0000 | INHALATION_SPRAY | Freq: Every day | RESPIRATORY_TRACT | 6 refills | Status: DC

## 2023-07-16 NOTE — Progress Notes (Addendum)
 Established Patient Office Visit  Subjective  Patient ID: Sherri [name], female    DOB: 06-24-1955  Age: 68 y.o. MRN: [id number]  Chief Complaint  Patient presents with   Hospitalization Follow-up    HPI Follow-up recent acute exacerbation of COPD.  She has a history of pulmonary emphysema as well as some restrictive lung disease.  She started having increased shortness of breath and cough.  So her sister took her to urgent care on June 27 and they ended up sending her to the emergency department.  She was not previously on oxygen but was hypoxemic so they started her on low-flow oxygen and eventually discharged her home on 2 L.  She has been using her albuterol  neb every 4 hours at home she has her mother's old machine but she says it is making a weird noise.  She has not had her own machine.  They discharged her home as well on azithromycin  she still has 1 dose left and a few more days on the steroid taper as well she did get some Mucinex she went without it for a couple of days but was able to get a prescription yesterday so started that as well she is completely quit smoking since she was in the hospital.  She did let me know that she had been having trouble getting her Norco filled and so had not actually been on it for quite some time.     ROS    Objective:     BP (!) 154/67   Pulse 68   Ht 5' 5 (1.651 m)   Wt 157 lb (71.2 kg)   SpO2 95% Comment: 2L  BMI 26.13 kg/m    Physical Exam Vitals and nursing note reviewed.  Constitutional:      Appearance: Normal appearance.  HENT:     Head: Normocephalic and atraumatic.  Eyes:     Conjunctiva/sclera: Conjunctivae normal.  Cardiovascular:     Rate and Rhythm: Normal rate and regular rhythm.  Pulmonary:     Effort: Pulmonary effort is normal.     Breath sounds: Wheezing present.     Comments: Diffuse wheezing Skin:    General: Skin is warm and dry.  Neurological:     Mental Status: She is alert.  Psychiatric:         Mood and Affect: Mood normal.      No results found for any visits on 07/16/23.    The ASCVD Risk score (Arnett DK, et al., 2019) failed to calculate for the following reasons:   The valid HDL cholesterol range is 20 to 100 mg/dL    Assessment & Plan:   Problem List Items Addressed This Visit       Cardiovascular and Mediastinum   Essential hypertension - Primary   Blood pressure little elevated today normally well-controlled she is back on her blood pressure pills.  Her sister is helping her to make sure that she is taking them consistently.  Will monitor.      Relevant Medications   amLODipine  (NORVASC ) 2.5 MG tablet     Respiratory   Pulmonary emphysema (HCC)   Resend Anoro it looks like the previous prescription had expired last year so we will try again this year if it is not covered we might be able to find something that is.  I did encouraged her to speak with the pharmacist about it.      Relevant Medications   umeclidinium-vilanterol (ANORO ELLIPTA ) 62.5-25 MCG/ACT AEPB  albuterol  (VENTOLIN  HFA) 108 (90 Base) MCG/ACT inhaler   CHRONIC OBSTRUCTIVE PULMONARY DISEASE, ACUTE EXACERBATION   With albuterol  nebs every 4 hours and if she is starting to feel better as this week progresses then can start spacing to every 6 hours, and then every 8 hours.  I would like to get her a new nebulizer that is hers and not an old one that would long to her mom that is making some unusual noises.  She says she does have the tubing.  I will try to get her back on her Anoro.  I want her to finish up her prednisone  taper and finish out the azithromycin  she has had a lot of sinus drainage and congestion some going to put her on doxycycline       Relevant Medications   umeclidinium-vilanterol (ANORO ELLIPTA ) 62.5-25 MCG/ACT AEPB   albuterol  (VENTOLIN  HFA) 108 (90 Base) MCG/ACT inhaler   Also congratulated her on quitting smoking and just really gave her encouragement.  Her sister has  been really supportive as well.  If at any point she feels like she is really struggling we could look at medication options to help her.  Return in about 1 month (around 08/16/2023) for copd, WALK TEST .    Dorothyann Byars, MD

## 2023-07-16 NOTE — Assessment & Plan Note (Signed)
 With albuterol  nebs every 4 hours and if she is starting to feel better as this week progresses then can start spacing to every 6 hours, and then every 8 hours.  I would like to get her a new nebulizer that is hers and not an old one that would long to her mom that is making some unusual noises.  She says she does have the tubing.  I will try to get her back on her Anoro.  I want her to finish up her prednisone  taper and finish out the azithromycin  she has had a lot of sinus drainage and congestion some going to put her on doxycycline 

## 2023-07-16 NOTE — Telephone Encounter (Signed)
 Copied from CRM [id number]. Topic: Clinical - Order For Equipment >> Jul 16, 2023 12:13 PM Fredrica W wrote: Reason for CRM: Patient called was just seen by provider. Was told to call back to provider name of company they use for oxygen supplies. Provided [employer] in [employer] Phone: [phone]. Thank You

## 2023-07-16 NOTE — Assessment & Plan Note (Signed)
 Blood pressure little elevated today normally well-controlled she is back on her blood pressure pills.  Her sister is helping her to make sure that she is taking them consistently.  Will monitor.

## 2023-07-16 NOTE — Assessment & Plan Note (Signed)
 Resend Anoro it looks like the previous prescription had expired last year so we will try again this year if it is not covered we might be able to find something that is.  I did encouraged her to speak with the pharmacist about it.

## 2023-07-18 NOTE — Telephone Encounter (Signed)
Please start PA for medication.

## 2023-07-19 ENCOUNTER — Other Ambulatory Visit: Payer: Self-pay | Admitting: Family Medicine

## 2023-07-21 ENCOUNTER — Encounter: Payer: Self-pay | Admitting: Sports Medicine

## 2023-07-21 ENCOUNTER — Ambulatory Visit (INDEPENDENT_AMBULATORY_CARE_PROVIDER_SITE_OTHER): Admitting: Sports Medicine

## 2023-07-21 ENCOUNTER — Telehealth: Payer: Self-pay

## 2023-07-21 ENCOUNTER — Other Ambulatory Visit (HOSPITAL_COMMUNITY): Payer: Self-pay

## 2023-07-21 DIAGNOSIS — J441 Chronic obstructive pulmonary disease with (acute) exacerbation: Secondary | ICD-10-CM

## 2023-07-21 DIAGNOSIS — Q761 Klippel-Feil syndrome: Secondary | ICD-10-CM | POA: Diagnosis not present

## 2023-07-21 MED ORDER — HYDROCODONE BIT-HOMATROP MBR 5-1.5 MG PO TABS: 1.0000 | ORAL_TABLET | Freq: Three times a day (TID) | ORAL | 0 refills | Status: DC | PRN

## 2023-07-21 NOTE — Assessment & Plan Note (Signed)
 Sherri [name] is a very pleasant 68 year old female status post C4-C6 ACDF by Dr. Malcolm in the past, known adjacent level disease C6-T1, she is on high-dose gabapentin  200 mg in the morning, 800 mg in the evening. She still had some right sided radiculitis. She did some home PT, we also ordered a cervical epidural. She returns today with near complete relief 3 weeks after the epidural, she understands we can do 3 epidurals in 6 months. She recently hospitalized for COPD exacerbation and the cough is hurting her neck so we will add some Hycodan per her request. Return to see me as needed.

## 2023-07-21 NOTE — Telephone Encounter (Signed)
 Pharmacy Patient Advocate Encounter   Received notification from RX Request Messages that prior authorization for Anoro Ellipta  62.5-25mcg is required/requested.   Insurance verification completed.   The patient is insured through [place] .   Per test claim: Refill too soon. PA is not needed at this time. Medication was filled 07/17/23. Next eligible fill date is 08/09/23.

## 2023-07-21 NOTE — Progress Notes (Signed)
    Procedures performed today:    None.  Independent interpretation of notes and tests performed by another provider:   None.  Brief History, Exam, Impression, and Recommendations:    Cervical fusion syndrome Sherri [name] is a very pleasant 68 year old female status post C4-C6 ACDF by Dr. Malcolm in the past, known adjacent level disease C6-T1, she is on high-dose gabapentin  200 mg in the morning, 800 mg in the evening. She still had some right sided radiculitis. She did some home PT, we also ordered a cervical epidural. She returns today with near complete relief 3 weeks after the epidural, she understands we can do 3 epidurals in 6 months. She recently hospitalized for COPD exacerbation and the cough is hurting her neck so we will add some Hycodan per her request. Return to see me as needed.    ____________________________________________ [name], M.D., ABFM., CAQSM., AME. Primary Care and Sports Medicine [facility]  Adjunct Professor of [facility] Medicine  [employer]  Restaurant manager, fast food

## 2023-07-22 ENCOUNTER — Other Ambulatory Visit: Payer: Self-pay

## 2023-07-22 ENCOUNTER — Telehealth: Payer: Self-pay

## 2023-07-22 NOTE — Transitions of Care (Post Inpatient/ED Visit) (Signed)
 Care Management  Transitions of Care Program Transitions of Care Post-discharge week 2  07/22/2023 Name: Sherri [name] MRN: [id number] DOB: November 12, 1955  Subjective: Sherri [name] is a 68 y.o. year old female who is a primary care patient of [name], MD. The Care Management team spoke very briefly with patient who states her air conditioner is broken and states someone is coming to fix it and suddenly call dropped. TOC RN called back and left message for patient advising that if she has needs that need to be addressed today, to call back, otherwise TOC RN will call her back tomorrow (hopefully after air conditioning has been repaired) .   Plan: Additional outreach attempts will be made to reach the patient enrolled in the [facility] Program (Post Inpatient/ED Visit).  Shona Prow RN, CCM [place]  VBCI-Population Health RN Care Manager [phone]

## 2023-07-22 NOTE — Telephone Encounter (Signed)
 Copied from CRM [id number]. Topic: Clinical - Order For Equipment >> Jul 21, 2023 10:33 AM Adrianna P wrote: Reason for CRM: Dr alvan ordered smaller oxygen tanks and they have not been delivered yet. She has half an oxygen tank left.

## 2023-07-23 ENCOUNTER — Other Ambulatory Visit: Payer: Self-pay | Admitting: *Deleted

## 2023-07-23 DIAGNOSIS — J441 Chronic obstructive pulmonary disease with (acute) exacerbation: Secondary | ICD-10-CM

## 2023-07-23 MED ORDER — NEBULIZER MASK ADULT MISC: 1.0000 | 0 refills | Status: AC | PRN

## 2023-07-23 MED ORDER — FULL KIT NEBULIZER SET MISC: 0 refills | Status: DC

## 2023-07-23 MED ORDER — FULL KIT NEBULIZER SET MISC: 0 refills | Status: AC

## 2023-07-23 MED ORDER — NEBULIZER MASK ADULT MISC: 1.0000 | 0 refills | Status: DC | PRN

## 2023-07-24 ENCOUNTER — Telehealth: Payer: Self-pay

## 2023-07-24 NOTE — Patient Instructions (Signed)
 Visit Information  Thank you for taking time to visit with me today. Please don't hesitate to contact me if I can be of assistance to you before our next scheduled telephone appointment.  Our next appointment is by telephone on 08/01/23 at 1pm  Following is a copy of your care plan:   Goals Addressed             This Visit's Progress    VBCI Transitions of Care (TOC) Care Plan       Problems:  Recent Hospitalization for treatment of COPD SDOH barrier: Tobacco abuse - Per 07/24/23 call patient reports she has only smoked 1 cigarette in the past 4 days - reviewed oxygen safety and concern of smoking with oxygen and patient states,my home is now a smoke free home and I leave the oxygen inside and went outside when I smoked  Goal:  Over the next 30 days, the patient will not experience hospital readmission Stop smoking  Interventions:   COPD Interventions: Advised patient to track and manage COPD triggers Assessed social determinant of health barriers Use of home oxygen  Patient Self Care Activities:  Attend all scheduled provider appointments Call pharmacy for medication refills 3-7 days in advance of running out of medications Call provider office for new concerns or questions  Notify RN Care Manager of TOC call rescheduling needs Participate in Transition of Care Program/Attend TOC scheduled calls Take medications as prescribed   eliminate smoking in my home identify and avoid work-related triggers eliminate symptom triggers at home keep follow-up appointments: [id number] - completed Wear oxygen as directed  Plan: Patient states her current plan is to follow up with Dr Alvan 8/14 but if Pulmonary calls to schedule, she will see when they want to schedule and if it's after her 08/28/23 appt with PCP, she will go ahead and schedule it and then discuss need with PCP. Patient not sure if she wants to drive to pulmonary or just have PCP manage COPD.   Telephone follow up  appointment with care management team member scheduled for:  08/01/23 1pm        Patient verbalizes understanding of instructions and care plan provided today and agrees to view in MyChart. Active MyChart status and patient understanding of how to access instructions and care plan via MyChart confirmed with patient.     Telephone follow up appointment with care management team member scheduled for: 08/01/23 1pm The patient has been provided with contact information for the care management team and has been advised to call with any health related questions or concerns.   Please call the care guide team at [phone] if you need to cancel or reschedule your appointment.   Please call the Suicide and Crisis Lifeline: 988 call 1-800-273-TALK (toll free, 24 hour hotline) call 911 if you are experiencing a Mental Health or Behavioral Health Crisis or need someone to talk to.  Shona Prow RN, CCM [place]  VBCI-Population Health RN Care Manager [phone]

## 2023-07-24 NOTE — Transitions of Care (Post Inpatient/ED Visit) (Signed)
 Transition of Care week 2  Visit Note  07/24/2023  Name: Sherri [name] MRN: [id number]          DOB: [birth year]  Situation: Patient enrolled in [facility] 30-day program. Visit completed with patient by telephone.   Background:   Initial Transition Care Management Follow-up Telephone Call    Past Medical History:  Diagnosis Date   Anxiety    Arthritis    Asthma    COPD (chronic obstructive pulmonary disease) (HCC)    Depression    Essential hypertension    HLD (hyperlipidemia)    Pneumonia    Recurrent upper respiratory infection (URI)     Assessment: Patient Reported Symptoms: Cognitive Cognitive Status: No symptoms reported, Normal speech and language skills, Alert and oriented to person, place, and time      Neurological Neurological Review of Symptoms: No symptoms reported    HEENT HEENT Symptoms Reported: Other: HEENT Comment: patient reports occcasional feeling of sinus drainage    Cardiovascular Cardiovascular Symptoms Reported: No symptoms reported Weight: 155 lb (70.3 kg)  Respiratory Respiratory Symptoms Reported: Shortness of breath, Dry cough Respiratory Management Strategies: Oxygen therapy, Adequate rest, Medication therapy  Endocrine Endocrine Symptoms Reported: No symptoms reported    Gastrointestinal Gastrointestinal Symptoms Reported: No symptoms reported      Genitourinary Genitourinary Symptoms Reported: No symptoms reported    Integumentary Integumentary Symptoms Reported: Other Other Integumentary Symptoms: patient reports little scrape on R arm where she bumps her arm - just uses a bandaid    Musculoskeletal          Psychosocial           There were no vitals filed for this visit.  Medications Reviewed Today     Reviewed by Lauro Shona LABOR, RN (Registered Nurse) on 07/24/23 at 1500  Med List Status: <None>   Medication Order Taking? Sig Documenting Provider Last Dose Status Informant  albuterol  (VENTOLIN  HFA) 108 (90 Base)  MCG/ACT inhaler [id number] Yes TAKE 2 PUFFS BY MOUTH EVERY 6 HOURS AS NEEDED FOR WHEEZE OR SHORTNESS OF BREATH [name], MD  Active   amLODipine  (NORVASC ) 2.5 MG tablet [id number] Yes Take 1 tablet (2.5 mg total) by mouth daily. [name], MD  Active   atorvastatin  (LIPITOR) 40 MG tablet [id number] Yes TAKE 1 TABLET BY MOUTH EVERYDAY AT BEDTIME [name], MD  Active Self  cefdinir  (OMNICEF ) 300 MG capsule [id number]  Take 1 capsule (300 mg total) by mouth 2 (two) times daily.  Patient not taking: Reported on 07/24/2023   [name], MD  Active   celecoxib  (CELEBREX ) 200 MG capsule [id number] Yes ONE TO 2 TABLETS BY MOUTH DAILY AS NEEDED FOR PAIN. Curtis Debby PARAS, MD  Active Self  diclofenac  Sodium (VOLTAREN ) 1 % GEL [id number] Yes Apply 2 g topically 4 (four) times daily. To affected joint. Curtis Debby PARAS, MD  Active Self  FLUoxetine  (PROZAC ) 20 MG capsule [id number] Yes TAKE 3 CAPSULES BY MOUTH EVERY DAY [name], MD  Active   gabapentin  (NEURONTIN ) 600 MG tablet [id number] Yes 1200 mg in the morning, 1800 mg in the evening  Patient taking differently: 1200 mg in the morning, 1800 mg in the evening  - patient states she is currently taking 2 in the morning and 2 in evening = 1200mg  twice a day   Curtis Debby PARAS, MD  Active Self  guaiFENesin  (MUCINEX ) 600 MG 12 hr tablet [id number] Yes Take 2 tablets (1,200 mg total) by mouth  2 (two) times daily for 14 days. Arlice Reichert, MD  Active   HYDROcodone  Bit-Homatrop MBr 5-1.5 MG TABS [id number]  Take 1 tablet by mouth every 8 (eight) hours as needed (cough).  Patient not taking: Reported on 07/24/2023   Curtis Debby PARAS, MD  Active   ipratropium-albuterol  (DUONEB) 0.5-2.5 (3) MG/3ML SOLN [id number] Yes INHALE 3 ML BY NEBULIZER EVERY 6 HOURS AS NEEDED [name], MD  Active Self  losartan -hydrochlorothiazide  (HYZAAR) 100-25 MG tablet [id number] Yes TAKE 1 TABLET BY MOUTH  EVERY DAY [name], MD  Active Self  montelukast  (SINGULAIR ) 10 MG tablet [id number] Yes TAKE 1 TABLET BY MOUTH EVERYDAY AT BEDTIME Metheney, Catherine D, MD  Active Self  predniSONE  (DELTASONE ) 10 MG tablet [id number] Yes 4 tabs twice daily X 3 days -->4 tabs daily X 3 days-->3 tabs daily X 3 days--> 2 tabs daily X 3 days--> 1 tab daily X 3 days, then STOP. Arlice Reichert, MD  Active   Respiratory Therapy Supplies (FULL KIT NEBULIZER SET) MISC [id number] Yes Nebulizer machine of patient's choice, please include tubes and hoses as needed.  Use as directed. [name], MD  Active   Respiratory Therapy Supplies (NEBULIZER MASK ADULT) MISC [id number] Yes 1 Device by Does not apply route as needed. [name], MD  Active   umeclidinium-vilanterol (ANORO ELLIPTA ) 62.5-25 MCG/ACT AEPB [id number] Yes Inhale 1 puff into the lungs daily at 6 (six) AM. [name], MD  Active             Recommendation:   Continue Current Plan of Care  Follow Up Plan:   Telephone follow up appointment date/time:  08/01/23 1pm  Shona Prow RN, CCM [place]  [facility]BC[facility]-Population Health RN Care Manager [phone]

## 2023-07-25 ENCOUNTER — Telehealth: Payer: Self-pay

## 2023-07-25 DIAGNOSIS — J329 Chronic sinusitis, unspecified: Secondary | ICD-10-CM

## 2023-07-25 NOTE — Telephone Encounter (Signed)
 Copied from CRM [id number]. Topic: Clinical - Medical Advice >> Jul 25, 2023  4:40 PM Adrianna P wrote: Reason for CRM: Patient thinks she is having a sinus infection, she has a nasty taste in her mouth and a headache, she wants to know if she needs to be started on a third antibiotic

## 2023-07-25 NOTE — Telephone Encounter (Signed)
 Order for O2 and nebulizer sent to [employer] [facility] via [employer]

## 2023-07-25 NOTE — Telephone Encounter (Signed)
 O2 has been ordered thru [employer] they will contact patient.

## 2023-07-28 NOTE — Telephone Encounter (Signed)
 Spoke with patient. States she  still has bad taste in mouth and still has a bad headache. States it feels like sinus infections she has had in the past but she is not sure how she could still have a sinus infection after the two previous rounds of abx.

## 2023-07-28 NOTE — Telephone Encounter (Signed)
 I just saw the message this morning.  Please call her today and see if she is feeling any better after the weekend if not then yes I can send in a third antibiotic.

## 2023-07-29 ENCOUNTER — Telehealth: Payer: Self-pay

## 2023-07-29 ENCOUNTER — Ambulatory Visit: Payer: Self-pay

## 2023-07-29 NOTE — Telephone Encounter (Signed)
 Copied from CRM [id number]. Topic: Clinical - Medical Advice >> Jul 29, 2023  8:46 AM Farrel B wrote: Patient called from [phone] patient requested a call back in regards to her the status of her order for her O2 tanks. Patient stated she has not heard anything in regards to the tank and she can keep dealing with the ones she has she has a really bad back. Please call patient at the above number listed.

## 2023-07-29 NOTE — Telephone Encounter (Signed)
 Pt states that she spoke with Luke from the clinic and was told that another abx would be called in, pt states that she has not heard from the clinic or pharm about the abx. Pt also asking if PCP has placed orders for small O2 tanks, pt states that she was told that they were ordered but has not had any delivered. Please f/u with pt.   Copied from CRM [id number]. Topic: Clinical - Prescription Issue >> Jul 29, 2023  8:54 AM Farrel B wrote: Reason for CRM: patient called from [phone] called requesting Dr. Alvan to send her a prescription for antibiotics. Not specific of the name of the antibiotics she was unsure, but she stated she needed another antibiotic called in. I asked the patient if she was needing a refill, she stated that she needed a new antibiotic. Pleaser Advise.

## 2023-07-30 NOTE — Telephone Encounter (Signed)
 Spoke w/pt and informed her that the order is processing.

## 2023-08-01 ENCOUNTER — Telehealth: Payer: Self-pay

## 2023-08-01 NOTE — Addendum Note (Signed)
 Addended by: Rokia Bosket D on: 08/01/2023 01:10 PM   Modules accepted: Orders

## 2023-08-01 NOTE — Telephone Encounter (Signed)
 I agree.  It would definitely be atypical.  Would she be willing to do a CT of her sinuses?  That way we could confirm if there is still persistent infection or if it looks like they are saying something else like a blockage or a large polyp?

## 2023-08-06 NOTE — Telephone Encounter (Signed)
 LVM asking pt to rtn call about having CT done

## 2023-08-07 NOTE — Telephone Encounter (Signed)
 CT sinuses shows scheduled for 08/11/2023

## 2023-08-11 ENCOUNTER — Ambulatory Visit

## 2023-08-11 ENCOUNTER — Telehealth: Payer: Self-pay | Admitting: *Deleted

## 2023-08-11 DIAGNOSIS — J441 Chronic obstructive pulmonary disease with (acute) exacerbation: Secondary | ICD-10-CM

## 2023-08-11 DIAGNOSIS — J329 Chronic sinusitis, unspecified: Secondary | ICD-10-CM | POA: Diagnosis not present

## 2023-08-11 DIAGNOSIS — R519 Headache, unspecified: Secondary | ICD-10-CM | POA: Diagnosis not present

## 2023-08-11 MED ORDER — IPRATROPIUM-ALBUTEROL 0.5-2.5 (3) MG/3ML IN SOLN: 3.0000 mL | Freq: Four times a day (QID) | RESPIRATORY_TRACT | 3 refills | Status: AC | PRN

## 2023-08-11 NOTE — Telephone Encounter (Signed)
 Rx for pt's duoneb has been sent to pharmacy for pt. Called and spoke with pt letting her know this had been done and she verbalized understanding. Nothing further needed.

## 2023-08-11 NOTE — Telephone Encounter (Signed)
 Copied from CRM [id number]. Topic: Clinical - Prescription Issue >> Aug 11, 2023 10:23 AM Cherylann RAMAN wrote: Reason for CRM: Patient went to pick up ipratropium-albuterol  (DUONEB) 0.5-2.5 (3) MG/3ML SOLN, however, pharmacist advised patient that she will need an updated prescription in order to pick up the medication . Please contact patient at [phone].

## 2023-08-18 ENCOUNTER — Ambulatory Visit: Payer: Self-pay | Admitting: Family Medicine

## 2023-08-18 NOTE — Progress Notes (Signed)
 HI Sherri [name],  CT of your sinuses shows your left maxillary sinus is completely full of fluid.  You have already had 3 rounds of antibiotics that did not this out then neck step would be to get you in with an ENT.  We do have [facility] here in [place] if that is convenient for you I like to see if they can get you in in the next couple of weeks if at all possible would you be okay with that?

## 2023-08-19 ENCOUNTER — Other Ambulatory Visit: Payer: Self-pay | Admitting: *Deleted

## 2023-08-19 DIAGNOSIS — J329 Chronic sinusitis, unspecified: Secondary | ICD-10-CM

## 2023-08-28 ENCOUNTER — Encounter: Payer: Self-pay | Admitting: Family Medicine

## 2023-08-28 ENCOUNTER — Ambulatory Visit (INDEPENDENT_AMBULATORY_CARE_PROVIDER_SITE_OTHER): Admitting: Family Medicine

## 2023-08-28 VITALS — BP 134/72 | HR 61 | Ht 65.0 in | Wt 157.1 lb

## 2023-08-28 DIAGNOSIS — J439 Emphysema, unspecified: Secondary | ICD-10-CM | POA: Diagnosis not present

## 2023-08-28 DIAGNOSIS — J9601 Acute respiratory failure with hypoxia: Secondary | ICD-10-CM | POA: Diagnosis not present

## 2023-08-28 DIAGNOSIS — I10 Essential (primary) hypertension: Secondary | ICD-10-CM | POA: Diagnosis not present

## 2023-08-28 DIAGNOSIS — F331 Major depressive disorder, recurrent, moderate: Secondary | ICD-10-CM | POA: Diagnosis not present

## 2023-08-28 MED ORDER — CEFDINIR 300 MG PO CAPS: 300.0000 mg | ORAL_CAPSULE | Freq: Two times a day (BID) | ORAL | 0 refills | Status: DC

## 2023-08-28 NOTE — Assessment & Plan Note (Signed)
 Blood pressure was elevated at last visit and still little elevated today they are normally it is really well-controlled we will recheck it again today before she goes.

## 2023-08-28 NOTE — Assessment & Plan Note (Signed)
   Flowsheet Row Telephone from 07/15/2023 in [place] POPULATION HEALTH DEPARTMENT  PHQ-9 Total Score 14

## 2023-08-28 NOTE — Assessment & Plan Note (Signed)
 Continue with Anoro and Singulair .  She does have her new nebulizer and has the vials for that.

## 2023-08-28 NOTE — Progress Notes (Signed)
 Established Patient Office Visit  Subjective  Patient ID: GLENDI MOHIUDDIN, female    DOB: 16-Oct-1955  Age: 68 y.o. MRN: [id number]  Chief Complaint  Patient presents with   Medical Management of Chronic Issues    HPI Please see prior note in regards to ED visit where she was placed on oxygen in July.  She is here today for follow-up.   Follow-up COPD-she was able to get the Anoro filled.  She said they finally delivered the new nebulizer it took a few weeks.  But she does have it now.  She has not heard back about the ENT referral yet they have not called her to schedule an appointment she says she is been looking out for that.  She says she feels between 50 and 70% better than when I saw her a month ago still occasional cough but no longer persistent or productive.  Follow-up major depressive disorder-currently on fluoxetine  20 mg last PHQ-9 score in July was 14.  Her sister is getting ready to have surgery next week and she will have to help her.  She is also still cooking for her grandson.  But trying to still give herself some time and space to heal.  She has a follow-up scheduled with pulmonology in about 2 weeks.  CT sinuses from August 2 showed complete opacification of the left maxillary sinus with occlusion of the left ostiomeatal complex.    ROS    Objective:     BP 134/72   Pulse 61   Ht 5' 5 (1.651 m)   Wt 157 lb 1.3 oz (71.3 kg)   SpO2 97% Comment: 2L  BMI 26.14 kg/m    Physical Exam Vitals and nursing note reviewed.  Constitutional:      Appearance: Normal appearance.  HENT:     Head: Normocephalic and atraumatic.  Eyes:     Conjunctiva/sclera: Conjunctivae normal.  Cardiovascular:     Rate and Rhythm: Normal rate and regular rhythm.  Pulmonary:     Effort: Pulmonary effort is normal.     Breath sounds: Normal breath sounds.  Skin:    General: Skin is warm and dry.  Neurological:     Mental Status: She is alert.  Psychiatric:        Mood and  Affect: Mood normal.      No results found for any visits on 08/28/23.    The ASCVD Risk score (Arnett DK, et al., 2019) failed to calculate for the following reasons:   The valid HDL cholesterol range is 20 to 100 mg/dL    Assessment & Plan:   Problem List Items Addressed This Visit       Cardiovascular and Mediastinum   Essential hypertension   Blood pressure was elevated at last visit and still little elevated today they are normally it is really well-controlled we will recheck it again today before she goes.        Respiratory   Pulmonary emphysema (HCC) - Primary   Continue with Anoro and Singulair .  She does have her new nebulizer and has the vials for that.      Acute hypoxic respiratory failure (HCC)   Actually did really well with her 6-minute walk test without oxygen today she was able to maintain into the low 90s.        Other   MDD (major depressive disorder)     Flowsheet Row Telephone from 07/15/2023 in [place] POPULATION HEALTH DEPARTMENT  PHQ-9 Total Score  14          Maxillary sinusitis on the left-she has not heard from ENT yet some to go ahead and place her on Omnicef until she gets send even though she is already had 3 rounds of antibiotics we discussed that it is important that she be evaluated to determine if there may be something blocking the sinus cavity or possibly an unusual infection or it could even be fungal which is why the antibiotics are not really helping.  They may need to do culture etc.  But I will let them determine that.  If she has not heard from them by early next week please let us  know  No follow-ups on file.    Dorothyann Byars, MD

## 2023-08-28 NOTE — Assessment & Plan Note (Signed)
 Actually did really well with her 6-minute walk test without oxygen today she was able to maintain into the low 90s.

## 2023-08-28 NOTE — Patient Instructions (Signed)
 Okay to use your oxygen more as needed with certain activities at least until you see the pulmonologist and then they can better determine whether or not you might actually be able to discontinue your oxygen.

## 2023-09-10 ENCOUNTER — Ambulatory Visit

## 2023-09-16 ENCOUNTER — Encounter: Payer: Self-pay | Admitting: Sports Medicine

## 2023-09-30 DIAGNOSIS — Z23 Encounter for immunization: Secondary | ICD-10-CM | POA: Diagnosis not present

## 2023-10-06 ENCOUNTER — Ambulatory Visit

## 2023-10-09 ENCOUNTER — Other Ambulatory Visit: Payer: Self-pay | Admitting: Sports Medicine

## 2023-10-09 DIAGNOSIS — M1612 Unilateral primary osteoarthritis, left hip: Secondary | ICD-10-CM

## 2023-10-09 NOTE — Telephone Encounter (Unsigned)
 Copied from CRM #[id number]. Topic: Clinical - Medication Refill >> Oct 09, 2023  9:24 AM Emylou G wrote: Medication: celecoxib  (CELEBREX ) 200 MG capsule  Debby JINNY Petties, MD - is the dr  Has the patient contacted their pharmacy? Yes (Agent: If no, request that the patient contact the pharmacy for the refill. If patient does not wish to contact the pharmacy document the reason why and proceed with request.) (Agent: If yes, when and what did the pharmacy advise?) said to call us   This is the patient's preferred pharmacy:  [facility] [phone] - [place], [place] - [address] [place] [address] Phone: [phone] Fax: [phone]  Is this the correct pharmacy for this prescription? Yes If no, delete pharmacy and type the correct one.   Has the prescription been filled recently? No  Is the patient out of the medication? Yes  Has the patient been seen for an appointment in the last year OR does the patient have an upcoming appointment? Yes  Can we respond through MyChart? Yes  Agent: Please be advised that Rx refills may take up to 3 business days. We ask that you follow-up with your pharmacy.

## 2023-10-10 MED ORDER — CELECOXIB 200 MG PO CAPS: ORAL_CAPSULE | ORAL | 2 refills | Status: DC

## 2023-11-06 ENCOUNTER — Other Ambulatory Visit: Payer: Self-pay | Admitting: Family Medicine

## 2023-11-06 DIAGNOSIS — I10 Essential (primary) hypertension: Secondary | ICD-10-CM

## 2023-11-13 ENCOUNTER — Other Ambulatory Visit: Payer: Self-pay | Admitting: Family Medicine

## 2023-11-26 ENCOUNTER — Ambulatory Visit: Payer: Self-pay

## 2023-11-26 NOTE — Telephone Encounter (Signed)
 FYI Only or Action Required?: FYI only for provider: appointment scheduled on 11/13.  Patient was last seen in primary care on 08/28/2023 by Sherri Dorothyann BIRCH, MD.  Called Nurse Triage reporting Shortness of Breath, Urinary Frequency, and Cough.  Symptoms began several days ago.  Interventions attempted: Prescription medications: Breathing treatments.  Symptoms are: unchanged.  Triage Disposition: See PCP When Office is Open (Within 3 Days)  Patient/caregiver understands and will follow disposition?: Yes          Copied from CRM [id number]. Topic: Clinical - Red Word Triage >> Nov 26, 2023 11:33 AM Sherri [name] wrote: Red Word that prompted transfer to Nurse Triage:  She has had upper respiratory issues June, 2025. Breathing treatment no working; it is getting worse. She also having lower back pain and pain across her lower stomach; Pain at a 5 and getting worse. Reason for Disposition  [1] MODERATE longstanding difficulty breathing (e.g., speaks in phrases, SOB even at rest, pulse 100-120) AND [2] SAME as normal  Answer Assessment - Initial Assessment Questions 1. RESPIRATORY STATUS: Describe your breathing? (e.g., wheezing, shortness of breath, unable to speak, severe coughing)      Cough, SOB from coughing. Yellow mucus noted  2. ONSET: When did this breathing problem begin?      X 5 days   3. PATTERN Does the difficult breathing come and go, or has it been constant since it started?      Intermittent   4. SEVERITY: How bad is your breathing? (e.g., mild, moderate, severe)      Mild  5. RECURRENT SYMPTOM: Have you had difficulty breathing before? If Yes, ask: When was the last time? and What happened that time?       Yes, ongoing since June 2025  6. CARDIAC HISTORY: Do you have any history of heart disease? (e.g., heart attack, angina, bypass surgery, angioplasty)      No   7. LUNG HISTORY: Do you have any history of lung disease?  (e.g.,  pulmonary embolus, asthma, emphysema)     COPD   8. CAUSE: What do you think is causing the breathing problem?      COPD, Cough   9. OTHER SYMPTOMS: Do you have any other symptoms? (e.g., chest pain, cough, dizziness, fever, runny nose)  Cough, mild dizziness noted   Breathing treatments, no longer effective. She reports chest rattling. She reports this problem happens this time of year, and PCP is aware.   Lower flank pain from possible UTI, x 2 days. Appointment scheduled for evaluation. Patient agrees with plan of care, and will call back if anything changes, or if symptoms worsen.  Protocols used: Breathing Difficulty-A-AH

## 2023-11-27 ENCOUNTER — Ambulatory Visit: Admitting: Family Medicine

## 2023-11-27 ENCOUNTER — Encounter: Payer: Self-pay | Admitting: Family Medicine

## 2023-11-27 VITALS — BP 131/71 | HR 70 | Temp 98.1°F | Ht 65.0 in | Wt 167.4 lb

## 2023-11-27 DIAGNOSIS — J329 Chronic sinusitis, unspecified: Secondary | ICD-10-CM

## 2023-11-27 DIAGNOSIS — J40 Bronchitis, not specified as acute or chronic: Secondary | ICD-10-CM

## 2023-11-27 DIAGNOSIS — Z23 Encounter for immunization: Secondary | ICD-10-CM

## 2023-11-27 DIAGNOSIS — R3989 Other symptoms and signs involving the genitourinary system: Secondary | ICD-10-CM | POA: Diagnosis not present

## 2023-11-27 LAB — POCT URINALYSIS DIP (CLINITEK)
Bilirubin, UA: NEGATIVE
Blood, UA: NEGATIVE
Glucose, UA: NEGATIVE mg/dL
Ketones, POC UA: NEGATIVE mg/dL
Leukocytes, UA: NEGATIVE
Nitrite, UA: NEGATIVE
POC PROTEIN,UA: NEGATIVE
Spec Grav, UA: 1.015 (ref 1.010–1.025)
Urobilinogen, UA: 0.2 U/dL
pH, UA: 7 (ref 5.0–8.0)

## 2023-11-27 MED ORDER — DOXYCYCLINE HYCLATE 100 MG PO TABS: 100.0000 mg | ORAL_TABLET | Freq: Two times a day (BID) | ORAL | 0 refills | Status: AC

## 2023-11-27 MED ORDER — PREDNISONE 20 MG PO TABS: 40.0000 mg | ORAL_TABLET | Freq: Every day | ORAL | 0 refills | Status: AC

## 2023-11-27 NOTE — Progress Notes (Signed)
 Pt reports cough x1 week on and off. She has been taking mucinex  and using her nebulizer. Denies f/s/c.  She also said that when her bladder is full it hurts.

## 2023-11-27 NOTE — Progress Notes (Signed)
 Acute Office Visit  Patient ID: CURTISHA BENDIX, female    DOB: [birth year], 68 y.o.   MRN: [id number]  PCP: [name], MD  Chief Complaint  Patient presents with   Cough   Cystitis    Subjective:     HPI  Discussed the use of AI scribe software for clinical note transcription with the patient, who gave verbal consent to proceed.  History of Present Illness Roslyn Else Braver Jamee is a 68 year old female who presents with respiratory symptoms and bladder discomfort.  Upper and lower respiratory symptoms - Onset approximately one week ago - Initial symptoms resembled allergic rhinitis with drainage and sore throat - Persistent poor air quality at home due to holes in basement ducts, pending repair - Changed air filter three times in the past month without improvement in air quality - Current symptoms include drainage, scratchy throat, sensation of rattling in chest - Wheezing observed by husband, not perceived by patient - No fever - Sinuses feel very heavy - Increased drainage in the morning and late at night  Bladder discomfort and urinary frequency - Sensation of heaviness and pain when bladder is full - No dysuria - Increased urinary frequency, especially nocturia (getting up more than once or twice at night) - Duration of symptoms: past three to four days - No diarrhea or constipation - No current medication for bladder symptoms - Increased water intake  Lower back pain - Lower back pain present - Uncertain if related to bladder symptoms   ROS     Objective:    BP 131/71   Pulse 70   Temp 98.1 F (36.7 C) (Oral)   Ht 5' 5 (1.651 m)   Wt 167 lb 6.4 oz (75.9 kg)   SpO2 95%   BMI 27.86 kg/m    Physical Exam Constitutional:      Appearance: Normal appearance.  HENT:     Head: Normocephalic and atraumatic.     Right Ear: Tympanic membrane, ear canal and external ear normal. There is no impacted cerumen.     Left Ear: Tympanic  membrane, ear canal and external ear normal. There is no impacted cerumen.     Nose: Nose normal.     Mouth/Throat:     Pharynx: Oropharynx is clear.  Eyes:     Conjunctiva/sclera: Conjunctivae normal.  Cardiovascular:     Rate and Rhythm: Normal rate and regular rhythm.  Pulmonary:     Effort: Pulmonary effort is normal.     Breath sounds: Normal breath sounds.  Musculoskeletal:     Cervical back: Neck supple. No tenderness.  Lymphadenopathy:     Cervical: No cervical adenopathy.  Skin:    General: Skin is warm and dry.  Neurological:     Mental Status: She is alert and oriented to person, place, and time.  Psychiatric:        Mood and Affect: Mood normal.       Results for orders placed or performed in visit on 11/27/23  POCT URINALYSIS DIP (CLINITEK)  Result Value Ref Range   Color, UA yellow yellow   Clarity, UA clear clear   Glucose, UA negative negative mg/dL   Bilirubin, UA negative negative   Ketones, POC UA negative negative mg/dL   Spec Grav, UA 8.984 8.989 - 1.025   Blood, UA negative negative   pH, UA 7.0 5.0 - 8.0   POC PROTEIN,UA negative negative, trace   Urobilinogen, UA 0.2 0.2 or 1.0 E.U./dL  Nitrite, UA Negative Negative   Leukocytes, UA Negative Negative       Assessment & Plan:   Problem List Items Addressed This Visit   None Visit Diagnoses       Sinobronchitis    -  Primary   Relevant Medications   doxycycline  (VIBRA -TABS) 100 MG tablet   predniSONE  (DELTASONE ) 20 MG tablet     Bladder pain       Relevant Orders   POCT URINALYSIS DIP (CLINITEK) (Completed)   Urine Culture     Encounter for immunization       Relevant Orders   Flu vaccine HIGH DOSE PF(Fluzone Trivalent) (Completed)       Assessment and Plan Assessment & Plan Acute maxillary sinusitis Symptoms of drainage, sore throat, and heavy sinuses for one week, likely exacerbated by poor air quality. No fever reported. - Prescribed doxycycline  due to penicillin  allergy.  Acute exacerbation of COPD with wheezing Wheezing and increased chest congestion, likely exacerbated by sinusitis and poor air quality. No fever reported. - Prescribed prednisone  to reduce chest inflammation. - Advised use of albuterol  inhaler twice daily, with additional doses as needed. - Call if not improving.   Bladder symptoms under evaluation Increased urinary frequency and lower back pain for the last three to four days. Discomfort reported with bladder fullness. Differential includes urinary tract infection or other urinary pathology. - Obtained urine sample for analysis to check for infection, blood, or protein. - UA is normal, will send for culture     Meds ordered this encounter  Medications   doxycycline  (VIBRA -TABS) 100 MG tablet    Sig: Take 1 tablet (100 mg total) by mouth 2 (two) times daily.    Dispense:  14 tablet    Refill:  0   predniSONE  (DELTASONE ) 20 MG tablet    Sig: Take 2 tablets (40 mg total) by mouth daily with breakfast.    Dispense:  10 tablet    Refill:  0    Return if symptoms worsen or fail to improve.  Dorothyann Byars, MD [facility] at [facility]

## 2023-11-30 LAB — URINE CULTURE

## 2023-12-01 ENCOUNTER — Ambulatory Visit: Payer: Self-pay | Admitting: Family Medicine

## 2023-12-01 NOTE — Progress Notes (Signed)
 Hi Yvonne, urine culture came back negative just showed a skin bacteria but nothing significant.  Hopefully you are feeling better but if not then please let me know.

## 2024-01-07 ENCOUNTER — Other Ambulatory Visit: Payer: Self-pay | Admitting: Family Medicine

## 2024-01-07 DIAGNOSIS — I10 Essential (primary) hypertension: Secondary | ICD-10-CM

## 2024-01-13 ENCOUNTER — Other Ambulatory Visit: Payer: Self-pay | Admitting: Family Medicine

## 2024-01-13 DIAGNOSIS — M1612 Unilateral primary osteoarthritis, left hip: Secondary | ICD-10-CM

## 2024-01-17 ENCOUNTER — Other Ambulatory Visit: Payer: Self-pay | Admitting: Family Medicine

## 2024-02-15 ENCOUNTER — Other Ambulatory Visit: Payer: Self-pay | Admitting: Family Medicine

## 2024-02-15 DIAGNOSIS — J439 Emphysema, unspecified: Secondary | ICD-10-CM
